# Patient Record
Sex: Female | Born: 1950 | Race: White | Hispanic: No | Smoking: Former smoker
Health system: Southern US, Community
[De-identification: ages and names within clinical notes are randomized; demographics above are authoritative.]

## PROBLEM LIST (undated history)

## (undated) DIAGNOSIS — K219 Gastro-esophageal reflux disease without esophagitis: Secondary | ICD-10-CM

## (undated) DIAGNOSIS — K5790 Diverticulosis of intestine, part unspecified, without perforation or abscess without bleeding: Secondary | ICD-10-CM

## (undated) DIAGNOSIS — T7840XA Allergy, unspecified, initial encounter: Secondary | ICD-10-CM

## (undated) DIAGNOSIS — Z8719 Personal history of other diseases of the digestive system: Secondary | ICD-10-CM

## (undated) DIAGNOSIS — Z9889 Other specified postprocedural states: Secondary | ICD-10-CM

## (undated) DIAGNOSIS — I10 Essential (primary) hypertension: Secondary | ICD-10-CM

## (undated) DIAGNOSIS — M1990 Unspecified osteoarthritis, unspecified site: Secondary | ICD-10-CM

## (undated) DIAGNOSIS — I00 Rheumatic fever without heart involvement: Secondary | ICD-10-CM

## (undated) DIAGNOSIS — R011 Cardiac murmur, unspecified: Secondary | ICD-10-CM

## (undated) DIAGNOSIS — G43909 Migraine, unspecified, not intractable, without status migrainosus: Secondary | ICD-10-CM

## (undated) DIAGNOSIS — R112 Nausea with vomiting, unspecified: Secondary | ICD-10-CM

## (undated) DIAGNOSIS — C4491 Basal cell carcinoma of skin, unspecified: Secondary | ICD-10-CM

## (undated) DIAGNOSIS — I701 Atherosclerosis of renal artery: Secondary | ICD-10-CM

## (undated) DIAGNOSIS — K589 Irritable bowel syndrome without diarrhea: Secondary | ICD-10-CM

## (undated) DIAGNOSIS — M810 Age-related osteoporosis without current pathological fracture: Secondary | ICD-10-CM

## (undated) HISTORY — DX: Rheumatic fever without heart involvement: I00

## (undated) HISTORY — PX: HERNIA REPAIR: SHX51

## (undated) HISTORY — PX: PARATHYROIDECTOMY: SHX19

## (undated) HISTORY — PX: TUBAL LIGATION: SHX77

## (undated) HISTORY — DX: Cardiac murmur, unspecified: R01.1

## (undated) HISTORY — PX: OTHER SURGICAL HISTORY: SHX169

## (undated) HISTORY — DX: Migraine, unspecified, not intractable, without status migrainosus: G43.909

## (undated) HISTORY — DX: Personal history of other diseases of the digestive system: Z87.19

## (undated) HISTORY — PX: SKIN CANCER EXCISION: SHX779

## (undated) HISTORY — DX: Gastro-esophageal reflux disease without esophagitis: K21.9

## (undated) HISTORY — DX: Basal cell carcinoma of skin, unspecified: C44.91

## (undated) HISTORY — DX: Unspecified osteoarthritis, unspecified site: M19.90

## (undated) HISTORY — DX: Diverticulosis of intestine, part unspecified, without perforation or abscess without bleeding: K57.90

## (undated) HISTORY — PX: COLONOSCOPY: SHX174

## (undated) HISTORY — DX: Allergy, unspecified, initial encounter: T78.40XA

## (undated) HISTORY — DX: Other specified postprocedural states: Z98.890

## (undated) HISTORY — PX: DILATION AND CURETTAGE, DIAGNOSTIC / THERAPEUTIC: SUR384

## (undated) HISTORY — PX: DILATION AND CURETTAGE OF UTERUS: SHX78

## (undated) HISTORY — PX: CATARACT EXTRACTION, BILATERAL: SHX1313

## (undated) HISTORY — DX: Irritable bowel syndrome, unspecified: K58.9

## (undated) HISTORY — PX: KNEE SURGERY: SHX244

## (undated) HISTORY — PX: TOE SURGERY: SHX1073

## (undated) HISTORY — PX: UMBILICAL HERNIA REPAIR: SHX196

## (undated) HISTORY — DX: Age-related osteoporosis without current pathological fracture: M81.0

## (undated) HISTORY — PX: UPPER GI ENDOSCOPY: SHX6162

---

## 1980-05-09 HISTORY — PX: APPENDECTOMY: SHX54

## 2009-05-27 LAB — HM COLONOSCOPY

## 2011-05-19 ENCOUNTER — Encounter: Payer: Self-pay | Admitting: Physician Assistant

## 2011-06-10 ENCOUNTER — Encounter: Payer: Self-pay | Admitting: Physician Assistant

## 2012-01-05 ENCOUNTER — Ambulatory Visit: Payer: Self-pay

## 2012-01-18 ENCOUNTER — Ambulatory Visit: Payer: Self-pay | Admitting: Internal Medicine

## 2012-10-02 ENCOUNTER — Encounter: Payer: Self-pay | Admitting: Gynecology

## 2012-10-02 DIAGNOSIS — M8580 Other specified disorders of bone density and structure, unspecified site: Secondary | ICD-10-CM | POA: Insufficient documentation

## 2012-10-05 ENCOUNTER — Ambulatory Visit (INDEPENDENT_AMBULATORY_CARE_PROVIDER_SITE_OTHER): Payer: BC Managed Care – PPO | Admitting: Gynecology

## 2012-10-05 VITALS — BP 122/80 | Resp 14 | Wt 131.0 lb

## 2012-10-05 DIAGNOSIS — M810 Age-related osteoporosis without current pathological fracture: Secondary | ICD-10-CM

## 2012-10-05 DIAGNOSIS — Z01419 Encounter for gynecological examination (general) (routine) without abnormal findings: Secondary | ICD-10-CM

## 2012-10-05 DIAGNOSIS — Z124 Encounter for screening for malignant neoplasm of cervix: Secondary | ICD-10-CM

## 2012-10-05 DIAGNOSIS — N952 Postmenopausal atrophic vaginitis: Secondary | ICD-10-CM

## 2012-10-05 NOTE — Progress Notes (Signed)
62 y.o.   Widowed    Caucasian   female   [id number]   here for annual exam.   Pt here for annual, started effexor for hot flashes but discontinued due to side effects.  Hot flashes tolerable, excercises now include core strength.  Happy with estring, itching better.  No LMP recorded.          Sexually active: no  The current method of family planning is none.    Exercising: regular Last mammogram:  2013 Last pap smear: 2013 History of abnormal pap: no Smoking: no Alcohol:no Last colonoscopy: 2011 Last Bone Density:  2013 Last tetanus shot: unk Last cholesterol check: 2013 BSE: yes  Hgb:                Urine:   Family History  Problem Relation Age of Onset  . Diabetes Mother   . Breast cancer Mother   . Hypertension Mother   . Stroke Mother     Late 70  . Hypertension Father     Patient Active Problem List   Diagnosis Date Noted  . Osteoporosis     Past Medical History  Diagnosis Date  . Osteoporosis     Past Surgical History  Procedure Laterality Date  . Appendectomy  1982  . Umbilical hernia repair    . Tubal ligation    . Dilation and curettage of uterus      x2    Allergies: Codeine and Penicillins  Current Outpatient Prescriptions  Medication Sig Dispense Refill  . Calcium Carbonate (CALTRATE 600 PO) Take by mouth.      . dicyclomine (BENTYL) 10 MG capsule Take 10 mg by mouth 4 (four) times daily -  before meals and at bedtime.      Marland Kitchen estradiol (ESTRING) 2 MG vaginal ring Place 2 mg vaginally every 3 (three) months. follow package directions      . IBUPROFEN PO Take by mouth.      . Multiple Vitamins-Minerals (ALIVE WOMENS 50+ PO) Take by mouth.      . Omega-3 Fatty Acids (FISH OIL PO) Take by mouth.      . Probiotic Product (PROBIOTIC DAILY PO) Take by mouth.      Marland Kitchen UNABLE TO FIND Med Name: Tumeric       No current facility-administered medications for this visit.    ROS: Pertinent items are noted in HPI.  Social Hx:    Exam:    BP 122/80   Resp 14  Wt 131 lb (59.421 kg)   Wt Readings from Last 3 Encounters:  10/05/12 131 lb (59.421 kg)     Ht Readings from Last 3 Encounters:  No data found for Ht    General appearance: alert, cooperative and appears stated age Head: Normocephalic, without obvious abnormality, atraumatic Neck: no adenopathy, supple, symmetrical, trachea midline and thyroid not enlarged, symmetric, no tenderness/mass/nodules Lungs: clear to auscultation bilaterally Breasts: Inspection negative, No nipple retraction or dimpling, No nipple discharge or bleeding, No axillary or supraclavicular adenopathy, Normal to palpation without dominant masses Heart: regular rate and rhythm Abdomen: soft, non-tender; bowel sounds normal; no masses,  no organomegaly Extremities: extremities normal, atraumatic, no cyanosis or edema Skin: Skin color, texture, turgor normal. No rashes or lesions Lymph nodes: Cervical, supraclavicular, and axillary nodes normal. No abnormal inguinal nodes palpated Neurologic: Grossly normal   Pelvic: External genitalia:  no lesions              Urethra:  normal appearing urethra  with no masses, tenderness or lesions              Bartholins and Skenes: normal                 Vagina: normal appearing vagina with normal color and discharge, no lesions              Cervix: normal appearance              Pap taken: yes        Bimanual Exam:  Uterus:  uterus is normal size, shape, consistency and nontender                                      Adnexa: normal adnexa in size, nontender and no masses                                      Rectovaginal: Confirms                                      Anus:  normal sphincter tone, no lesions  A: normal menopausal exam Atrophic vaginitis     P: mammogram qyr pap smear with HRHPV, will hold to get results from [facility] discussed guideline changes,  will continue weight bearing and core exercises Rx given for estring #1x3 return annually or  prn     An After Visit Summary was printed and given to the patient.

## 2012-10-05 NOTE — Patient Instructions (Signed)

## 2012-12-21 ENCOUNTER — Ambulatory Visit (INDEPENDENT_AMBULATORY_CARE_PROVIDER_SITE_OTHER): Payer: BC Managed Care – PPO | Admitting: Internal Medicine

## 2012-12-21 ENCOUNTER — Encounter: Payer: Self-pay | Admitting: Internal Medicine

## 2012-12-21 VITALS — BP 120/80 | HR 65 | Temp 98.4°F | Ht 65.5 in | Wt 137.2 lb

## 2012-12-21 DIAGNOSIS — K5790 Diverticulosis of intestine, part unspecified, without perforation or abscess without bleeding: Secondary | ICD-10-CM

## 2012-12-21 DIAGNOSIS — M810 Age-related osteoporosis without current pathological fracture: Secondary | ICD-10-CM

## 2012-12-21 DIAGNOSIS — M1990 Unspecified osteoarthritis, unspecified site: Secondary | ICD-10-CM

## 2012-12-21 DIAGNOSIS — K5730 Diverticulosis of large intestine without perforation or abscess without bleeding: Secondary | ICD-10-CM

## 2012-12-21 DIAGNOSIS — K589 Irritable bowel syndrome without diarrhea: Secondary | ICD-10-CM

## 2012-12-21 DIAGNOSIS — Z9109 Other allergy status, other than to drugs and biological substances: Secondary | ICD-10-CM

## 2012-12-21 DIAGNOSIS — K219 Gastro-esophageal reflux disease without esophagitis: Secondary | ICD-10-CM

## 2012-12-21 DIAGNOSIS — M129 Arthropathy, unspecified: Secondary | ICD-10-CM

## 2012-12-21 DIAGNOSIS — G43909 Migraine, unspecified, not intractable, without status migrainosus: Secondary | ICD-10-CM

## 2012-12-21 DIAGNOSIS — Z13220 Encounter for screening for lipoid disorders: Secondary | ICD-10-CM

## 2012-12-23 ENCOUNTER — Encounter: Payer: Self-pay | Admitting: Internal Medicine

## 2012-12-23 DIAGNOSIS — M1990 Unspecified osteoarthritis, unspecified site: Secondary | ICD-10-CM | POA: Insufficient documentation

## 2012-12-23 DIAGNOSIS — K219 Gastro-esophageal reflux disease without esophagitis: Secondary | ICD-10-CM | POA: Insufficient documentation

## 2012-12-23 DIAGNOSIS — K589 Irritable bowel syndrome without diarrhea: Secondary | ICD-10-CM | POA: Insufficient documentation

## 2012-12-23 DIAGNOSIS — M069 Rheumatoid arthritis, unspecified: Secondary | ICD-10-CM | POA: Insufficient documentation

## 2012-12-23 DIAGNOSIS — K5790 Diverticulosis of intestine, part unspecified, without perforation or abscess without bleeding: Secondary | ICD-10-CM | POA: Insufficient documentation

## 2012-12-23 DIAGNOSIS — G43909 Migraine, unspecified, not intractable, without status migrainosus: Secondary | ICD-10-CM | POA: Insufficient documentation

## 2012-12-23 DIAGNOSIS — Z9109 Other allergy status, other than to drugs and biological substances: Secondary | ICD-10-CM | POA: Insufficient documentation

## 2012-12-23 NOTE — Assessment & Plan Note (Signed)
Stable.  No symptoms now.  Follow.   

## 2012-12-23 NOTE — Assessment & Plan Note (Signed)
Pain at the base of her thumb.  (right).  Has tried conservative measures including a splint, tylenol and antiinflammatories.  Not helping.  Has seen ortho.  Had xrays.  Told bone on bone.  Needs surgery.  Desires no further intervention at this time.  Follow.

## 2012-12-23 NOTE — Assessment & Plan Note (Signed)
Bowels stable.  

## 2012-12-23 NOTE — Assessment & Plan Note (Signed)
Currently controlled.  Follow.  

## 2012-12-23 NOTE — Assessment & Plan Note (Signed)
On ranitidine.  Has a history of acid reflux.  S/p EGD and dilatation.  Doing better.  Follow.   

## 2012-12-23 NOTE — Progress Notes (Signed)
Subjective:    Patient ID: Victoria Moss, female    DOB: Oct 08, 1950, 62 y.o.   MRN: [id number]  HPI 62 year old female with past history of GERD, arthritis, rheumatic fever and allergies.  She comes in today to follow up on these issues as well as to establish care.  Former pt of Dr Vella Kohler.  Also followed by [facility] - for gyn care.  Has issues with arthritis.  Has had evaluation.  Was told - "bone on bone".  Has tried conservative measures.  Has tried splints and antiinflammatories.  No relief.  Desires no further intervention at this point.  She reports a history of reflux.  Takes ranitidine.  Helps.  Bowels stable.  Has IBS.  Also has a history of diverticulosis.  Currently doing well.  Has osteoporosis.  Discussed treatment options for osteoporosis.  She has been hesitant to take medications given possible side effects of the medications.  We discussed Reclast as a possible treatment options.  She is due a follow up bone density.  Wants to wait and see what her bone density shows - prior to deciding.  She has also had and EGD and is s/p dilatation.  Swallowing better.  Is up to date with her pelvic/paps and mammograms.     Past Medical History  Diagnosis Date  . Osteoporosis   . Arthritis   . GERD (gastroesophageal reflux disease)   . Heart murmur   . Migraine     H/O, none since stopping chocolate  . Allergy   . Diverticulitis     H/O  . Rheumatic fever     H/O  . Basal cell cancer      Outpatient Encounter Prescriptions as of 12/21/2012  Medication Sig Dispense Refill  . Calcium Carbonate (CALTRATE 600 PO) Take by mouth.      . dicyclomine (BENTYL) 10 MG capsule Take 10 mg by mouth 4 (four) times daily -  before meals and at bedtime.      Marland Kitchen estradiol (ESTRING) 2 MG vaginal ring Place 2 mg vaginally every 3 (three) months. follow package directions      . IBUPROFEN PO Take by mouth.      . Multiple Vitamins-Minerals (ALIVE WOMENS 50+ PO) Take by mouth.      .  Omega-3 Fatty Acids (FISH OIL PO) Take by mouth.      . Probiotic Product (PROBIOTIC DAILY PO) Take by mouth.      . ranitidine (ZANTAC) 150 MG tablet Take 150 mg by mouth at bedtime.      Marland Kitchen UNABLE TO FIND Med Name: Tumeric       No facility-administered encounter medications on file as of 12/21/2012.    Review of Systems Patient denies any headache, lightheadedness or dizziness.  No significant sinus or allergy symptoms.  No chest pain, tightness or palpitations.  No increased shortness of breath, cough or congestion.  No nausea or vomiting.  Takes ranitidine for her acid reflux.  Her swallowing is better after dilatation.  No abdominal pain or cramping.  No bowel change.  Has IBS.  Stable.  No BRBPR or melana.  Has had colonoscopy.  No urine change.  Discussed osteoporosis and treatment options as outlined.        Objective:   Physical Exam Filed Vitals:   12/21/12 1528  BP: 120/80  Pulse: 65  Temp: 98.4 F (65.98 C)   62 year old female in no acute distress.   HEENT:  Nares- clear.  Oropharynx - without lesions. NECK:  Supple.  Nontender.  No audible bruit.  HEART:  Appears to be regular. LUNGS:  No crackles or wheezing audible.  Respirations even and unlabored.  RADIAL PULSE:  Equal bilaterally.    BREASTS:  Performed by gyn.   ABDOMEN:  Soft, nontender.  Bowel sounds present and normal.  No audible abdominal bruit.  GU:  [erfpr,ed bu gum/    EXTREMITIES:  No increased edema present.  DP pulses palpable and equal bilaterally.          Assessment & Plan:  HEALTH MAINTENANCE.  Up to date with her pelvic/paps and breast/mammograms.  Followed by gyn.  See records.    I spent 45 minutes with the patient and more than 50% of the time was spent in consultation regarding the above.

## 2012-12-23 NOTE — Assessment & Plan Note (Signed)
Not an issue for her since she stopped chocolate.    

## 2012-12-23 NOTE — Assessment & Plan Note (Signed)
States is due a bone density.  Plans to schedule.  Discussed treatment options.  Discussed reclast (especially given her GI issues).  Wants to wait and review bone density prior to starting.

## 2012-12-26 ENCOUNTER — Telehealth: Payer: Self-pay | Admitting: Internal Medicine

## 2012-12-26 ENCOUNTER — Other Ambulatory Visit (INDEPENDENT_AMBULATORY_CARE_PROVIDER_SITE_OTHER): Payer: BC Managed Care – PPO

## 2012-12-26 DIAGNOSIS — Z13220 Encounter for screening for lipoid disorders: Secondary | ICD-10-CM

## 2012-12-26 DIAGNOSIS — M1990 Unspecified osteoarthritis, unspecified site: Secondary | ICD-10-CM

## 2012-12-26 DIAGNOSIS — K219 Gastro-esophageal reflux disease without esophagitis: Secondary | ICD-10-CM

## 2012-12-26 DIAGNOSIS — M810 Age-related osteoporosis without current pathological fracture: Secondary | ICD-10-CM

## 2012-12-26 DIAGNOSIS — M129 Arthropathy, unspecified: Secondary | ICD-10-CM

## 2012-12-26 LAB — LIPID PANEL
Cholesterol: 170 mg/dL (ref 0–200)
HDL: 63.8 mg/dL (ref >39.00)
LDL Cholesterol: 86 mg/dL (ref 0–99)
Triglycerides: 100 mg/dL (ref 0.0–149.0)
VLDL: 20 mg/dL (ref 0.0–40.0)

## 2012-12-26 LAB — CBC WITH DIFFERENTIAL/PLATELET
Basophils Absolute: 0 10*3/uL (ref 0.0–0.1)
Basophils Relative: 0.5 % (ref 0.0–3.0)
Eosinophils Absolute: 0.2 10*3/uL (ref 0.0–0.7)
Hemoglobin: 13 g/dL (ref 12.0–15.0)
MCHC: 34.2 g/dL (ref 30.0–36.0)
MCV: 91.3 fl (ref 78.0–100.0)
Monocytes Absolute: 0.3 10*3/uL (ref 0.1–1.0)
Neutro Abs: 2.5 10*3/uL (ref 1.4–7.7)
Neutrophils Relative %: 61 % (ref 43.0–77.0)
RBC: 4.17 Mil/uL (ref 3.87–5.11)
RDW: 13.1 % (ref 11.5–14.6)

## 2012-12-26 LAB — COMPREHENSIVE METABOLIC PANEL
AST: 19 U/L (ref 0–37)
Alkaline Phosphatase: 86 U/L (ref 39–117)
BUN: 11 mg/dL (ref 6–23)
Creatinine, Ser: 0.7 mg/dL (ref 0.4–1.2)
Total Bilirubin: 0.7 mg/dL (ref 0.3–1.2)

## 2012-12-26 NOTE — Telephone Encounter (Signed)
Okay to fill? Pt seen on 8/15 (new patient)

## 2012-12-26 NOTE — Telephone Encounter (Signed)
Pt is needing refill on Dicyclomine 10 mg capsules 3 month supply. Pt needs it hand written and mailed to her if possible.

## 2012-12-26 NOTE — Telephone Encounter (Signed)
Pt states that she typically takes about 20-30 per month. But she gets a 90 supply through a health warehouse at a discounted price (she has no prescription plan). Would like to pick up a Rx on Tuesday (If that is okay-no need to call patient back)

## 2012-12-26 NOTE — Telephone Encounter (Signed)
How often does she take this?  I was under the impression this was just prn.  I just need to know, so that I will know how many to give her.  Thanks.

## 2012-12-30 MED ORDER — DICYCLOMINE HCL 10 MG PO CAPS: ORAL_CAPSULE | ORAL | Status: DC

## 2012-12-30 NOTE — Addendum Note (Signed)
Addended by: Charm Barges on: 12/30/2012 05:22 PM   Modules accepted: Orders

## 2012-12-30 NOTE — Telephone Encounter (Signed)
Refilled dicyclomine #90 with one refill.   rx in your basket

## 2012-12-31 NOTE — Telephone Encounter (Signed)
Rx placed up front for pt to p/u on Tuesday

## 2013-01-01 ENCOUNTER — Encounter: Payer: Self-pay | Admitting: *Deleted

## 2013-01-02 ENCOUNTER — Telehealth: Payer: Self-pay | Admitting: Gynecology

## 2013-01-02 NOTE — Telephone Encounter (Signed)
Spoke to pt about last MMG. Pt had it done in [place] last year, but she cannot remember exactly when. Pt prefers to have it done in [place]. Advised pt to call and see when her last appt was and make her new appt at least 365 days from the last one for insurance purposes. Pt will have results sent to Dr. Farrel Gobble. Pt will call back if she has problems making appt.

## 2013-01-02 NOTE — Telephone Encounter (Signed)
Patient received letter stating that it was time for her scheduled mammogram. Please call to let her know when and where to go .

## 2013-01-18 ENCOUNTER — Ambulatory Visit: Payer: Self-pay

## 2013-01-18 LAB — HM MAMMOGRAPHY: HM Mammogram: NEGATIVE

## 2013-02-04 ENCOUNTER — Ambulatory Visit (INDEPENDENT_AMBULATORY_CARE_PROVIDER_SITE_OTHER): Payer: BC Managed Care – PPO

## 2013-02-04 DIAGNOSIS — Z23 Encounter for immunization: Secondary | ICD-10-CM

## 2013-03-19 ENCOUNTER — Encounter: Payer: Self-pay | Admitting: Adult Health

## 2013-03-19 ENCOUNTER — Ambulatory Visit (INDEPENDENT_AMBULATORY_CARE_PROVIDER_SITE_OTHER): Payer: BC Managed Care – PPO | Admitting: Adult Health

## 2013-03-19 VITALS — BP 138/80 | HR 76 | Resp 16 | Wt 138.0 lb

## 2013-03-19 DIAGNOSIS — M542 Cervicalgia: Secondary | ICD-10-CM | POA: Insufficient documentation

## 2013-03-19 MED ORDER — CYCLOBENZAPRINE HCL 5 MG PO TABS: 5.0000 mg | ORAL_TABLET | Freq: Three times a day (TID) | ORAL | Status: DC | PRN

## 2013-03-19 NOTE — Progress Notes (Signed)
Pre visit review using our clinic review tool, if applicable. No additional management support is needed unless otherwise documented below in the visit note. 

## 2013-03-19 NOTE — Assessment & Plan Note (Signed)
Tight trapezius muscle on the left. ? Improper posture during sleep. Ice alternating with heat. NSAIDs x 3-4 days. Flexeril for muscle spasms. Stretching. RTC if no improvement within 1 week.

## 2013-03-19 NOTE — Patient Instructions (Signed)
  Take ibuprofen or aleve regularly for 3-4 days to reduce inflammation.  Ice alternating with heat 20 min 3-4 times daily.  Stretching exercises will also help.  Flexeril for muscle spasms. This will cause some sedation. Do not drive while taking medication.  Good support pillow.

## 2013-03-19 NOTE — Progress Notes (Signed)
  Subjective:    Patient ID: Victoria Moss, female    DOB: [birth year], 62 y.o.   MRN: [id number]  HPI  Patient is a pleasant 62 year old female who presents to clinic with pain on left side of her neck. She reports the pain began Tuesday morning when she first woke up. She thinks that she slept in a bad position. She has taken ibuprofen for inflammation; however, she has not consistently done so. Patient has started participating in yoga. She reports that this is beginners yoga and does not feel that this has contributed to her current problem.   Current Outpatient Prescriptions on File Prior to Visit  Medication Sig Dispense Refill  . Calcium Carbonate (CALTRATE 600 PO) Take by mouth.      . dicyclomine (BENTYL) 10 MG capsule One capsule q day prn  90 capsule  1  . estradiol (ESTRING) 2 MG vaginal ring Place 2 mg vaginally every 3 (three) months. follow package directions      . IBUPROFEN PO Take by mouth.      . Multiple Vitamins-Minerals (ALIVE WOMENS 50+ PO) Take by mouth.      . Omega-3 Fatty Acids (FISH OIL PO) Take by mouth.      . Probiotic Product (PROBIOTIC DAILY PO) Take by mouth.      . ranitidine (ZANTAC) 150 MG tablet Take 150 mg by mouth at bedtime.      Marland Kitchen UNABLE TO FIND Med Name: Tumeric       No current facility-administered medications on file prior to visit.     Review of Systems  Musculoskeletal: Positive for neck pain.       Left side neck and shoulder tightness, pain with turning head.       Objective:   Physical Exam  Constitutional: She is oriented to person, place, and time.  Musculoskeletal: She exhibits tenderness. She exhibits no edema.  Tightness of left side trapezius muscle from neck to shoulder  Neurological: She is alert and oriented to person, place, and time.          Assessment & Plan:

## 2013-05-16 ENCOUNTER — Encounter (INDEPENDENT_AMBULATORY_CARE_PROVIDER_SITE_OTHER): Payer: Self-pay

## 2013-05-16 ENCOUNTER — Other Ambulatory Visit: Payer: Self-pay | Admitting: Internal Medicine

## 2013-05-16 ENCOUNTER — Ambulatory Visit (INDEPENDENT_AMBULATORY_CARE_PROVIDER_SITE_OTHER): Payer: BC Managed Care – PPO | Admitting: Internal Medicine

## 2013-05-16 ENCOUNTER — Encounter: Payer: Self-pay | Admitting: Internal Medicine

## 2013-05-16 VITALS — BP 122/82 | HR 68 | Temp 98.1°F | Ht 65.5 in | Wt 139.8 lb

## 2013-05-16 DIAGNOSIS — M810 Age-related osteoporosis without current pathological fracture: Secondary | ICD-10-CM

## 2013-05-16 DIAGNOSIS — K5790 Diverticulosis of intestine, part unspecified, without perforation or abscess without bleeding: Secondary | ICD-10-CM

## 2013-05-16 DIAGNOSIS — Z9109 Other allergy status, other than to drugs and biological substances: Secondary | ICD-10-CM

## 2013-05-16 DIAGNOSIS — K589 Irritable bowel syndrome without diarrhea: Secondary | ICD-10-CM

## 2013-05-16 DIAGNOSIS — M1990 Unspecified osteoarthritis, unspecified site: Secondary | ICD-10-CM

## 2013-05-16 DIAGNOSIS — K5730 Diverticulosis of large intestine without perforation or abscess without bleeding: Secondary | ICD-10-CM

## 2013-05-16 DIAGNOSIS — D72819 Decreased white blood cell count, unspecified: Secondary | ICD-10-CM

## 2013-05-16 DIAGNOSIS — K219 Gastro-esophageal reflux disease without esophagitis: Secondary | ICD-10-CM

## 2013-05-16 DIAGNOSIS — M129 Arthropathy, unspecified: Secondary | ICD-10-CM

## 2013-05-16 DIAGNOSIS — G43909 Migraine, unspecified, not intractable, without status migrainosus: Secondary | ICD-10-CM

## 2013-05-16 LAB — CBC WITH DIFFERENTIAL/PLATELET
BASOS ABS: 0 10*3/uL (ref 0.0–0.1)
Basophils Relative: 0.4 % (ref 0.0–3.0)
EOS ABS: 0.3 10*3/uL (ref 0.0–0.7)
Eosinophils Relative: 4.3 % (ref 0.0–5.0)
HCT: 36.7 % (ref 36.0–46.0)
Hemoglobin: 12.6 g/dL (ref 12.0–15.0)
Lymphocytes Relative: 24.2 % (ref 12.0–46.0)
Lymphs Abs: 1.5 10*3/uL (ref 0.7–4.0)
MCHC: 34.4 g/dL (ref 30.0–36.0)
MCV: 90.2 fl (ref 78.0–100.0)
MONO ABS: 0.3 10*3/uL (ref 0.1–1.0)
Monocytes Relative: 4.9 % (ref 3.0–12.0)
NEUTROS PCT: 66.2 % (ref 43.0–77.0)
Neutro Abs: 4.1 10*3/uL (ref 1.4–7.7)
PLATELETS: 281 10*3/uL (ref 150.0–400.0)
RBC: 4.07 Mil/uL (ref 3.87–5.11)
RDW: 12.4 % (ref 11.5–14.6)
WBC: 6.2 10*3/uL (ref 4.5–10.5)

## 2013-05-16 NOTE — Assessment & Plan Note (Addendum)
States is due a bone density.  Discussed treatment options.  Discussed reclast (especially given her GI issues).  Schedule bone density.

## 2013-05-16 NOTE — Assessment & Plan Note (Addendum)
Exercising.  Yoga.  Desires no further intervention.    

## 2013-05-16 NOTE — Assessment & Plan Note (Signed)
Bowels stable.  

## 2013-05-16 NOTE — Progress Notes (Signed)
Pre-visit discussion using our clinic review tool. No additional management support is needed unless otherwise documented below in the visit note.  

## 2013-05-16 NOTE — Assessment & Plan Note (Addendum)
On ranitidine.  Has a history of acid reflux.  S/p EGD and dilatation.  Doing better.  Follow.   

## 2013-05-16 NOTE — Progress Notes (Signed)
Subjective:    Patient ID: Victoria Moss, female    DOB: [birth year], 63 y.o.   MRN: [id number]  HPI 63 year old female with past history of GERD, arthritis, rheumatic fever and allergies.  She comes in today for a scheduled follow up.   Has been followed by [facility] - for gyn care.  Has issues with arthritis.  Has had evaluation.  Was told - "bone on bone".  Has tried conservative measures.  Has tried splints and antiinflammatories.  No relief.  Desires no further intervention at this point.  Has started a yoga class.  Exercises three days per week.  She reports a history of reflux.  Takes ranitidine.  Helps.  Bowels stable.  Has IBS.  Also has a history of diverticulosis.  Currently doing well.  Has osteoporosis.  Discussed treatment options for osteoporosis.  She has been hesitant to take medications given possible side effects of the medications.  We discussed Reclast as a possible treatment options.  She is due a follow up bone density.  She has also had and EGD and is s/p dilatation.  Swallowing better.  Is up to date with her pelvic/paps and mammograms.   Previously used Estring.  Felt better.  Off now.  Does not feel needs to be back on estrogen.  Had colonoscopy four years ago..    Past Medical History  Diagnosis Date  . Osteoporosis   . Arthritis   . GERD (gastroesophageal reflux disease)   . Heart murmur   . Migraine     H/O, none since stopping chocolate  . Allergy   . Diverticulitis     H/O  . Rheumatic fever     H/O  . Basal cell cancer      Outpatient Encounter Prescriptions as of 05/16/2013  Medication Sig  . Calcium Carbonate (CALTRATE 600 PO) Take by mouth.  . dicyclomine (BENTYL) 10 MG capsule One capsule q day prn  . IBUPROFEN PO Take by mouth.  . Multiple Vitamins-Minerals (ALIVE WOMENS 50+ PO) Take by mouth.  . Omega-3 Fatty Acids (FISH OIL PO) Take by mouth.  . Probiotic Product (PROBIOTIC DAILY PO) Take by mouth.  . ranitidine (ZANTAC) 150 MG tablet Take  150 mg by mouth at bedtime.  Marland Kitchen UNABLE TO FIND Med Name: Tumeric  . [DISCONTINUED] cyclobenzaprine (FLEXERIL) 5 MG tablet Take 1 tablet (5 mg total) by mouth 3 (three) times daily as needed for muscle spasms.  . [DISCONTINUED] estradiol (ESTRING) 2 MG vaginal ring Place 2 mg vaginally every 3 (three) months. follow package directions    Review of Systems Patient denies any headache, lightheadedness or dizziness.  No significant sinus or allergy symptoms.  No chest pain, tightness or palpitations.  No increased shortness of breath, cough or congestion.  No nausea or vomiting.  Takes ranitidine for her acid reflux.  Her swallowing is better after dilatation.  No abdominal pain or cramping.  No bowel change.  Has IBS.  Stable.  No BRBPR or melana.  Has had colonoscopy.  No urine change.  Discussed osteoporosis and treatment options as outlined.   Exercising.  Going to a yoga class.  Overall she feels she is doing relatively well.      Objective:   Physical Exam  Filed Vitals:   05/16/13 1406  BP: 122/82  Pulse: 68  Temp: 98.1 F (26.29 C)   63 year old female in no acute distress.   HEENT:  Nares- clear.  Oropharynx - without lesions. NECK:  Supple.  Nontender.  No audible bruit.  HEART:  Appears to be regular. LUNGS:  No crackles or wheezing audible.  Respirations even and unlabored.  RADIAL PULSE:  Equal bilaterally.   ABDOMEN:  Soft, nontender.  Bowel sounds present and normal.  No audible abdominal bruit.    EXTREMITIES:  No increased edema present.  DP pulses palpable and equal bilaterally.          Assessment & Plan:  HEALTH MAINTENANCE.  Up to date with her pelvic/paps and breast/mammograms.  Followed by gyn.  See records.    I spent 25 minutes with the patient and more than 50% of the time was spent in consultation regarding the above.

## 2013-05-16 NOTE — Progress Notes (Signed)
Order placed for f/u cbc.   

## 2013-05-16 NOTE — Assessment & Plan Note (Addendum)
Stable.  No symptoms now.  Follow.   

## 2013-05-17 ENCOUNTER — Encounter: Payer: Self-pay | Admitting: *Deleted

## 2013-05-17 ENCOUNTER — Telehealth: Payer: Self-pay | Admitting: *Deleted

## 2013-05-17 NOTE — Telephone Encounter (Signed)
Mailed lab results. 

## 2013-05-19 ENCOUNTER — Encounter: Payer: Self-pay | Admitting: Internal Medicine

## 2013-05-19 DIAGNOSIS — D72819 Decreased white blood cell count, unspecified: Secondary | ICD-10-CM | POA: Insufficient documentation

## 2013-05-19 NOTE — Assessment & Plan Note (Signed)
Recheck cbc today.   

## 2013-05-19 NOTE — Assessment & Plan Note (Signed)
Not an issue for her since she stopped chocolate.    

## 2013-05-19 NOTE — Assessment & Plan Note (Signed)
Currently controlled.  Follow.  

## 2013-07-02 ENCOUNTER — Encounter: Payer: Self-pay | Admitting: Internal Medicine

## 2013-07-02 ENCOUNTER — Telehealth: Payer: Self-pay | Admitting: Internal Medicine

## 2013-07-02 DIAGNOSIS — Z83719 Family history of colon polyps, unspecified: Secondary | ICD-10-CM

## 2013-07-02 DIAGNOSIS — M810 Age-related osteoporosis without current pathological fracture: Secondary | ICD-10-CM

## 2013-07-02 DIAGNOSIS — K219 Gastro-esophageal reflux disease without esophagitis: Secondary | ICD-10-CM

## 2013-07-02 DIAGNOSIS — Z8371 Family history of colonic polyps: Secondary | ICD-10-CM | POA: Insufficient documentation

## 2013-07-02 NOTE — Telephone Encounter (Signed)
Notify pt that I received her bone density from [facility].  This was done on 10/04/11.  Will be due f/u bone density 5-6/15.  Also, she needs a physical scheduled in 7/15.  I will go ahead and schedule the bone density for 5-6/15.

## 2013-07-03 NOTE — Telephone Encounter (Signed)
Left detailed voicemail with information & asked pt to call back & schedule physical in July

## 2013-08-12 ENCOUNTER — Telehealth: Payer: Self-pay | Admitting: Internal Medicine

## 2013-08-12 NOTE — Telephone Encounter (Signed)
Called stating she is ready to set up bone density test.  States this was to be done in May.  Anyday, anytime after 9 a.m. Is okay.  Cannot do it the last week of May.

## 2013-08-13 NOTE — Telephone Encounter (Signed)
Order printed-arb 

## 2013-09-19 ENCOUNTER — Telehealth: Payer: Self-pay | Admitting: Internal Medicine

## 2013-10-25 ENCOUNTER — Encounter: Payer: Self-pay | Admitting: *Deleted

## 2013-10-25 LAB — HM DEXA SCAN

## 2013-10-31 ENCOUNTER — Encounter: Payer: Self-pay | Admitting: Internal Medicine

## 2013-11-13 ENCOUNTER — Ambulatory Visit (INDEPENDENT_AMBULATORY_CARE_PROVIDER_SITE_OTHER): Payer: BC Managed Care – PPO | Admitting: Internal Medicine

## 2013-11-13 ENCOUNTER — Encounter: Payer: Self-pay | Admitting: Internal Medicine

## 2013-11-13 VITALS — BP 110/70 | HR 66 | Temp 98.2°F | Ht 66.0 in | Wt 139.5 lb

## 2013-11-13 DIAGNOSIS — M810 Age-related osteoporosis without current pathological fracture: Secondary | ICD-10-CM

## 2013-11-13 DIAGNOSIS — Z8371 Family history of colonic polyps: Secondary | ICD-10-CM

## 2013-11-13 DIAGNOSIS — M25552 Pain in left hip: Secondary | ICD-10-CM | POA: Insufficient documentation

## 2013-11-13 DIAGNOSIS — G43809 Other migraine, not intractable, without status migrainosus: Secondary | ICD-10-CM

## 2013-11-13 DIAGNOSIS — Z1239 Encounter for other screening for malignant neoplasm of breast: Secondary | ICD-10-CM

## 2013-11-13 DIAGNOSIS — M129 Arthropathy, unspecified: Secondary | ICD-10-CM

## 2013-11-13 DIAGNOSIS — K589 Irritable bowel syndrome without diarrhea: Secondary | ICD-10-CM

## 2013-11-13 DIAGNOSIS — M1990 Unspecified osteoarthritis, unspecified site: Secondary | ICD-10-CM

## 2013-11-13 DIAGNOSIS — K219 Gastro-esophageal reflux disease without esophagitis: Secondary | ICD-10-CM

## 2013-11-13 DIAGNOSIS — Z9109 Other allergy status, other than to drugs and biological substances: Secondary | ICD-10-CM

## 2013-11-13 DIAGNOSIS — K5730 Diverticulosis of large intestine without perforation or abscess without bleeding: Secondary | ICD-10-CM

## 2013-11-13 DIAGNOSIS — D72819 Decreased white blood cell count, unspecified: Secondary | ICD-10-CM

## 2013-11-13 DIAGNOSIS — J029 Acute pharyngitis, unspecified: Secondary | ICD-10-CM

## 2013-11-13 LAB — URINALYSIS, ROUTINE W REFLEX MICROSCOPIC
BILIRUBIN URINE: NEGATIVE
HGB URINE DIPSTICK: NEGATIVE
Ketones, ur: NEGATIVE
Leukocytes, UA: NEGATIVE
Nitrite: NEGATIVE
RBC / HPF: NONE SEEN (ref 0–?)
Specific Gravity, Urine: <=1.005 — AB (ref 1.000–1.030)
TOTAL PROTEIN, URINE-UPE24: NEGATIVE
URINE GLUCOSE: NEGATIVE
UROBILINOGEN UA: 0.2 (ref 0.0–1.0)
WBC, UA: NONE SEEN (ref 0–?)
pH: 6.5 (ref 5.0–8.0)

## 2013-11-13 LAB — CBC WITH DIFFERENTIAL/PLATELET
BASOS PCT: 0.3 % (ref 0.0–3.0)
Basophils Absolute: 0 10*3/uL (ref 0.0–0.1)
EOS PCT: 3.2 % (ref 0.0–5.0)
Eosinophils Absolute: 0.2 10*3/uL (ref 0.0–0.7)
HEMATOCRIT: 37.9 % (ref 36.0–46.0)
HEMOGLOBIN: 12.8 g/dL (ref 12.0–15.0)
LYMPHS PCT: 11.4 % — AB (ref 12.0–46.0)
Lymphs Abs: 0.7 10*3/uL (ref 0.7–4.0)
MCHC: 33.8 g/dL (ref 30.0–36.0)
MCV: 90.7 fl (ref 78.0–100.0)
MONOS PCT: 5.8 % (ref 3.0–12.0)
Monocytes Absolute: 0.4 10*3/uL (ref 0.1–1.0)
NEUTROS ABS: 5.2 10*3/uL (ref 1.4–7.7)
Neutrophils Relative %: 79.3 % — ABNORMAL HIGH (ref 43.0–77.0)
Platelets: 218 10*3/uL (ref 150.0–400.0)
RBC: 4.18 Mil/uL (ref 3.87–5.11)
RDW: 13 % (ref 11.5–15.5)
WBC: 6.6 10*3/uL (ref 4.0–10.5)

## 2013-11-13 LAB — COMPREHENSIVE METABOLIC PANEL
ALT: 16 U/L (ref 0–35)
AST: 21 U/L (ref 0–37)
Albumin: 4.2 g/dL (ref 3.5–5.2)
Alkaline Phosphatase: 85 U/L (ref 39–117)
BUN: 10 mg/dL (ref 6–23)
CALCIUM: 10.8 mg/dL — AB (ref 8.4–10.5)
CHLORIDE: 103 meq/L (ref 96–112)
CO2: 28 mEq/L (ref 19–32)
CREATININE: 0.6 mg/dL (ref 0.4–1.2)
GFR: 105.11 mL/min (ref >60.00)
GLUCOSE: 83 mg/dL (ref 70–99)
Potassium: 4.7 mEq/L (ref 3.5–5.1)
Sodium: 137 mEq/L (ref 135–145)
Total Bilirubin: 0.5 mg/dL (ref 0.2–1.2)
Total Protein: 6.9 g/dL (ref 6.0–8.3)

## 2013-11-13 LAB — VITAMIN D 25 HYDROXY (VIT D DEFICIENCY, FRACTURES): VITD: 35.25 ng/mL

## 2013-11-13 LAB — TSH: TSH: 0.5 u[IU]/mL (ref 0.35–4.50)

## 2013-11-13 MED ORDER — AZITHROMYCIN 250 MG PO TABS: ORAL_TABLET | ORAL | Status: DC

## 2013-11-13 NOTE — Patient Instructions (Signed)
Afrin nasal spray - 2 sprays each nostril two times per day for 3 days only then stop.     Nasacort nasal spray - 2 sprays each nostril one time per day - do this in the evening.

## 2013-11-13 NOTE — Progress Notes (Signed)
Pre visit review using our clinic review tool, if applicable. No additional management support is needed unless otherwise documented below in the visit note. 

## 2013-11-14 ENCOUNTER — Encounter: Payer: Self-pay | Admitting: *Deleted

## 2013-11-14 ENCOUNTER — Other Ambulatory Visit: Payer: Self-pay | Admitting: Internal Medicine

## 2013-11-14 NOTE — Progress Notes (Signed)
Order placed for f/u calcium check.  

## 2013-11-15 LAB — CULTURE, GROUP A STREP: Organism ID, Bacteria: NORMAL

## 2013-11-17 ENCOUNTER — Encounter: Payer: Self-pay | Admitting: Internal Medicine

## 2013-11-17 NOTE — Assessment & Plan Note (Signed)
Colonoscopy as outlined.    

## 2013-11-17 NOTE — Progress Notes (Signed)
Subjective:    Patient ID: Brianne Maina, female    DOB: Mar 12, 1951, 63 y.o.   MRN: [id number]  Sore Throat   63 year old female with past history of GERD, arthritis, rheumatic fever and allergies.  She comes in today to follow up on these issues as well as for a complete physical exam.   Has issues with arthritis.  Has had evaluation.  Was told - "bone on bone".  Has tried conservative measures.  Has tried splints and antiinflammatories.  No relief.  Desires no further intervention at this point.  Exercises three days per week.  She reports a history of reflux.  Takes ranitidine.  Helps.  Bowels stable.  Has IBS.  Also has a history of diverticulosis.  Currently doing well.  Has osteoporosis.  Just had follow up bone density.  Unchanged.  Did reveal osteoporosis.  Discussed treatment options for osteoporosis.  She has been hesitant to take medications given possible side effects of the medications.  We discussed Reclast as a possible treatment option.  She was agreeable.  She has also had and EGD and is s/p dilatation.  Swallowing better.   Had colonoscopy four years ago.  Her main complaint today is that of a sore throat.  Started over the weekend.  Some minimal cough.  Ears hurt.  Some drainage.  Some coughing.  Some nausea from the drainage.  Eating and drinking well.     Past Medical History  Diagnosis Date  . Osteoporosis   . Arthritis   . GERD (gastroesophageal reflux disease)   . Heart murmur   . Migraine     H/O, none since stopping chocolate  . Allergy   . Diverticulitis     H/O  . Rheumatic fever     H/O  . Basal cell cancer      Outpatient Encounter Prescriptions as of 11/13/2013  Medication Sig  . Calcium Carbonate (CALTRATE 600 PO) Take by mouth.  . dicyclomine (BENTYL) 10 MG capsule One capsule q day prn  . IBUPROFEN PO Take by mouth.  . Multiple Vitamins-Minerals (ALIVE WOMENS 50+ PO) Take by mouth.  . Omega-3 Fatty Acids (FISH OIL PO) Take by mouth.  . Probiotic  Product (PROBIOTIC DAILY PO) Take by mouth.  Marland Kitchen UNABLE TO FIND Med Name: Tumeric  . azithromycin (ZITHROMAX) 250 MG tablet Take 2 tablets x 1 day and then one po q day x 4 days.  . [DISCONTINUED] ranitidine (ZANTAC) 150 MG tablet Take 150 mg by mouth at bedtime.    Review of Systems Patient denies any headache, lightheadedness or dizziness.  Sore throat, congestion and drainage as outlined.   No chest pain, tightness or palpitations.  No increased shortness of breath.  No vomiting.  Some nausea secondary to drainage. Takes ranitidine for her acid reflux.  Her swallowing is better after dilatation.  No abdominal pain or cramping.  No bowel change.  Has IBS.  Stable.  No BRBPR or melana.  Has had colonoscopy.  No urine change.  Discussed osteoporosis and treatment options as outlined.   Exercising.  Going to a yoga class.  Overall she feels she is doing relatively well.      Objective:   Physical Exam  Filed Vitals:   11/13/13 0924  BP: 110/70  Pulse: 66  Temp: 98.2 F (56.48 C)   63 year old female in no acute distress.   HEENT:  Nares- clear.  Oropharynx - without lesions. NECK:  Supple.  Nontender.  No  audible bruit.  HEART:  Appears to be regular. LUNGS:  No crackles or wheezing audible.  Respirations even and unlabored.  RADIAL PULSE:  Equal bilaterally.    BREASTS:  No nipple discharge or nipple retraction present.  Could not appreciate any distinct nodules or axillary adenopathy.  ABDOMEN:  Soft, nontender.  Bowel sounds present and normal.  No audible abdominal bruit.  GU:  Not performed.    EXTREMITIES:  No increased edema present.  DP pulses palpable and equal bilaterally.          Assessment & Plan:  HEALTH MAINTENANCE.  Physical today.  Pap 11/23/11 negative with negative HPV.  Schedule mammogram.  Had colonoscopy four years ago.     I spent 25 minutes with the patient and more than 50% of the time was spent in consultation regarding the above.

## 2013-11-17 NOTE — Assessment & Plan Note (Signed)
Bone density unchanged.  Osteoprosis.  Discussed treatment options.  Discussed reclast (especially given her GI issues).  Will complete form for authorization for Reclast.

## 2013-11-17 NOTE — Assessment & Plan Note (Signed)
On ranitidine.  Has a history of acid reflux.  S/p EGD and dilatation.  Doing better.  Follow.

## 2013-11-17 NOTE — Assessment & Plan Note (Signed)
Follow cbc.  

## 2013-11-17 NOTE — Assessment & Plan Note (Signed)
Currently controlled.  Follow.  

## 2013-11-17 NOTE — Assessment & Plan Note (Signed)
Stable.  No symptoms now.  Follow.

## 2013-11-17 NOTE — Assessment & Plan Note (Signed)
Exercising.  Yoga.  Desires no further intervention.

## 2013-11-17 NOTE — Assessment & Plan Note (Signed)
Not an issue for her since she stopped chocolate.

## 2013-11-17 NOTE — Assessment & Plan Note (Signed)
Bowels stable.  

## 2013-11-17 NOTE — Assessment & Plan Note (Signed)
Symptoms as outlined.  Throat culture.  Treat with zpak as directed.  Gargle with salt water.  Follow.

## 2013-11-18 ENCOUNTER — Telehealth: Payer: Self-pay | Admitting: *Deleted

## 2013-11-18 NOTE — Telephone Encounter (Signed)
If having persistent problems with her ear despite our treatment, I would like to refer to ENT for evaluation.

## 2013-11-18 NOTE — Telephone Encounter (Signed)
Spoke with pt, she states she is ok being referred to ENT.

## 2013-11-18 NOTE — Telephone Encounter (Signed)
Pt called states she was seen 7.8.15, Rx'd ZPac and 2 nasal sprays.  She further states her ear is still stopped up.  Please advise

## 2013-11-19 ENCOUNTER — Other Ambulatory Visit: Payer: Self-pay | Admitting: Internal Medicine

## 2013-11-19 DIAGNOSIS — H938X9 Other specified disorders of ear, unspecified ear: Secondary | ICD-10-CM

## 2013-11-19 NOTE — Telephone Encounter (Signed)
Order placed for ENT referral.   

## 2013-11-19 NOTE — Progress Notes (Signed)
Order placed for ENT referral.  Per phone message pt agreeable to referral.

## 2013-11-25 ENCOUNTER — Other Ambulatory Visit (INDEPENDENT_AMBULATORY_CARE_PROVIDER_SITE_OTHER): Payer: BC Managed Care – PPO

## 2013-11-25 LAB — CALCIUM: Calcium: 9.8 mg/dL (ref 8.4–10.5)

## 2013-12-02 ENCOUNTER — Telehealth: Payer: Self-pay | Admitting: *Deleted

## 2013-12-02 NOTE — Telephone Encounter (Signed)
See if she can come in at 9:00 tomorrow (block 30 minute appt).  Need records from ENT prior to appt.

## 2013-12-02 NOTE — Telephone Encounter (Signed)
Pt called states she is having continued left breast pain.  States she has seen ENT however is still having ear pain, and cough.  Pt is requesting to be seen tomorrow.  Please advise whether I may use an a.m. Appointment.

## 2013-12-02 NOTE — Telephone Encounter (Signed)
Spoke with pt advised of MDs message to come in 7.28.15 for work in at noon.  Pt is calling [facility] to request records from last week.

## 2013-12-03 ENCOUNTER — Encounter: Payer: Self-pay | Admitting: Internal Medicine

## 2013-12-03 ENCOUNTER — Ambulatory Visit (INDEPENDENT_AMBULATORY_CARE_PROVIDER_SITE_OTHER): Payer: BC Managed Care – PPO | Admitting: Internal Medicine

## 2013-12-03 VITALS — BP 120/70 | HR 60 | Temp 98.2°F | Ht 66.0 in | Wt 138.0 lb

## 2013-12-03 DIAGNOSIS — R059 Cough, unspecified: Secondary | ICD-10-CM

## 2013-12-03 DIAGNOSIS — Z83719 Family history of colon polyps, unspecified: Secondary | ICD-10-CM

## 2013-12-03 DIAGNOSIS — H938X9 Other specified disorders of ear, unspecified ear: Secondary | ICD-10-CM

## 2013-12-03 DIAGNOSIS — R05 Cough: Secondary | ICD-10-CM

## 2013-12-03 DIAGNOSIS — H938X2 Other specified disorders of left ear: Secondary | ICD-10-CM

## 2013-12-03 DIAGNOSIS — K589 Irritable bowel syndrome without diarrhea: Secondary | ICD-10-CM

## 2013-12-03 DIAGNOSIS — Z8371 Family history of colonic polyps: Secondary | ICD-10-CM

## 2013-12-03 DIAGNOSIS — K219 Gastro-esophageal reflux disease without esophagitis: Secondary | ICD-10-CM

## 2013-12-03 DIAGNOSIS — N644 Mastodynia: Secondary | ICD-10-CM

## 2013-12-03 MED ORDER — RANITIDINE HCL 150 MG PO TABS: ORAL_TABLET | ORAL | Status: DC

## 2013-12-03 MED ORDER — OMEPRAZOLE 20 MG PO CPDR
20.0000 mg | DELAYED_RELEASE_CAPSULE | Freq: Every day | ORAL | Status: DC
Start: 2013-12-03 — End: 2014-09-11

## 2013-12-03 NOTE — Progress Notes (Signed)
Pre visit review using our clinic review tool, if applicable. No additional management support is needed unless otherwise documented below in the visit note. 

## 2013-12-04 ENCOUNTER — Telehealth: Payer: Self-pay | Admitting: Internal Medicine

## 2013-12-04 ENCOUNTER — Encounter: Payer: Self-pay | Admitting: Internal Medicine

## 2013-12-04 NOTE — Telephone Encounter (Signed)
Pt dropped off mammogram results. In Dr. Bary Leriche box.msn

## 2013-12-04 NOTE — Telephone Encounter (Signed)
Abstracted & placed in Dr. Bary Leriche folder

## 2013-12-08 ENCOUNTER — Encounter: Payer: Self-pay | Admitting: Internal Medicine

## 2013-12-08 DIAGNOSIS — R05 Cough: Secondary | ICD-10-CM | POA: Insufficient documentation

## 2013-12-08 DIAGNOSIS — R059 Cough, unspecified: Secondary | ICD-10-CM | POA: Insufficient documentation

## 2013-12-08 DIAGNOSIS — N644 Mastodynia: Secondary | ICD-10-CM | POA: Insufficient documentation

## 2013-12-08 DIAGNOSIS — H938X9 Other specified disorders of ear, unspecified ear: Secondary | ICD-10-CM | POA: Insufficient documentation

## 2013-12-08 NOTE — Assessment & Plan Note (Signed)
Left breast tenderness as outlined.  Schedule diagnostic mammogram.  Will see if can go ahead and do bilateral mammogram, since due soon for bilateral mammogram.

## 2013-12-08 NOTE — Progress Notes (Signed)
Subjective:    Patient ID: Victoria Moss, female    DOB: 03-01-1951, 63 y.o.   MRN: [id number]  HPI 63 year old female with past history of GERD, arthritis, rheumatic fever and allergies.  She comes in today as a work in to discuss her concerns regarding persistent congestion and cough.  Decreased hearing - left ear.  Feels has water in her ear.  She was evaluated by ENT.  Told she had barotrauma.  Was given a prednisone taper.  This made her feel worse.  Had f/u last week.  Still has some decreased hearing - left ear.  Reports increased cough.  Using nasacort, vicks and green tea.  States if she breathes in, she will notice some cough.  We discussed the possibility of acid reflux.  Has a history of acid reflux.  Notices some break through.   Bowels stable.  Has IBS.  Also has a history of diverticulosis.  Mother had colon polyps and her maternal grandmother had colon cancer.  She also reports left lateral breast soreness.  Sore to touch.  Persistent.     Past Medical History  Diagnosis Date  . Osteoporosis   . Arthritis   . GERD (gastroesophageal reflux disease)   . Heart murmur   . Migraine     H/O, none since stopping chocolate  . Allergy   . Diverticulitis     H/O  . Rheumatic fever     H/O  . Basal cell cancer      Outpatient Encounter Prescriptions as of 12/03/2013  Medication Sig  . Calcium Carbonate (CALTRATE 600 PO) Take by mouth.  . dicyclomine (BENTYL) 10 MG capsule One capsule q day prn  . IBUPROFEN PO Take by mouth.  . Multiple Vitamins-Minerals (ALIVE WOMENS 50+ PO) Take by mouth.  . Omega-3 Fatty Acids (FISH OIL PO) Take by mouth.  . Probiotic Product (PROBIOTIC DAILY PO) Take by mouth.  Marland Kitchen omeprazole (PRILOSEC) 20 MG capsule Take 1 capsule (20 mg total) by mouth daily.  . ranitidine (ZANTAC) 150 MG tablet Take one tablet every evening.  (either 30 minutes before your evening meal or before bed)  . [DISCONTINUED] azithromycin (ZITHROMAX) 250 MG tablet Take 2 tablets x  1 day and then one po q day x 4 days.  . [DISCONTINUED] UNABLE TO FIND Med Name: Tumeric    Review of Systems Patient denies any headache, lightheadedness or dizziness.  Some persistent ear fullness as outlined.  Increased cough. No chest pain, tightness or palpitations.  No increased shortness of breath.   No nausea or vomiting.  Takes ranitidine for her acid reflux.  She has required dilatation of her esophagus previously.  Was questioning of some of her symptoms could be related to acid reflux.  No abdominal pain or cramping.  No bowel change.  Has IBS.  Stable.  No BRBPR or melana.  Has had colonoscopy.  No urine change.  Some soreness - left lateral breast.       Objective:   Physical Exam  Filed Vitals:   12/03/13 1221  BP: 120/70  Pulse: 60  Temp: 98.2 F (90.46 C)   63 year old female in no acute distress.   HEENT:  Nares- clear.  Oropharynx - without lesions.  TMs visualized - without erythema.   NECK:  Supple.  Nontender.  No audible bruit.  HEART:  Appears to be regular. LUNGS:  No crackles or wheezing audible.  Respirations even and unlabored.  RADIAL PULSE:  Equal bilaterally.  BREASTS:  No nipple discharge or nipple retraction present.  Could not appreciate any distinct nodules or axillary adenopathy.  Increased tenderness left lateral breast.   ABDOMEN:  Soft, nontender.  Bowel sounds present and normal.  No audible abdominal bruit.   EXTREMITIES:  No increased edema present.  DP pulses palpable and equal bilaterally.          Assessment & Plan:  HEALTH MAINTENANCE.  Up to date with her pelvic/paps and breast/mammograms.  Followed by gyn.  See records.    I spent 25 minutes with the patient and more than 50% of the time was spent in consultation regarding the above.

## 2013-12-08 NOTE — Assessment & Plan Note (Signed)
Maternal grandmother with colon cancer and mother had colon polyps.  Pt with IBS.  Last colonoscopy 05/27/09.  Bowels stable.

## 2013-12-08 NOTE — Assessment & Plan Note (Signed)
EGD as outlined.  Omeprazole in the am and ranitidine in the evening.  See if this helps her current symptoms of cough and congestion.  After discussion, will refer to GI for further evaluation.

## 2013-12-08 NOTE — Assessment & Plan Note (Signed)
Persistent ear fullness and decreased hearing as outlined.  Being followed by ENT.

## 2013-12-08 NOTE — Assessment & Plan Note (Signed)
Persistent cough and congestion as outlined.  Has seen ENT.  Did not respond to prednisone taper.  nasacort nasal spray as directed.  Saline nasal flushes.  Omeprazole in the am and ranitidine in the evening.  See if this improves symptoms.  Has a history of GERD and is s/p dilatation in the past.

## 2013-12-08 NOTE — Assessment & Plan Note (Signed)
Bowels stable.  

## 2013-12-11 ENCOUNTER — Telehealth: Payer: Self-pay | Admitting: Internal Medicine

## 2013-12-11 NOTE — Telephone Encounter (Signed)
Please advise 

## 2013-12-11 NOTE — Telephone Encounter (Signed)
Pt.notified

## 2013-12-11 NOTE — Telephone Encounter (Signed)
Dr Wynelle Link in [place].

## 2013-12-11 NOTE — Telephone Encounter (Signed)
Pt called in and stated would like to know if Dr.Scott knows any good orthopedic surgeons to be referred to. Been to [facility] clinic and doesn't want to go back there. Would like a call back.

## 2013-12-17 ENCOUNTER — Encounter: Payer: Self-pay | Admitting: Internal Medicine

## 2013-12-24 ENCOUNTER — Ambulatory Visit: Payer: Self-pay | Admitting: Internal Medicine

## 2013-12-24 ENCOUNTER — Ambulatory Visit: Payer: BC Managed Care – PPO | Admitting: Family Medicine

## 2013-12-24 LAB — HM MAMMOGRAPHY: HM Mammogram: NEGATIVE

## 2013-12-26 ENCOUNTER — Encounter: Payer: Self-pay | Admitting: Internal Medicine

## 2013-12-30 ENCOUNTER — Telehealth: Payer: Self-pay | Admitting: Internal Medicine

## 2013-12-30 NOTE — Telephone Encounter (Signed)
Pt notified of diagnostic left mammo and ultrasound results - Birads I.  Sent my chart message questioning if she wanted me to go ahead and schedule her screening mammo in 9/15 and also if persistent pain, needs reevaluation.

## 2014-01-16 ENCOUNTER — Encounter: Payer: Self-pay | Admitting: Internal Medicine

## 2014-01-16 NOTE — Progress Notes (Signed)
Here was patients response from last mychart message:   I can wait to schedule the mammogram at my appointment in October 2015. Thank you.   ----- Message -----  From: Alisa Graff, MD  Sent: 12/30/2013 7:10 PM EDT  To: Victoria Moss  Subject: mammogram results   I received the results of the mammogram and ultrasound of the left breast. The radiologist interpretation stated no evidence of malignancy - ok. They only did the left breast, so their recommendation is bilateral screening in 9/15. Would you like for me to go ahead and schedule this screening mammogram, or do you want to wait and schedule at your upcoming appointment in 10/15. Also, if you are continuing to have pain in the left breast, we can get you set up for a reevaluation. Just let us know.   Thanks.  Dr Nicki Reaper

## 2014-02-01 ENCOUNTER — Ambulatory Visit (INDEPENDENT_AMBULATORY_CARE_PROVIDER_SITE_OTHER): Payer: BC Managed Care – PPO

## 2014-02-01 DIAGNOSIS — Z23 Encounter for immunization: Secondary | ICD-10-CM

## 2014-02-04 ENCOUNTER — Telehealth: Payer: Self-pay | Admitting: *Deleted

## 2014-02-04 NOTE — Telephone Encounter (Signed)
Pt spoke with Bonnita Nasuti & wanted a referral to Rheumatology. Pt wants to know who you recommend. Pt states that a response via mychart would be fine.

## 2014-02-04 NOTE — Telephone Encounter (Signed)
Sent pt a my chart message with information for referral.

## 2014-02-18 ENCOUNTER — Ambulatory Visit: Payer: BC Managed Care – PPO | Admitting: Internal Medicine

## 2014-02-25 ENCOUNTER — Telehealth: Payer: Self-pay | Admitting: Internal Medicine

## 2014-02-25 NOTE — Telephone Encounter (Signed)
The patient has an elevated top bp # and she wants to see Dr. Nicki Reaper.

## 2014-02-25 NOTE — Telephone Encounter (Signed)
FYI Spoke with patient, she has had her BP checked at the dentist and another medical office. Has noticed that the systolic readings have been mildly elevated 701-779, diastolic readings 39-03. States home cuff gives readings 120-145/75-80. Pt denies any other symptoms. States she has had arthritis pain as well as some dental issues at the time of the elevated readings. Advised that could cause some elevation in her systolic readings. Does not take any BP medications. Has not had any changes in her meds. She would like to schedule a time to discuss these readings with Dr. Nicki Reaper. Appt scheduled 03/13/14.

## 2014-03-10 ENCOUNTER — Encounter: Payer: Self-pay | Admitting: Internal Medicine

## 2014-03-13 ENCOUNTER — Ambulatory Visit: Payer: BC Managed Care – PPO | Admitting: Internal Medicine

## 2014-07-20 ENCOUNTER — Encounter: Payer: Self-pay | Admitting: Internal Medicine

## 2014-07-20 DIAGNOSIS — Z1239 Encounter for other screening for malignant neoplasm of breast: Secondary | ICD-10-CM

## 2014-07-20 DIAGNOSIS — K219 Gastro-esophageal reflux disease without esophagitis: Secondary | ICD-10-CM

## 2014-07-22 NOTE — Telephone Encounter (Signed)
Order placed for GI referral and mammogram.

## 2014-08-07 ENCOUNTER — Encounter: Payer: Self-pay | Admitting: Internal Medicine

## 2014-08-07 ENCOUNTER — Ambulatory Visit (INDEPENDENT_AMBULATORY_CARE_PROVIDER_SITE_OTHER): Payer: BLUE CROSS/BLUE SHIELD | Admitting: Internal Medicine

## 2014-08-07 ENCOUNTER — Other Ambulatory Visit (HOSPITAL_COMMUNITY)
Admission: RE | Admit: 2014-08-07 | Discharge: 2014-08-07 | Disposition: A | Discharge status: Home / Self Care | Payer: BLUE CROSS/BLUE SHIELD | Source: Ambulatory Visit | Attending: Internal Medicine | Admitting: Internal Medicine

## 2014-08-07 VITALS — BP 120/80 | HR 64 | Temp 98.5°F | Ht 65.5 in | Wt 135.2 lb

## 2014-08-07 DIAGNOSIS — Z124 Encounter for screening for malignant neoplasm of cervix: Secondary | ICD-10-CM | POA: Diagnosis not present

## 2014-08-07 DIAGNOSIS — Z1151 Encounter for screening for human papillomavirus (HPV): Secondary | ICD-10-CM | POA: Insufficient documentation

## 2014-08-07 DIAGNOSIS — K219 Gastro-esophageal reflux disease without esophagitis: Secondary | ICD-10-CM | POA: Diagnosis not present

## 2014-08-07 DIAGNOSIS — M810 Age-related osteoporosis without current pathological fracture: Secondary | ICD-10-CM

## 2014-08-07 DIAGNOSIS — K589 Irritable bowel syndrome without diarrhea: Secondary | ICD-10-CM | POA: Diagnosis not present

## 2014-08-07 DIAGNOSIS — D72819 Decreased white blood cell count, unspecified: Secondary | ICD-10-CM | POA: Diagnosis not present

## 2014-08-07 DIAGNOSIS — Z01419 Encounter for gynecological examination (general) (routine) without abnormal findings: Secondary | ICD-10-CM | POA: Insufficient documentation

## 2014-08-07 DIAGNOSIS — N644 Mastodynia: Secondary | ICD-10-CM

## 2014-08-07 DIAGNOSIS — Z8371 Family history of colonic polyps: Secondary | ICD-10-CM

## 2014-08-07 DIAGNOSIS — Z91048 Other nonmedicinal substance allergy status: Secondary | ICD-10-CM

## 2014-08-07 DIAGNOSIS — Z9109 Other allergy status, other than to drugs and biological substances: Secondary | ICD-10-CM

## 2014-08-07 DIAGNOSIS — Z0000 Encounter for general adult medical examination without abnormal findings: Secondary | ICD-10-CM

## 2014-08-07 MED ORDER — DICYCLOMINE HCL 10 MG PO CAPS: ORAL_CAPSULE | ORAL | Status: DC

## 2014-08-07 NOTE — Progress Notes (Signed)
Pre visit review using our clinic review tool, if applicable. No additional management support is needed unless otherwise documented below in the visit note. 

## 2014-08-08 ENCOUNTER — Telehealth: Payer: Self-pay | Admitting: *Deleted

## 2014-08-08 NOTE — Telephone Encounter (Signed)
Pt is coming Monday what labs and dx?

## 2014-08-09 ENCOUNTER — Other Ambulatory Visit: Payer: Self-pay | Admitting: Internal Medicine

## 2014-08-09 DIAGNOSIS — D72819 Decreased white blood cell count, unspecified: Secondary | ICD-10-CM

## 2014-08-09 DIAGNOSIS — Z13220 Encounter for screening for lipoid disorders: Secondary | ICD-10-CM

## 2014-08-09 DIAGNOSIS — K219 Gastro-esophageal reflux disease without esophagitis: Secondary | ICD-10-CM

## 2014-08-09 NOTE — Progress Notes (Signed)
Order placed for labs.

## 2014-08-10 ENCOUNTER — Encounter: Payer: Self-pay | Admitting: Internal Medicine

## 2014-08-10 DIAGNOSIS — Z0000 Encounter for general adult medical examination without abnormal findings: Secondary | ICD-10-CM | POA: Insufficient documentation

## 2014-08-10 NOTE — Assessment & Plan Note (Signed)
Bowels stable.  

## 2014-08-10 NOTE — Assessment & Plan Note (Signed)
EGDs as outlined.  Stress aggravates.  Has adjusted her diet.  Helped.  Has appt with Dr Hilarie Fredrickson.

## 2014-08-10 NOTE — Assessment & Plan Note (Signed)
Last bone density unchanged.  Osteoporosis.  Have discussed reclast.

## 2014-08-10 NOTE — Assessment & Plan Note (Signed)
Better  

## 2014-08-10 NOTE — Assessment & Plan Note (Signed)
Mother had polyps.  Colonoscopy 05/27/09 - diverticulosis.  Due f/u.  Has appt with Dr Jefm Bryant.  Needs referral.

## 2014-08-10 NOTE — Assessment & Plan Note (Signed)
Had breast tenderness previously.  With some fullness - right axilla.  Due bilateral mammogram.  Will schedule bilateral diagnostic mammogram with right breast ultrasound.

## 2014-08-10 NOTE — Assessment & Plan Note (Signed)
Physical 08/07/14.  PAP 08/07/14.  Colonoscopy 2011.  Due f/u colonoscopy.  Schedule bilateral diagnostic mammogram as outlined.

## 2014-08-10 NOTE — Progress Notes (Signed)
Patient ID: Victoria Moss, female   DOB: Feb 10, 1951, 64 y.o.   MRN: [id number]   Subjective:    Patient ID: Victoria Moss, female    DOB: Jun 19, 1950, 64 y.o.   MRN: [id number]  HPI  Patient here for her physical exam.  She has adjusted her diet.  Not taking medications now.  Has had problems with reflux.  Stress aggravates.  Her mother had polyps.  Last colonoscopy 2011.  Due f/u colonoscopy.  Has appt with Dr Hilarie Fredrickson.  Just needs referral.  No cardiac symptoms with increased activity or exertion.  Breathing stable.  Is exercising.  Doing yoga.  Has noticed right axilla fullness. No nipple discharge.     Past Medical History  Diagnosis Date  . Osteoporosis   . Arthritis   . GERD (gastroesophageal reflux disease)   . Heart murmur   . Migraine     H/O, none since stopping chocolate  . Allergy   . Diverticulitis     H/O  . Rheumatic fever     H/O  . Basal cell cancer     Current Outpatient Prescriptions on File Prior to Visit  Medication Sig Dispense Refill  . Calcium Carbonate (CALTRATE 600 PO) Take by mouth.    . IBUPROFEN PO Take by mouth.    . Multiple Vitamins-Minerals (ALIVE WOMENS 50+ PO) Take by mouth.    . Omega-3 Fatty Acids (FISH OIL PO) Take by mouth.    Marland Kitchen omeprazole (PRILOSEC) 20 MG capsule Take 1 capsule (20 mg total) by mouth daily. (Patient taking differently: Take 20 mg by mouth daily as needed. ) 30 capsule 3  . ranitidine (ZANTAC) 150 MG tablet Take one tablet every evening.  (either 30 minutes before your evening meal or before bed) (Patient taking differently: Take 150 mg by mouth daily as needed for heartburn. Take one tablet every evening.  (either 30 minutes before your evening meal or before bed)) 30 tablet 3   No current facility-administered medications on file prior to visit.    Review of Systems  Constitutional: Negative for appetite change and unexpected weight change.  HENT: Negative for congestion and sinus pressure.   Eyes: Negative for pain and  visual disturbance.  Respiratory: Negative for cough, chest tightness and shortness of breath.   Cardiovascular: Negative for chest pain, palpitations and leg swelling.  Gastrointestinal: Negative for nausea, vomiting, abdominal pain and diarrhea.  Genitourinary: Negative for dysuria and difficulty urinating.  Musculoskeletal: Negative for back pain and joint swelling.  Skin: Negative for color change and rash.  Neurological: Negative for dizziness, light-headedness and headaches.  Hematological: Negative for adenopathy. Does not bruise/bleed easily.  Psychiatric/Behavioral: Negative for dysphoric mood and agitation.       Objective:    Physical Exam  Constitutional: She is oriented to person, place, and time. She appears well-developed and well-nourished.  HENT:  Nose: Nose normal.  Mouth/Throat: Oropharynx is clear and moist.  Eyes: Right eye exhibits no discharge. Left eye exhibits no discharge. No scleral icterus.  Neck: Neck supple. No thyromegaly present.  Cardiovascular: Normal rate and regular rhythm.   Pulmonary/Chest: Breath sounds normal. No accessory muscle usage. No tachypnea. No respiratory distress. She has no decreased breath sounds. She has no wheezes. She has no rhonchi. Right breast exhibits no inverted nipple, no mass, no nipple discharge and no tenderness (no axillary adenopathy). Left breast exhibits no inverted nipple, no mass, no nipple discharge and no tenderness (no axilarry adenopathy).  She did have some right axillary  fullness.    Abdominal: Soft. Bowel sounds are normal. There is no tenderness.  Genitourinary:  Normal external genitalia.  Vaginal vault without lesions.  Cervix identified.  Pap smear performed.  Could not appreciate any adnexal masses or tenderness.    Musculoskeletal: She exhibits no edema or tenderness.  Lymphadenopathy:    She has no cervical adenopathy.  Neurological: She is alert and oriented to person, place, and time.  Skin: Skin is  warm. No rash noted.  Psychiatric: She has a normal mood and affect. Her behavior is normal.    BP 120/80 mmHg  Pulse 64  Temp(Src) 98.5 F (36.9 C) (Oral)  Ht 5' 5.5" (1.664 m)  Wt 135 lb 4 oz (61.349 kg)  BMI 22.16 kg/m2  SpO2 97% Wt Readings from Last 3 Encounters:  08/07/14 135 lb 4 oz (61.349 kg)  12/03/13 138 lb (62.596 kg)  11/13/13 139 lb 8 oz (63.277 kg)     Lab Results  Component Value Date   WBC 6.6 11/13/2013   HGB 12.8 11/13/2013   HCT 37.9 11/13/2013   PLT 218.0 11/13/2013   GLUCOSE 83 11/13/2013   CHOL 170 12/26/2012   TRIG 100.0 12/26/2012   HDL 63.80 12/26/2012   LDLCALC 86 12/26/2012   ALT 16 11/13/2013   AST 21 11/13/2013   NA 137 11/13/2013   K 4.7 11/13/2013   CL 103 11/13/2013   CREATININE 0.6 11/13/2013   BUN 10 11/13/2013   CO2 28 11/13/2013   TSH 0.50 11/13/2013       Assessment & Plan:   Problem List Items Addressed This Visit    Breast tenderness    Had breast tenderness previously.  With some fullness - right axilla.  Due bilateral mammogram.  Will schedule bilateral diagnostic mammogram with right breast ultrasound.        Relevant Orders   MM Digital Diagnostic Bilat   US BREAST LTD UNI RIGHT INC AXILLA   Environmental allergies    Better.        Family history of colonic polyps - Primary    Mother had polyps.  Colonoscopy 05/27/09 - diverticulosis.  Due f/u.  Has appt with Dr Jefm Bryant.  Needs referral.        Relevant Orders   Ambulatory referral to Gastroenterology   GERD (gastroesophageal reflux disease)    EGDs as outlined.  Stress aggravates.  Has adjusted her diet.  Helped.  Has appt with Dr Hilarie Fredrickson.        Relevant Medications   dicyclomine (BENTYL) 10 MG capsule   Other Relevant Orders   Ambulatory referral to Gastroenterology   Health care maintenance    Physical 08/07/14.  PAP 08/07/14.  Colonoscopy 2011.  Due f/u colonoscopy.  Schedule bilateral diagnostic mammogram as outlined.        IBS (irritable bowel  syndrome)    Bowels stable.        Relevant Medications   dicyclomine (BENTYL) 10 MG capsule   Leukopenia    Recheck cbc to confirm stable.        Osteoporosis    Last bone density unchanged.  Osteoporosis.  Have discussed reclast.        Other Visit Diagnoses    Pap smear for cervical cancer screening        Relevant Orders    Cytology - PAP      I spent 25 minutes with the patient and more than 50% of the time was spent in consultation regarding the  above.     Einar Pheasant, MD

## 2014-08-10 NOTE — Assessment & Plan Note (Signed)
Recheck cbc to confirm stable.  

## 2014-08-11 ENCOUNTER — Other Ambulatory Visit (INDEPENDENT_AMBULATORY_CARE_PROVIDER_SITE_OTHER): Payer: BLUE CROSS/BLUE SHIELD

## 2014-08-11 DIAGNOSIS — Z13220 Encounter for screening for lipoid disorders: Secondary | ICD-10-CM

## 2014-08-11 DIAGNOSIS — K219 Gastro-esophageal reflux disease without esophagitis: Secondary | ICD-10-CM

## 2014-08-11 DIAGNOSIS — D72819 Decreased white blood cell count, unspecified: Secondary | ICD-10-CM | POA: Diagnosis not present

## 2014-08-11 LAB — COMPREHENSIVE METABOLIC PANEL
ALT: 15 U/L (ref 0–35)
AST: 18 U/L (ref 0–37)
Albumin: 4.5 g/dL (ref 3.5–5.2)
Alkaline Phosphatase: 70 U/L (ref 39–117)
BILIRUBIN TOTAL: 0.4 mg/dL (ref 0.2–1.2)
BUN: 12 mg/dL (ref 6–23)
CO2: 29 mEq/L (ref 19–32)
CREATININE: 0.68 mg/dL (ref 0.40–1.20)
Calcium: 10.5 mg/dL (ref 8.4–10.5)
Chloride: 105 mEq/L (ref 96–112)
GFR: 92.51 mL/min (ref >60.00)
Glucose, Bld: 85 mg/dL (ref 70–99)
Potassium: 4.8 mEq/L (ref 3.5–5.1)
Sodium: 138 mEq/L (ref 135–145)
Total Protein: 7 g/dL (ref 6.0–8.3)

## 2014-08-11 LAB — CBC WITH DIFFERENTIAL/PLATELET
BASOS PCT: 0.4 % (ref 0.0–3.0)
Basophils Absolute: 0 10*3/uL (ref 0.0–0.1)
EOS ABS: 0.2 10*3/uL (ref 0.0–0.7)
Eosinophils Relative: 4.7 % (ref 0.0–5.0)
HEMATOCRIT: 39.5 % (ref 36.0–46.0)
HEMOGLOBIN: 13.6 g/dL (ref 12.0–15.0)
Lymphocytes Relative: 22.3 % (ref 12.0–46.0)
Lymphs Abs: 1 10*3/uL (ref 0.7–4.0)
MCHC: 34.5 g/dL (ref 30.0–36.0)
MCV: 88.8 fl (ref 78.0–100.0)
Monocytes Absolute: 0.3 10*3/uL (ref 0.1–1.0)
Monocytes Relative: 6.3 % (ref 3.0–12.0)
NEUTROS ABS: 3.1 10*3/uL (ref 1.4–7.7)
Neutrophils Relative %: 66.3 % (ref 43.0–77.0)
Platelets: 227 10*3/uL (ref 150.0–400.0)
RBC: 4.44 Mil/uL (ref 3.87–5.11)
RDW: 13.5 % (ref 11.5–15.5)
WBC: 4.7 10*3/uL (ref 4.0–10.5)

## 2014-08-11 LAB — LIPID PANEL
Cholesterol: 160 mg/dL (ref 0–200)
HDL: 59.4 mg/dL (ref >39.00)
LDL Cholesterol: 84 mg/dL (ref 0–99)
NonHDL: 100.6
Total CHOL/HDL Ratio: 3
Triglycerides: 83 mg/dL (ref 0.0–149.0)
VLDL: 16.6 mg/dL (ref 0.0–40.0)

## 2014-08-11 LAB — CYTOLOGY - PAP

## 2014-08-12 ENCOUNTER — Encounter: Payer: Self-pay | Admitting: Internal Medicine

## 2014-08-18 ENCOUNTER — Encounter: Payer: Self-pay | Admitting: Internal Medicine

## 2014-08-20 ENCOUNTER — Telehealth: Payer: Self-pay | Admitting: Internal Medicine

## 2014-08-20 ENCOUNTER — Emergency Department: Admit: 2014-08-20 | Disposition: A | Discharge status: Home / Self Care | Payer: Self-pay | Admitting: Internal Medicine

## 2014-08-20 NOTE — Telephone Encounter (Signed)
Reviewed message.  Please call later today or tomorrow for update.

## 2014-08-20 NOTE — Telephone Encounter (Signed)
Noted.  Let us know if need anything.  

## 2014-08-20 NOTE — Telephone Encounter (Signed)
Spoke with pt, she states she was evaluated in ER.  Xrays showed no fractures.  Pt advised to elevate and apply heat in 20 minute increments and is complying.

## 2014-08-20 NOTE — Telephone Encounter (Signed)
[facility]  Patient Name: Victoria Moss  DOB: May 09, 1951    Initial Comment [name] states she twisted top of foot 2 wks ago. Now noticing tingling and numbness in top of foot going up ankle.   Nurse Assessment  Nurse: Mechele Dawley, RN, Amy Date/Time Eilene Ghazi Time): 08/20/2014 9:54:19 AM  Confirm and document reason for call. If symptomatic, describe symptoms. ---[name] STATES THAT SHE TWISTED THE TOP AND SIDE OF THE ANKLE A WEEK AGO. SHE DID REST, ICE, COMPRESS AND ELEVATE. SHE IS TAKING CARE OF HER ELDERLY PARENTS. SHE IS HAVING A LOT OF SORENESS. TODAY SHE HAS NOTICED A LOT OF ACHY, TINGLING, NUMBNESS AND TOP OF THE FOOT THAT IS GOING UP THE LEG SOME. TOP OF THE FOOT IS WARM TO TOUCH. LEG DOES NOT FEEL WARM, FOOT IS BLACK AND BLUE. LEFT FOOT. SHE IS ABLE TO WALK ON IT. SHE DID NOT NOTICE THE NUMBNESS AND TINGLING FEELING UNTIL TODAY - THIS MORNING. SHE STATES ITS MORE ON THE OUTSIDE BOTTOM PART OF HER CALF TOWARDS THE FOOT.  Has the patient traveled out of the country within the last 30 days? ---Not Applicable  Does the patient require triage? ---Yes  Related visit to physician within the last 2 weeks? ---Yes  Does the PT have any chronic conditions? (i.e. diabetes, asthma, etc.) ---Yes  List chronic conditions. ---ARTHRITIS     Guidelines    Guideline Title Affirmed Question Affirmed Notes  Foot and Ankle Injury [1] Numbness (new loss of sensation) of toe(s) AND [2] present now    Final Disposition User   Go to ED Now [place], Therapist, sports, [place]

## 2014-08-20 NOTE — Telephone Encounter (Signed)
Spoke with pt, pt states she is going to be evaluated in the ED today.  Further states it is going to take her approx an hour to get there but she is going.

## 2014-08-21 ENCOUNTER — Encounter: Payer: Self-pay | Admitting: Internal Medicine

## 2014-08-22 ENCOUNTER — Encounter: Payer: Self-pay | Admitting: *Deleted

## 2014-08-29 ENCOUNTER — Ambulatory Visit
Admit: 2014-08-29 | Disposition: A | Discharge status: Home / Self Care | Payer: Self-pay | Attending: Internal Medicine | Admitting: Internal Medicine

## 2014-08-29 LAB — HM MAMMOGRAPHY

## 2014-09-01 ENCOUNTER — Encounter: Payer: Self-pay | Admitting: Internal Medicine

## 2014-09-01 ENCOUNTER — Telehealth: Payer: Self-pay | Admitting: *Deleted

## 2014-09-01 DIAGNOSIS — R928 Other abnormal and inconclusive findings on diagnostic imaging of breast: Secondary | ICD-10-CM

## 2014-09-01 NOTE — Telephone Encounter (Signed)
Copy of mammogram placed in yellow folder

## 2014-09-01 NOTE — Telephone Encounter (Signed)
Had mammogram on Friday. Radiology recommending a core bx of the right axillary lymph node. Please advise

## 2014-09-02 NOTE — Telephone Encounter (Signed)
Spoke to pt,  Agreeable to referral.  Order placed for general surgery referral.

## 2014-09-07 HISTORY — PX: BREAST BIOPSY: SHX20

## 2014-09-08 ENCOUNTER — Encounter: Payer: Self-pay | Admitting: *Deleted

## 2014-09-11 ENCOUNTER — Encounter: Payer: Self-pay | Admitting: General Surgery

## 2014-09-11 ENCOUNTER — Ambulatory Visit (INDEPENDENT_AMBULATORY_CARE_PROVIDER_SITE_OTHER): Payer: BLUE CROSS/BLUE SHIELD | Admitting: General Surgery

## 2014-09-11 VITALS — BP 120/70 | HR 76 | Resp 12 | Ht 65.7 in | Wt 139.0 lb

## 2014-09-11 DIAGNOSIS — R591 Generalized enlarged lymph nodes: Secondary | ICD-10-CM

## 2014-09-11 NOTE — Patient Instructions (Addendum)
Continue self breast exams. Call office for any new breast issues or concerns. Call back with decision

## 2014-09-11 NOTE — Progress Notes (Signed)
Patient ID: Akansha Wyche, female   DOB: 08/04/1950, 64 y.o.   MRN: [id number]  Chief Complaint  Patient presents with  . Other    abnormal mammogram    HPI Mianna Iezzi is a 64 y.o. female.  who presents for a breast evaluation. The most recent mammogram and ultrasound was done on 08-29-14.  Patient does perform regular self breast checks and gets regular mammograms done.  She states she noticed a lump in the right axilla area for about a year. States it has not changed in size. Denies any pain. She states it feels like a "fatty tumor" since she has them removed in other spots.  Family history of breast cancer includes her mother.  HPI  Past Medical History  Diagnosis Date  . Osteoporosis   . Arthritis   . GERD (gastroesophageal reflux disease)   . Heart murmur   . Migraine     H/O, none since stopping chocolate  . Allergy   . Diverticulosis     H/O  . Rheumatic fever     H/O  . Basal cell cancer   . IBS (irritable bowel syndrome)     Past Surgical History  Procedure Laterality Date  . Umbilical hernia repair    . Tubal ligation    . Dilation and curettage of uterus      x2  . Appendectomy  1982  . Cesarean section    . Toe surgery Left     joint replacement bit toe  . Hernia repair    . Dilation and curettage, diagnostic / therapeutic      x 2  . Skin cancer excision      BCCA   . Fatty tumor      on back    Family History  Problem Relation Age of Onset  . Diabetes Mother   . Breast cancer Mother 74  . Hypertension Mother   . Stroke Mother     Late 61  . Arthritis Mother   . Hypertension Father   . Hyperlipidemia Father     Social History History  Substance Use Topics  . Smoking status: Former Smoker -- 5 years    Quit date: 05/09/1973  . Smokeless tobacco: Never Used     Comment: Quit at age 4  . Alcohol Use: No    Allergies  Allergen Reactions  . Codeine Itching  . Penicillins Itching    Current Outpatient  Prescriptions  Medication Sig Dispense Refill  . BIOTIN PO Take by mouth.    . Calcium Carbonate (CALTRATE 600 PO) Take by mouth.    . dicyclomine (BENTYL) 10 MG capsule One capsule q day prn 90 capsule 1  . IBUPROFEN PO Take by mouth as needed.     . Multiple Vitamins-Minerals (ALIVE WOMENS 50+ PO) Take by mouth.    . Omega-3 Fatty Acids (FISH OIL PO) Take by mouth.    . Vitamin D, Cholecalciferol, 400 UNITS CAPS Take by mouth.     No current facility-administered medications for this visit.    Review of Systems Review of Systems  Constitutional: Negative.   Respiratory: Negative.   Cardiovascular: Negative.     Blood pressure 120/70, pulse 76, resp. rate 12, height 5' 5.7" (1.669 m), weight 139 lb (63.05 kg).  Physical Exam Physical Exam  Constitutional: She is oriented to person, place, and time. She appears well-developed and well-nourished.  Neck: Neck supple.  Cardiovascular: Normal rate and regular rhythm.   Murmur heard.  Systolic murmur is present with a grade of 2/6  Pulses:      Femoral pulses are 2+ on the right side, and 2+ on the left side. Pulmonary/Chest: Effort normal and breath sounds normal. Right breast exhibits no inverted nipple, no mass, no nipple discharge, no skin change and no tenderness. Left breast exhibits no inverted nipple, no mass, no nipple discharge, no skin change and no tenderness.  Abdominal: Soft. Normal appearance. There is no splenomegaly or hepatomegaly. There is no tenderness.  Lymphadenopathy:    She has no cervical adenopathy.    She has axillary adenopathy.       Right: No inguinal adenopathy present.       Left: No inguinal adenopathy present.  Right axillary node 1.5 x 2 cm.  Neurological: She is alert and oriented to person, place, and time.  Skin: Skin is warm, dry and intact.  5 cm lipoma left posterior flank.    Data Reviewed Diagnostic mammograms dated 08/29/2014 were negative. Lymph nodes had previously been noted in  the right axilla dating back to 2011-2012. Similar prominent lymph nodes were not seen in 2014 or 2013.  Ultrasound examination of the right axilla demonstrated an enlarged right axillary lymph node with reported cortical thickening. Core biopsy had been recommended.    Assessment    Lymphadenopathy, borderline enlargement. Possible chronic changes.    Plan    The patient has no known history of upper extremity infection which would account for reactive lymphadenopathy. Clinical examination shows no other areas of palpable lymph nodes and she has no constitutional symptoms ( weight loss, night sweats). ).  Options for management were reviewed: 1) early excisional biopsy in the event low-grade lymphoma is present (full excision provides better pathologic analysis) versus 2) observation.  Pros and cons of each were reviewed.      She will call back with her decision.    PCP:  Nash Shearer 09/12/2014, 9:23 AM

## 2014-09-12 ENCOUNTER — Telehealth: Payer: Self-pay | Admitting: General Surgery

## 2014-09-12 DIAGNOSIS — R591 Generalized enlarged lymph nodes: Secondary | ICD-10-CM | POA: Insufficient documentation

## 2014-09-12 NOTE — Telephone Encounter (Signed)
The patient called with questions regarding management of the "recently" identified right axillary lymph node. I reviewed with her the radiologist recommendation, the review of the 2000 03/12/2015 mammograms (lymph nodes evident in 2011-2012, none in 2013, 2014, once again evident in the right axilla in 2016) as well as options for management including 1) observation versus 2) core biopsy versus 3)  his operative excision.  She reports that she's not an "watch and wait person". With her responsibilities for her elderly parents she is willing to make use of a core biopsy recognizing that formal excision may be required if lymphoma is identified.  This will be scheduled as an office procedure the week of May 16 that she is traveling with her parents to [place] next week.

## 2014-09-15 ENCOUNTER — Encounter: Payer: Self-pay | Admitting: Internal Medicine

## 2014-09-15 ENCOUNTER — Ambulatory Visit (INDEPENDENT_AMBULATORY_CARE_PROVIDER_SITE_OTHER): Payer: BLUE CROSS/BLUE SHIELD | Admitting: Internal Medicine

## 2014-09-15 VITALS — BP 112/60 | HR 68 | Ht 65.5 in | Wt 134.2 lb

## 2014-09-15 DIAGNOSIS — K219 Gastro-esophageal reflux disease without esophagitis: Secondary | ICD-10-CM | POA: Diagnosis not present

## 2014-09-15 DIAGNOSIS — K589 Irritable bowel syndrome without diarrhea: Secondary | ICD-10-CM | POA: Diagnosis not present

## 2014-09-15 DIAGNOSIS — Z1211 Encounter for screening for malignant neoplasm of colon: Secondary | ICD-10-CM | POA: Diagnosis not present

## 2014-09-15 NOTE — Progress Notes (Signed)
Patient ID: Victoria Moss, female   DOB: Jul 13, 1950, 64 y.o.   MRN: [id number] HPI: Victoria Moss is a 64 year old female with a past medical history of GERD, Schatzki's ring, diverticulosis, osteoporosis, IBS who is seen in consultation at the request of Dr. Nicki Reaper to establish care. She is here alone today. She moved from [place] to [place] approximately 4 years ago to be closer to her parents who retired in [place], [place]. 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 She reports a history of reflux and for several years took omeprazole. She also previously had some solid food dysphagia and has had 2 esophageal dilations in the past. She weaned herself off of Prilosec over fear of being on this medication chronically particularly in the setting of osteoporosis. She modified her diet dramatically and now avoids caffeine, carbonated beverages, eating large meals and eating late at night. She occasionally has flares of her heartburn symptoms and will use omeprazole for 1-2 weeks at a time but not chronically. She was given a prescription for ranitidine by Dr. Nicki Reaper to use at night on an as-needed basis. This has worked very well for her. Occasionally she reports globus sensation but this comes and goes, it is not present currently. She denies odynophagia or dysphagia. Appetite is good. No nausea or vomiting. Weight is stable. Bowel movements are normal and once to twice daily. She is lactose intolerant and avoids lactose. No blood in her stool or melena. No diarrhea or constipation. No abdominal pain. She does use dicyclomine on an as-needed basis for occasional borborygmi and cramping often associated with lactose. No family history of colorectal cancer. Her mother may have had colorectal polyps but after 2 colonoscopies the patient has never had a colon polyp.   Past Medical History  Diagnosis Date  . Osteoporosis   . Arthritis   . GERD  (gastroesophageal reflux disease)   . Heart murmur   . Migraine     H/O, none since stopping chocolate  . Allergy   . Diverticulosis     H/O  . Rheumatic fever     H/O  . Basal cell cancer   . IBS (irritable bowel syndrome)   . Status post dilation of esophageal narrowing     Past Surgical History  Procedure Laterality Date  . Umbilical hernia repair    . Tubal ligation    . Dilation and curettage of uterus      x2  . Appendectomy  1982  . Cesarean section    . Toe surgery Left     joint replacement bit toe  . Hernia repair    . Dilation and curettage, diagnostic / therapeutic      x 2  . Skin cancer excision      BCCA   . Fatty tumor      on back    Outpatient Prescriptions Prior to Visit  Medication Sig Dispense Refill  . BIOTIN PO Take 1 tablet by mouth daily.     . Calcium Carbonate (CALTRATE 600 PO) Take 1 tablet by mouth daily.     Marland Kitchen dicyclomine (BENTYL) 10 MG capsule One capsule q day prn 90 capsule 1  . IBUPROFEN PO Take by mouth as needed.     . Multiple Vitamins-Minerals (ALIVE WOMENS 50+ PO) Take 1 tablet by mouth daily.     . Omega-3 Fatty Acids (FISH OIL PO) Take 1 tablet by mouth daily.     Marland Kitchen  Vitamin D, Cholecalciferol, 400 UNITS CAPS Take 1 tablet by mouth daily.      No facility-administered medications prior to visit.    Allergies  Allergen Reactions  . Codeine Itching  . Penicillins Itching    Family History  Problem Relation Age of Onset  . Diabetes Neg Hx   . Breast cancer Mother 23  . Hypertension Mother   . Stroke Mother     Late 75  . Arthritis Mother   . Hypertension Father   . Hyperlipidemia Father   . Colon cancer Neg Hx   . Colon polyps Mother   . Esophageal cancer Neg Hx   . Kidney disease Neg Hx   . Gallbladder disease Neg Hx     History  Substance Use Topics  . Smoking status: Former Smoker -- 5 years    Quit date: 05/09/1973  . Smokeless tobacco: Never Used     Comment: Quit at age 69  . Alcohol Use: No     ROS: As per history of present illness, otherwise negative  BP 112/60 mmHg  Pulse 68  Ht 5' 5.5" (1.664 m)  Wt 134 lb 4 oz (60.895 kg)  BMI 21.99 kg/m2 Constitutional: Well-developed and well-nourished. No distress. HEENT: Normocephalic and atraumatic. Oropharynx is clear and moist. No oropharyngeal exudate. Conjunctivae are normal.  No scleral icterus. Neck: Neck supple. Trachea midline. Cardiovascular: Normal rate, regular rhythm and intact distal pulses. No M/R/G Pulmonary/chest: Effort normal and breath sounds normal. No wheezing, rales or rhonchi. Abdominal: Soft, nontender, nondistended. Bowel sounds active throughout. There are no masses palpable. No hepatosplenomegaly. Extremities: no clubbing, cyanosis, or edema Lymphadenopathy: No cervical adenopathy noted. Neurological: Alert and oriented to person place and time. Skin: Skin is warm and dry. No rashes noted. Psychiatric: Normal mood and affect. Behavior is normal.  RELEVANT LABS AND IMAGING: CBC    Component Value Date/Time   WBC 4.7 08/11/2014 0951   RBC 4.44 08/11/2014 0951   HGB 13.6 08/11/2014 0951   HCT 39.5 08/11/2014 0951   PLT 227.0 08/11/2014 0951   MCV 88.8 08/11/2014 0951   MCHC 34.5 08/11/2014 0951   RDW 13.5 08/11/2014 0951   LYMPHSABS 1.0 08/11/2014 0951   MONOABS 0.3 08/11/2014 0951   EOSABS 0.2 08/11/2014 0951   BASOSABS 0.0 08/11/2014 0951    CMP     Component Value Date/Time   NA 138 08/11/2014 0951   K 4.8 08/11/2014 0951   CL 105 08/11/2014 0951   CO2 29 08/11/2014 0951   GLUCOSE 85 08/11/2014 0951   BUN 12 08/11/2014 0951   CREATININE 0.68 08/11/2014 0951   CALCIUM 10.5 08/11/2014 0951   PROT 7.0 08/11/2014 0951   ALBUMIN 4.5 08/11/2014 0951   AST 18 08/11/2014 0951   ALT 15 08/11/2014 0951   ALKPHOS 70 08/11/2014 0951   BILITOT 0.4 08/11/2014 0951   Review of records EGD 03/25/2009 normal esophagus, benign stricture at GE junction, no esophagitis. Normal stomach. Random  biopsies performed. Normal duodenum. Biopsies unremarkable gastric mucosa. EGD 04/28/2010 small hiatal hernia, Z line at 38 cm. No Barrett's. Benign stricture. Normal stomach and duodenum. Dilation to 46 with bougie  Colonoscopy 12/05/2003 -- left-sided diverticulosis. Colonoscopy 05/27/2009 -- tortuous sigmoid, no polyps.  ASSESSMENT/PLAN: 64 year old female with a past medical history of GERD, Schatzki's ring, diverticulosis, osteoporosis, IBS who is seen in consultation at the request of Dr. Nicki Reaper to establish care.  1. GERD -- managed with diet without alarm symptom. Occasional breakthrough symptoms can be treated  with ranitidine 150 mg twice a day. Would only escalate PPI in the event of frequent heartburn not improved by H2 blocker. Currently no dysphagia and therefore will not repeat endoscopy. No history of Barrett's esophagus. Repeat endoscopy if dysphagia, uncontrolled heartburn despite therapy, or other alarm symptom. She is happy with this plan  2. IBS -- intermittent not currently an issue for her. Lactose intolerant likely given irritable symptoms. When necessary dicyclomine  3. Colon cancer screening -- 2 colonoscopies in 2005 and 2011 without polyps. Having never had a colon: Polyp she is felt average risk. Recommend repeat January 2021. She is comfortable with this recommendation.  Follow-up as needed      IE:[facility] [address], [address] [place], [place] [id number]

## 2014-09-15 NOTE — Patient Instructions (Signed)
Continue taking Ranitidine as needed.    You have been scheduled for a recall colonoscopy for 05-2019.  You will receive a letter about a month ahead of time reminding you to make an appointment.

## 2014-09-25 ENCOUNTER — Encounter: Payer: Self-pay | Admitting: General Surgery

## 2014-09-25 ENCOUNTER — Ambulatory Visit (INDEPENDENT_AMBULATORY_CARE_PROVIDER_SITE_OTHER): Payer: BLUE CROSS/BLUE SHIELD | Admitting: General Surgery

## 2014-09-25 ENCOUNTER — Other Ambulatory Visit: Payer: BLUE CROSS/BLUE SHIELD

## 2014-09-25 VITALS — BP 126/76 | HR 76 | Resp 13 | Ht 65.0 in | Wt 137.0 lb

## 2014-09-25 DIAGNOSIS — R591 Generalized enlarged lymph nodes: Secondary | ICD-10-CM

## 2014-09-25 NOTE — Progress Notes (Signed)
Patient ID: Victoria Moss, female   DOB: [birth year], 64 y.o.   MRN: [id number]  Chief Complaint  Patient presents with  . Procedure    biopsy right axillary lymphnode    HPI Victoria Moss is a 64 y.o. female here for a scheduled biopsy of right axillary lymph node.  HPI  Past Medical History  Diagnosis Date  . Osteoporosis   . Arthritis   . GERD (gastroesophageal reflux disease)   . Heart murmur   . Migraine     H/O, none since stopping chocolate  . Allergy   . Diverticulosis     H/O  . Rheumatic fever     H/O  . Basal cell cancer   . IBS (irritable bowel syndrome)   . Status post dilation of esophageal narrowing     Past Surgical History  Procedure Laterality Date  . Umbilical hernia repair    . Tubal ligation    . Dilation and curettage of uterus      x2  . Appendectomy  1982  . Cesarean section    . Toe surgery Left     joint replacement bit toe  . Hernia repair    . Dilation and curettage, diagnostic / therapeutic      x 2  . Skin cancer excision      BCCA   . Fatty tumor      on back    Family History  Problem Relation Age of Onset  . Diabetes Neg Hx   . Breast cancer Mother 38  . Hypertension Mother   . Stroke Mother     Late 71  . Arthritis Mother   . Hypertension Father   . Hyperlipidemia Father   . Colon cancer Neg Hx   . Colon polyps Mother   . Esophageal cancer Neg Hx   . Kidney disease Neg Hx   . Gallbladder disease Neg Hx     Social History History  Substance Use Topics  . Smoking status: Former Smoker -- 5 years    Quit date: 05/09/1973  . Smokeless tobacco: Never Used     Comment: Quit at age 73  . Alcohol Use: No    Allergies  Allergen Reactions  . Codeine Itching  . Penicillins Itching    Current Outpatient Prescriptions  Medication Sig Dispense Refill  . BIOTIN PO Take 1 tablet by mouth daily.     . Calcium Carbonate (CALTRATE 600 PO) Take 1 tablet by mouth daily.     Marland Kitchen dicyclomine (BENTYL) 10 MG  capsule One capsule q day prn 90 capsule 1  . IBUPROFEN PO Take by mouth as needed.     . Multiple Vitamins-Minerals (ALIVE WOMENS 50+ PO) Take 1 tablet by mouth daily.     . Omega-3 Fatty Acids (FISH OIL PO) Take 1 tablet by mouth daily.     . Vitamin D, Cholecalciferol, 400 UNITS CAPS Take 1 tablet by mouth daily.      No current facility-administered medications for this visit.    Review of Systems Review of Systems  Blood pressure 126/76, pulse 76, resp. rate 13, height 5\' 5"  (1.651 m), weight 137 lb (62.143 kg).  Physical Exam Physical Exam  Lymphadenopathy:    She has axillary adenopathy (right).    Data Reviewed Previous ultrasound showed a dominant lymph node up to 2 cm in diameter in the right axilla.  Assessment    Right axillary lymphadenopathy.    Plan    The procedure  for core biopsy was reviewed. The patient was amenable to proceed. 10 mL of 0.5% Xylocaine with 0.25% Marcaine with 1-200,000 of epinephrine was utilized well tolerated. A 14-gauge Finesse device was used and multiple core samples were obtained through multiple levels of the lymph node. Scant discomfort was appreciated. No bleeding. Skin defect closed with benzoin and Steri-Strips followed by Telfa and Tegaderm dressing. Postoperative wound care instructions reviewed.  The patient will return in one week for wound evaluation with the staff. She'll be contacted by phone when pathology results have been received.       Victoria Moss 09/27/2014, 10:23 AM

## 2014-09-25 NOTE — Patient Instructions (Addendum)
Biopsy Care After Refer to this sheet in the next few weeks. These instructions provide you with information on caring for yourself after your procedure. Your caregiver may also give you more specific instructions. Your treatment has been planned according to current medical practices, but problems sometimes occur. Call your caregiver if you have any problems or questions after your procedure. If you had a fine needle biopsy, you may have soreness at the biopsy site for 1 to 2 days. If you had an open biopsy, you may have soreness at the biopsy site for 3 to 4 days. HOME CARE INSTRUCTIONS   You may resume normal diet and activities as directed.  Change bandages (dressings) as directed. If your wound was closed with a skin glue (adhesive), it will wear off and begin to peel in 7 days.  Only take over-the-counter or prescription medicines for pain, discomfort, or fever as directed by your caregiver.  Ask your caregiver when you can bathe and get your wound wet. SEEK IMMEDIATE MEDICAL CARE IF:   You have increased bleeding (more than a small spot) from the biopsy site.  You notice redness, swelling, or increasing pain at the biopsy site.  You have pus coming from the biopsy site.  You have a fever.  You notice a bad smell coming from the biopsy site or dressing.  You have a rash, have difficulty breathing, or have any allergic problems. MAKE SURE YOU:   Understand these instructions.  Will watch your condition.  Will get help right away if you are not doing well or get worse. Document Released: 11/12/2004 Document Revised: 07/18/2011 Document Reviewed: 10/21/2010 [facility] Patient Information 2015 [place], [place]. This information is not intended to replace advice given to you by your health care provider. Make sure you discuss any questions you have with your health care provider.  Return in one week for the nurse. You may shower. You may remove your dressing in two days. Leave the  steri strips in place they will fall off on their own.

## 2014-09-26 ENCOUNTER — Telehealth: Payer: Self-pay | Admitting: General Surgery

## 2014-09-26 NOTE — Telephone Encounter (Signed)
Notified path benign.

## 2014-10-02 ENCOUNTER — Ambulatory Visit (INDEPENDENT_AMBULATORY_CARE_PROVIDER_SITE_OTHER): Payer: BLUE CROSS/BLUE SHIELD | Admitting: *Deleted

## 2014-10-02 DIAGNOSIS — R591 Generalized enlarged lymph nodes: Secondary | ICD-10-CM

## 2014-10-02 NOTE — Progress Notes (Signed)
Patient came in today for a wound check/post axilla biopsy.  The wound is clean, with no signs of infection noted. Mild irritation from Tegaderm remains. Mild bruising.  Follow up as scheduled.

## 2014-10-02 NOTE — Patient Instructions (Signed)
The patient is aware to call back for any questions or concerns.  

## 2014-10-15 ENCOUNTER — Telehealth: Payer: Self-pay | Admitting: *Deleted

## 2014-10-15 NOTE — Telephone Encounter (Signed)
I can see her on 10/24/14 at 11:30.  Thanks

## 2014-10-15 NOTE — Telephone Encounter (Signed)
Given to [facility] to schedule

## 2014-10-15 NOTE — Telephone Encounter (Signed)
If having issues with sleep, needs an appt to discuss treatment options.  Please schedule her for Friday 2:30 - block 30 min slot.

## 2014-10-15 NOTE — Telephone Encounter (Signed)
Pt called requesting OTC sleep aid.  Please advise

## 2014-10-15 NOTE — Telephone Encounter (Signed)
Spoke with pt, she is unavailable this week.  Please advise another time

## 2014-10-22 ENCOUNTER — Ambulatory Visit (INDEPENDENT_AMBULATORY_CARE_PROVIDER_SITE_OTHER): Payer: BLUE CROSS/BLUE SHIELD | Admitting: Nurse Practitioner

## 2014-10-22 VITALS — BP 122/80 | HR 64 | Temp 98.0°F | Resp 14 | Ht 65.0 in | Wt 136.0 lb

## 2014-10-22 DIAGNOSIS — F411 Generalized anxiety disorder: Secondary | ICD-10-CM | POA: Diagnosis not present

## 2014-10-22 DIAGNOSIS — G4700 Insomnia, unspecified: Secondary | ICD-10-CM

## 2014-10-22 MED ORDER — CYCLOBENZAPRINE HCL 5 MG PO TABS: 5.0000 mg | ORAL_TABLET | Freq: Every day | ORAL | Status: DC

## 2014-10-22 NOTE — Progress Notes (Signed)
   Subjective:    Patient ID: Victoria Moss, female    DOB: 1950/06/15, 64 y.o.   MRN: [id number]  HPI  Victoria Moss is a 64 yo female with a CC of insomnia x 2-3 years.   1) Very anxious and problems sitting still, stress  Has trouble falling asleep, tries to read, will get up and wander, then eventually gets to sleep. 5-6 hours sleep. Noticing more now.  Mother and father in [place] classes, goes to [employer]  Children live in [place] PM  Tylenol PM  Melatonin   Review of Systems  Constitutional: Negative for fever, chills, diaphoresis and fatigue.  Respiratory: Negative for chest tightness, shortness of breath and wheezing.   Cardiovascular: Negative for chest pain, palpitations and leg swelling.  Gastrointestinal: Negative for nausea, vomiting and diarrhea.  Skin: Negative for rash.  Neurological: Negative for dizziness, weakness, numbness and headaches.  Psychiatric/Behavioral: Positive for sleep disturbance. Negative for suicidal ideas and self-injury. The patient is nervous/anxious and is hyperactive.       Objective:   Physical Exam  Constitutional: She is oriented to person, place, and time. She appears well-developed and well-nourished. No distress.  BP 122/80 mmHg  Pulse 64  Temp(Src) 98 F (36.7 C)  Resp 14  Ht 5\' 5"  (1.651 m)  Wt 136 lb (61.689 kg)  BMI 22.63 kg/m2  SpO2 99%   HENT:  Head: Normocephalic and atraumatic.  Right Ear: External ear normal.  Left Ear: External ear normal.  Cardiovascular: Normal rate, regular rhythm, normal heart sounds and intact distal pulses.  Exam reveals no gallop and no friction rub.   No murmur heard. Pulmonary/Chest: Effort normal and breath sounds normal. No respiratory distress. She has no wheezes. She has no rales. She exhibits no tenderness.  Neurological: She is alert and oriented to person, place, and time. No cranial nerve deficit. She exhibits normal muscle tone.  Coordination normal.  Skin: Skin is warm and dry. No rash noted. She is not diaphoretic.  Psychiatric: She has a normal mood and affect. Her behavior is normal. Judgment and thought content normal.  Anxious and fidgiting       Assessment & Plan:

## 2014-10-22 NOTE — Progress Notes (Signed)
Pre visit review using our clinic review tool, if applicable. No additional management support is needed unless otherwise documented below in the visit note. 

## 2014-10-22 NOTE — Patient Instructions (Addendum)
Flexeril at night 5 mg  Valerian root tea for sleep aid Other herbs (lemon balm, lavender, passionflower have side effects)  Stress Stress-related medical problems are becoming increasingly common. The body has a built-in physical response to stressful situations. Faced with pressure, challenge or danger, we need to react quickly. Our bodies release hormones such as cortisol and adrenaline to help do this. These hormones are part of the "fight or flight" response and affect the metabolic rate, heart rate and blood pressure, resulting in a heightened, stressed state that prepares the body for optimum performance in dealing with a stressful situation. It is likely that early man required these mechanisms to stay alive, but usually modern stresses do not call for this, and the same hormones released in today's world can damage health and reduce coping ability. CAUSES  Pressure to perform at work, at school or in sports.  Threats of physical violence.  Money worries.  Arguments.  Family conflicts.  Divorce or separation from significant other.  Bereavement.  New job or unemployment.  Changes in location.  Alcohol or drug abuse. SOMETIMES, THERE IS NO PARTICULAR REASON FOR DEVELOPING STRESS. Almost all people are at risk of being stressed at some time in their lives. It is important to know that some stress is temporary and some is long term.  Temporary stress will go away when a situation is resolved. Most people can cope with short periods of stress, and it can often be relieved by relaxing, taking a walk or getting any type of exercise, chatting through issues with friends, or having a good night's sleep.  Chronic (long-term, continuous) stress is much harder to deal with. It can be psychologically and emotionally damaging. It can be harmful both for an individual and for friends and family. SYMPTOMS Everyone reacts to stress differently. There are some common effects that help [place]  recognize it. In times of extreme stress, people may:  Shake uncontrollably.  Breathe faster and deeper than normal (hyperventilate).  Vomit.  For people with asthma, stress can trigger an attack.  For some people, stress may trigger migraine headaches, ulcers, and body pain. PHYSICAL EFFECTS OF STRESS MAY INCLUDE:  Loss of energy.  Skin problems.  Aches and pains resulting from tense muscles, including neck ache, backache and tension headaches.  Increased pain from arthritis and other conditions.  Irregular heart beat (palpitations).  Periods of irritability or anger.  Apathy or depression.  Anxiety (feeling uptight or worrying).  Unusual behavior.  Loss of appetite.  Comfort eating.  Lack of concentration.  Loss of, or decreased, sex-drive.  Increased smoking, drinking, or recreational drug use.  For women, missed periods.  Ulcers, joint pain, and muscle pain. Post-traumatic stress is the stress caused by any serious accident, strong emotional damage, or extremely difficult or violent experience such as rape or war. Post-traumatic stress victims can experience mixtures of emotions such as fear, shame, depression, guilt or anger. It may include recurrent memories or images that may be haunting. These feelings can last for weeks, months or even years after the traumatic event that triggered them. Specialized treatment, possibly with medicines and psychological therapies, is available. If stress is causing physical symptoms, severe distress or making it difficult for you to function as normal, it is worth seeing your caregiver. It is important to remember that although stress is a usual part of life, extreme or prolonged stress can lead to other illnesses that will need treatment. It is better to visit a doctor sooner rather  than later. Stress has been linked to the development of high blood pressure and heart disease, as well as insomnia and depression. There is no  diagnostic test for stress since everyone reacts to it differently. But a caregiver will be able to spot the physical symptoms, such as:  Headaches.  Shingles.  Ulcers. Emotional distress such as intense worry, low mood or irritability should be detected when the doctor asks pertinent questions to identify any underlying problems that might be the cause. In case there are physical reasons for the symptoms, the doctor may also want to do some tests to exclude certain conditions. If you feel that you are suffering from stress, try to identify the aspects of your life that are causing it. Sometimes you may not be able to change or avoid them, but even a small change can have a positive ripple effect. A simple lifestyle change can make all the difference. STRATEGIES THAT CAN HELP DEAL WITH STRESS:  Delegating or sharing responsibilities.  Avoiding confrontations.  Learning to be more assertive.  Regular exercise.  Avoid using alcohol or street drugs to cope.  Eating a healthy, balanced diet, rich in fruit and vegetables and proteins.  Finding humor or absurdity in stressful situations.  Never taking on more than you know you can handle comfortably.  Organizing your time better to get as much done as possible.  Talking to friends or family and sharing your thoughts and fears.  Listening to music or relaxation tapes.  Relaxation techniques like deep breathing, meditation, and yoga.  Tensing and then relaxing your muscles, starting at the toes and working up to the head and neck. If you think that you would benefit from help, either in identifying the things that are causing your stress or in learning techniques to help you relax, see a caregiver who is capable of helping you with this. Rather than relying on medications, it is usually better to try and identify the things in your life that are causing stress and try to deal with them. There are many techniques of managing stress  including counseling, psychotherapy, aromatherapy, yoga, and exercise. Your caregiver can help you determine what is best for you. Document Released: 07/16/2002 Document Revised: 04/30/2013 Document Reviewed: 06/12/2007 [facility] Patient Information 2015 [place], [place]. This information is not intended to replace advice given to you by your health care provider. Make sure you discuss any questions you have with your health care provider.

## 2014-10-27 ENCOUNTER — Encounter: Payer: Self-pay | Admitting: Nurse Practitioner

## 2014-10-27 DIAGNOSIS — G4700 Insomnia, unspecified: Secondary | ICD-10-CM | POA: Insufficient documentation

## 2014-10-27 NOTE — Assessment & Plan Note (Addendum)
Referral to psychology at request of pt placed. Gave handout on stress since worsening. Pt prefers natural methods- try valerian root and due to MSK complaints take Flexeril 5 mg at night. FU prn worsening/failure to improve.  FU in 4 weeks

## 2014-11-04 ENCOUNTER — Telehealth: Payer: Self-pay | Admitting: Internal Medicine

## 2014-11-04 NOTE — Telephone Encounter (Signed)
Any news on Ms. Insalaco's referral to [facility]? Thanks!

## 2014-11-04 NOTE — Telephone Encounter (Signed)
Patient called checking referral to [facility] for Psychiatry? Stated referral was to be made from last visit with NP.

## 2014-11-05 NOTE — Telephone Encounter (Signed)
She is scheduled to see Dr. Rexene Edison on July 27

## 2014-12-03 ENCOUNTER — Ambulatory Visit (INDEPENDENT_AMBULATORY_CARE_PROVIDER_SITE_OTHER): Payer: BLUE CROSS/BLUE SHIELD | Admitting: Psychology

## 2014-12-03 ENCOUNTER — Encounter: Payer: Self-pay | Admitting: Family Medicine

## 2014-12-03 ENCOUNTER — Ambulatory Visit (INDEPENDENT_AMBULATORY_CARE_PROVIDER_SITE_OTHER): Payer: BLUE CROSS/BLUE SHIELD | Admitting: Family Medicine

## 2014-12-03 VITALS — BP 148/80 | HR 62 | Temp 98.4°F | Ht 65.0 in | Wt 140.0 lb

## 2014-12-03 DIAGNOSIS — R300 Dysuria: Secondary | ICD-10-CM

## 2014-12-03 DIAGNOSIS — F4323 Adjustment disorder with mixed anxiety and depressed mood: Secondary | ICD-10-CM

## 2014-12-03 DIAGNOSIS — I773 Arterial fibromuscular dysplasia: Secondary | ICD-10-CM | POA: Insufficient documentation

## 2014-12-03 DIAGNOSIS — N3001 Acute cystitis with hematuria: Secondary | ICD-10-CM | POA: Diagnosis not present

## 2014-12-03 LAB — POCT URINALYSIS DIPSTICK
BILIRUBIN UA: NEGATIVE
GLUCOSE UA: NEGATIVE
Ketones, UA: NEGATIVE
Nitrite, UA: NEGATIVE
PH UA: 7
PROTEIN UA: NEGATIVE
Spec Grav, UA: 1.01
UROBILINOGEN UA: 0.2

## 2014-12-03 MED ORDER — CEPHALEXIN 500 MG PO CAPS: 500.0000 mg | ORAL_CAPSULE | Freq: Two times a day (BID) | ORAL | Status: DC

## 2014-12-03 NOTE — Progress Notes (Signed)
Pre visit review using our clinic review tool, if applicable. No additional management support is needed unless otherwise documented below in the visit note. 

## 2014-12-03 NOTE — Assessment & Plan Note (Addendum)
New Problem.  Urinalysis consistent with UTI with moderate leukocytes and small blood. Treating with Keflex x 7 days. Advised PRN use of Azo.

## 2014-12-03 NOTE — Progress Notes (Signed)
   Subjective:    Patient ID: Victoria Moss, female    DOB: 09/23/1950, 64 y.o.   MRN: [id number]  HPI 64 year old female presents for an acute visit with complaints of dysuria.  1) Dysuria  Patient reports that last night she developed acute onset of dysuria/urinary urgency/frequency.  He states that this is moderate in severity.  No exacerbating or relieving factors. No interventions tried.  No associated fever or chills, nausea, vomiting. No associated abdominal pain/flank pain.  She is otherwise feeling well.  Social Hx - Former Smoker.   Review of Systems Per HPI. All other systems negative.     Objective: Blood pressure 148/80, pulse 62, temperature 98.4 F (36.9 C), temperature source Oral, height 5\' 5"  (1.651 m), weight 140 lb (63.504 kg), SpO2 97 %. Body mass index is 23.3 kg/(m^2).    Physical Exam  Constitutional: She is oriented to person, place, and time. She appears well-developed and well-nourished. No distress.  Cardiovascular: Normal rate and regular rhythm.  Exam reveals no gallop and no friction rub.   No murmur heard. Pulmonary/Chest: Effort normal and breath sounds normal. She has no wheezes. She has no rales.  Abdominal: Soft. She exhibits no distension. There is no tenderness. There is no rebound and no guarding.  Neurological: She is alert and oriented to person, place, and time.  Psychiatric: She has a normal mood and affect.  Vitals reviewed.     Assessment & Plan:  See Problem List

## 2014-12-03 NOTE — Patient Instructions (Signed)
It was nice to see you today.  Take the medication as prescribed.  Follow up with Dr. Nicki Reaper as indicated.  Take care  Dr. Lacinda Axon

## 2014-12-06 LAB — URINE CULTURE: Colony Count: >=100000

## 2014-12-10 ENCOUNTER — Ambulatory Visit: Payer: BLUE CROSS/BLUE SHIELD | Admitting: Psychology

## 2014-12-22 ENCOUNTER — Telehealth: Payer: Self-pay | Admitting: *Deleted

## 2014-12-22 DIAGNOSIS — M810 Age-related osteoporosis without current pathological fracture: Secondary | ICD-10-CM

## 2014-12-22 DIAGNOSIS — K219 Gastro-esophageal reflux disease without esophagitis: Secondary | ICD-10-CM

## 2014-12-22 NOTE — Telephone Encounter (Signed)
Pt coming tomorrow labs and dx?

## 2014-12-22 NOTE — Telephone Encounter (Signed)
Order placed for labs.

## 2014-12-23 ENCOUNTER — Telehealth: Payer: Self-pay | Admitting: *Deleted

## 2014-12-23 ENCOUNTER — Other Ambulatory Visit (INDEPENDENT_AMBULATORY_CARE_PROVIDER_SITE_OTHER): Payer: BLUE CROSS/BLUE SHIELD

## 2014-12-23 ENCOUNTER — Other Ambulatory Visit: Payer: Self-pay | Admitting: Internal Medicine

## 2014-12-23 DIAGNOSIS — K219 Gastro-esophageal reflux disease without esophagitis: Secondary | ICD-10-CM

## 2014-12-23 DIAGNOSIS — M810 Age-related osteoporosis without current pathological fracture: Secondary | ICD-10-CM | POA: Diagnosis not present

## 2014-12-23 DIAGNOSIS — R739 Hyperglycemia, unspecified: Secondary | ICD-10-CM

## 2014-12-23 LAB — COMPREHENSIVE METABOLIC PANEL
ALBUMIN: 4.6 g/dL (ref 3.5–5.2)
ALT: 32 U/L (ref 0–35)
AST: 31 U/L (ref 0–37)
Alkaline Phosphatase: 76 U/L (ref 39–117)
BUN: 15 mg/dL (ref 6–23)
CALCIUM: 10.6 mg/dL — AB (ref 8.4–10.5)
CHLORIDE: 102 meq/L (ref 96–112)
CO2: 29 mEq/L (ref 19–32)
Creatinine, Ser: 0.75 mg/dL (ref 0.40–1.20)
GFR: 82.52 mL/min (ref >60.00)
Glucose, Bld: 157 mg/dL — ABNORMAL HIGH (ref 70–99)
POTASSIUM: 4 meq/L (ref 3.5–5.1)
SODIUM: 137 meq/L (ref 135–145)
Total Bilirubin: 0.3 mg/dL (ref 0.2–1.2)
Total Protein: 6.9 g/dL (ref 6.0–8.3)

## 2014-12-23 LAB — TSH: TSH: 0.69 u[IU]/mL (ref 0.35–4.50)

## 2014-12-23 LAB — VITAMIN D 25 HYDROXY (VIT D DEFICIENCY, FRACTURES): VITD: 28.58 ng/mL — AB (ref 30.00–100.00)

## 2014-12-23 NOTE — Progress Notes (Signed)
Order placed for f/u sugar and calcium.

## 2014-12-24 ENCOUNTER — Ambulatory Visit: Payer: BLUE CROSS/BLUE SHIELD | Admitting: Internal Medicine

## 2014-12-31 ENCOUNTER — Other Ambulatory Visit (INDEPENDENT_AMBULATORY_CARE_PROVIDER_SITE_OTHER): Payer: BLUE CROSS/BLUE SHIELD

## 2014-12-31 DIAGNOSIS — R739 Hyperglycemia, unspecified: Secondary | ICD-10-CM | POA: Diagnosis not present

## 2014-12-31 LAB — CALCIUM: Calcium: 10.4 mg/dL (ref 8.4–10.5)

## 2014-12-31 LAB — HEMOGLOBIN A1C: HEMOGLOBIN A1C: 5.6 % (ref 4.6–6.5)

## 2015-01-01 ENCOUNTER — Encounter: Payer: Self-pay | Admitting: Family Medicine

## 2015-01-01 ENCOUNTER — Encounter: Payer: Self-pay | Admitting: Internal Medicine

## 2015-01-01 ENCOUNTER — Ambulatory Visit (INDEPENDENT_AMBULATORY_CARE_PROVIDER_SITE_OTHER): Payer: BLUE CROSS/BLUE SHIELD | Admitting: Family Medicine

## 2015-01-01 VITALS — BP 122/76 | HR 71 | Temp 98.4°F | Ht 65.0 in | Wt 137.8 lb

## 2015-01-01 DIAGNOSIS — R1013 Epigastric pain: Secondary | ICD-10-CM

## 2015-01-01 DIAGNOSIS — R109 Unspecified abdominal pain: Secondary | ICD-10-CM

## 2015-01-01 DIAGNOSIS — L989 Disorder of the skin and subcutaneous tissue, unspecified: Secondary | ICD-10-CM | POA: Insufficient documentation

## 2015-01-01 LAB — POCT URINALYSIS DIPSTICK
BILIRUBIN UA: NEGATIVE
Glucose, UA: NEGATIVE
KETONES UA: NEGATIVE
Leukocytes, UA: NEGATIVE
Nitrite, UA: NEGATIVE
Protein, UA: NEGATIVE
SPEC GRAV UA: 1.015
Urobilinogen, UA: 0.2
pH, UA: 7.5

## 2015-01-01 LAB — COMPREHENSIVE METABOLIC PANEL
ALBUMIN: 4.8 g/dL (ref 3.5–5.2)
ALT: 21 U/L (ref 0–35)
AST: 21 U/L (ref 0–37)
Alkaline Phosphatase: 71 U/L (ref 39–117)
BILIRUBIN TOTAL: 0.4 mg/dL (ref 0.2–1.2)
BUN: 14 mg/dL (ref 6–23)
CALCIUM: 11 mg/dL — AB (ref 8.4–10.5)
CO2: 29 mEq/L (ref 19–32)
Chloride: 102 mEq/L (ref 96–112)
Creatinine, Ser: 0.74 mg/dL (ref 0.40–1.20)
GFR: 83.81 mL/min (ref >60.00)
Glucose, Bld: 69 mg/dL — ABNORMAL LOW (ref 70–99)
Potassium: 5.1 mEq/L (ref 3.5–5.1)
Sodium: 139 mEq/L (ref 135–145)
TOTAL PROTEIN: 7.1 g/dL (ref 6.0–8.3)

## 2015-01-01 LAB — LIPASE: LIPASE: 21 U/L (ref 11.0–59.0)

## 2015-01-01 LAB — GLUCOSE, FASTING: Glucose, Fasting: 73 mg/dL (ref 65–99)

## 2015-01-01 LAB — URINALYSIS, MICROSCOPIC ONLY

## 2015-01-01 NOTE — Addendum Note (Signed)
Addended by: Karlene Einstein D on: 01/01/2015 02:03 PM   Modules accepted: Orders

## 2015-01-01 NOTE — Addendum Note (Signed)
Addended by: Caryl Bis, Rosella Crandell G on: 01/01/2015 12:11 PM   Modules accepted: Orders

## 2015-01-01 NOTE — Assessment & Plan Note (Signed)
Small skin lesion on back. Suspect likely abrasion, though with history of squamous cell skin cancer will have the patient follow-up with her dermatologist for further evaluation of the lesion.

## 2015-01-01 NOTE — Progress Notes (Signed)
Patient ID: Victoria Moss, female   DOB: [birth year], 64 y.o.   MRN: [id number]  Tommi Rumps, MD Phone: [phone]  Victoria Moss is a 64 y.o. female who presents today for same day visit.  Epigastric abdominal burning: patient notes starting on Thursday has had intermittent burning epigastric abdominal discomfort with associated epigastric cramping. Notes feeling bloated throughout abdomen. No pelvic pain. Notes some nausea with this. No vomiting or diarrhea. No blood in stool. Has history of GERD and has started back on prilosec with this burning sensation. No urinary symptoms. No vaginal bleeding. No vaginal discharge. Has not been sexually active in 6+ years. Is post menopausal >10 years. History of appendectomy. Notes some mild right low back discomfort with this. Denies fever, saddle anesthesia, and weakness. Notes she has had some numbness in her upper lateral thighs with this if she sits incorrectly on a chair. No other numbness. No incontinence.   Skin lesion on back: notes scabby red skin lesion on right lower back for the past 2 weeks. Felt it first in the shower. Has not changed. Has not gotten bigger. Not painful. Reports history of squamous cell skin cancer.  PMH: former smoker.  History of GERD and IBS.  ROS see HPI  Objective  Physical Exam Filed Vitals:   01/01/15 1102  BP: 122/76  Pulse: 71  Temp: 98.4 F (36.9 C)    BP Readings from Last 3 Encounters:  01/01/15 122/76  12/03/14 148/80  10/22/14 122/80   Wt Readings from Last 3 Encounters:  01/01/15 137 lb 12.8 oz (62.506 kg)  12/03/14 140 lb (63.504 kg)  10/22/14 136 lb (61.689 kg)    Physical Exam  Constitutional: She is well-developed, well-nourished, and in no distress.  HENT:  Head: Normocephalic and atraumatic.  Cardiovascular: Normal rate, regular rhythm and normal heart sounds.   Pulmonary/Chest: Effort normal and breath sounds normal.  Abdominal: Soft. Bowel sounds are  normal. She exhibits no distension and no mass. There is no rebound and no guarding.  burning sensation with palpation of epigastric area, mild diffuse bloating sensation on palpation throughout abdomen, negative murphy sign  Genitourinary: Vagina normal, uterus normal, cervix normal, right adnexa normal and left adnexa normal. No vaginal discharge found.  No pelvic tenderness  Musculoskeletal: She exhibits no edema.  Neurological: She is alert.  5/5 strength in bilateral quads, hamstrings, plantar and dorsiflexion, sensation to light touch intact in bilateral LE, normal gait, absent patellar reflexes bilaterally  Skin: Skin is warm and dry. She is not diaphoretic.        Assessment/Plan: Please see individual problem list.  Epigastric burning sensation Patient with several day history of epigastric burning sensation in setting of history of GERD with mobic use and IBS. Well appearing today. Mild burning on palpation of epigastrium and mild bloating discomfort on palpation of the rest of her abdomen. No peritoneal signs. Negative murphy sign. Vitals stable. Pelvic exam normal. Will check CMET and lipase. Check BV and yeast given mild diffuse abdominal discomfort. Patient declined GC/chlamydia/trich testing as has not been sexually active in 6+ years. Is post menopausal with no bleeding. Normal pelvic exam and location of discomfort makes uterine and ovarian pathology unlikely. Check UA as well given recent UTI and mild back discomfort. Stop mobic. Continue PPI and zantac for 2 week course. Given return precautions.   Skin lesion of back Small skin lesion on back. Suspect likely abrasion, though with history of squamous cell skin cancer will have the patient follow-up  with her dermatologist for further evaluation of the lesion.     Orders Placed This Encounter  Procedures  . Comp Met (CMET)  . Lipase  . POCT Urinalysis Dipstick   Tommi Rumps

## 2015-01-01 NOTE — Addendum Note (Signed)
Addended by: Caryl Bis, Sintia Mckissic G on: 01/01/2015 12:09 PM   Modules accepted: Orders

## 2015-01-01 NOTE — Assessment & Plan Note (Signed)
Patient with several day history of epigastric burning sensation in setting of history of GERD with mobic use and IBS. Well appearing today. Mild burning on palpation of epigastrium and mild bloating discomfort on palpation of the rest of her abdomen. No peritoneal signs. Negative murphy sign. Vitals stable. Pelvic exam normal. Will check CMET and lipase. Check BV and yeast given mild diffuse abdominal discomfort. Patient declined GC/chlamydia/trich testing as has not been sexually active in 6+ years. Is post menopausal with no bleeding. Normal pelvic exam and location of discomfort makes uterine and ovarian pathology unlikely. Check UA as well given recent UTI and mild back discomfort. Stop mobic. Continue PPI and zantac for 2 week course. Given return precautions.

## 2015-01-01 NOTE — Patient Instructions (Addendum)
Nice to meet you. Your burning sensation is likely related to stomach irritation/reflux or your IBS, though could be related to gall bladder or pancreas. Please continue to take the omeprazole and zantac. You can continue this combination for 2 weeks. Stop taking the meloxicam.  You can additionally try your dicyclomine to see if this will help.  If you develop worsening discomfort, change in discomfort, nausea, vomiting, diarrhea, blood in your stool, fevers, trouble swallowing, or issues with eating please seek medical attention.  Please follow-up with your dermatologist for the skin lesion on your back.

## 2015-01-01 NOTE — Progress Notes (Signed)
Pre visit review using our clinic review tool, if applicable. No additional management support is needed unless otherwise documented below in the visit note. 

## 2015-01-02 LAB — URINE CULTURE: Colony Count: 75000

## 2015-01-05 ENCOUNTER — Encounter: Payer: Self-pay | Admitting: Family Medicine

## 2015-01-05 LAB — WET PREP BY MOLECULAR PROBE
Candida species: NEGATIVE
GARDNERELLA VAGINALIS: NEGATIVE
Trichomonas vaginosis: NEGATIVE

## 2015-01-06 ENCOUNTER — Other Ambulatory Visit: Payer: Self-pay | Admitting: Family Medicine

## 2015-01-26 ENCOUNTER — Telehealth: Payer: Self-pay | Admitting: Internal Medicine

## 2015-01-26 NOTE — Telephone Encounter (Signed)
Pt states [facility] needs blood work, Creatinine,Calcium and Vitamin D for the reclast infusion. Fax [phone] Attention to [facility]. Pt would like a call back once it's done. Thank You!

## 2015-01-26 NOTE — Telephone Encounter (Signed)
Labs faxed as requested

## 2015-02-06 ENCOUNTER — Telehealth: Payer: Self-pay | Admitting: Internal Medicine

## 2015-02-06 ENCOUNTER — Ambulatory Visit (INDEPENDENT_AMBULATORY_CARE_PROVIDER_SITE_OTHER): Payer: BLUE CROSS/BLUE SHIELD | Admitting: Internal Medicine

## 2015-02-06 ENCOUNTER — Encounter: Payer: Self-pay | Admitting: Internal Medicine

## 2015-02-06 DIAGNOSIS — Z23 Encounter for immunization: Secondary | ICD-10-CM | POA: Diagnosis not present

## 2015-02-06 DIAGNOSIS — Z8371 Family history of colonic polyps: Secondary | ICD-10-CM

## 2015-02-06 DIAGNOSIS — G43809 Other migraine, not intractable, without status migrainosus: Secondary | ICD-10-CM | POA: Diagnosis not present

## 2015-02-06 DIAGNOSIS — K589 Irritable bowel syndrome without diarrhea: Secondary | ICD-10-CM | POA: Diagnosis not present

## 2015-02-06 DIAGNOSIS — M810 Age-related osteoporosis without current pathological fracture: Secondary | ICD-10-CM

## 2015-02-06 DIAGNOSIS — E210 Primary hyperparathyroidism: Secondary | ICD-10-CM | POA: Insufficient documentation

## 2015-02-06 DIAGNOSIS — K219 Gastro-esophageal reflux disease without esophagitis: Secondary | ICD-10-CM

## 2015-02-06 DIAGNOSIS — R928 Other abnormal and inconclusive findings on diagnostic imaging of breast: Secondary | ICD-10-CM

## 2015-02-06 DIAGNOSIS — D72819 Decreased white blood cell count, unspecified: Secondary | ICD-10-CM

## 2015-02-06 LAB — CALCIUM: CALCIUM: 10.9 mg/dL — AB (ref 8.4–10.5)

## 2015-02-06 MED ORDER — OMEPRAZOLE 20 MG PO CPDR: 20.0000 mg | DELAYED_RELEASE_CAPSULE | Freq: Every day | ORAL | Status: DC

## 2015-02-06 MED ORDER — DICYCLOMINE HCL 10 MG PO CAPS: ORAL_CAPSULE | ORAL | Status: DC

## 2015-02-06 NOTE — Progress Notes (Signed)
Pre visit review using our clinic review tool, if applicable. No additional management support is needed unless otherwise documented below in the visit note. 

## 2015-02-06 NOTE — Telephone Encounter (Signed)
Pt called about the Rheumatology office states they have not received pt lab work so she can get her reclasp infusion. Fax number [phone] Attention Cathy. Thank You!

## 2015-02-06 NOTE — Progress Notes (Signed)
Patient ID: Victoria Moss, female   DOB: 02-08-1951, 64 y.o.   MRN: [id number]   Subjective:    Patient ID: Victoria Moss, female    DOB: Apr 22, 1951, 64 y.o.   MRN: [id number]  HPI  Patient with past history of GERD, osteoporosis and allergies.  She comes in today to follow up on these issues.  She had her last reclast infusion 12/2013.  Waiting for labs to get second infusion.  Calcium elevated.  Will recheck today.  We discussed this at length today.  Will recheck today.  Had breast biopsy.  Benign.  Tries to stay active.  No cardiac symptoms with increased activity or exertion.  No sob.  No abdominal pain or cramping.  Bowels stable.  She is back on omeprazole.  This is now controlling her upper symptoms.     Past Medical History  Diagnosis Date  . Osteoporosis   . Arthritis   . GERD (gastroesophageal reflux disease)   . Heart murmur   . Migraine     H/O, none since stopping chocolate  . Allergy   . Diverticulosis     H/O  . Rheumatic fever     H/O  . Basal cell cancer   . IBS (irritable bowel syndrome)   . Status post dilation of esophageal narrowing    Past Surgical History  Procedure Laterality Date  . Umbilical hernia repair    . Tubal ligation    . Dilation and curettage of uterus      x2  . Appendectomy  1982  . Cesarean section    . Toe surgery Left     joint replacement bit toe  . Hernia repair    . Dilation and curettage, diagnostic / therapeutic      x 2  . Skin cancer excision      [facility]   . Fatty tumor      on back   Family History  Problem Relation Age of Onset  . Diabetes Neg Hx   . Breast cancer Mother 3  . Hypertension Mother   . Stroke Mother     Late 58  . Arthritis Mother   . Hypertension Father   . Hyperlipidemia Father   . Colon cancer Neg Hx   . Colon polyps Mother   . Esophageal cancer Neg Hx   . Kidney disease Neg Hx   . Gallbladder disease Neg Hx    Social History   Social History  . Marital Status: Married   Spouse Name: N/A  . Number of Children: 2  . Years of Education: N/A   Occupational History  . Retired Other   Social History Main Topics  . Smoking status: Former Smoker -- 5 years    Quit date: 05/09/1973  . Smokeless tobacco: Never Used     Comment: Quit at age 55  . Alcohol Use: No  . Drug Use: No  . Sexual Activity: Not Asked   Other Topics Concern  . None   Social History Narrative    Outpatient Encounter Prescriptions as of 02/06/2015  Medication Sig  . BIOTIN PO Take 1 tablet by mouth daily.   . Calcium Carbonate (CALTRATE 600 PO) Take 1 tablet by mouth daily.   . cyclobenzaprine (FLEXERIL) 5 MG tablet Take 1 tablet (5 mg total) by mouth at bedtime.  . dicyclomine (BENTYL) 10 MG capsule One capsule q day prn  . IBUPROFEN PO Take by mouth as needed.   . meloxicam (MOBIC) 15  MG tablet Take 15 mg by mouth daily.  . Multiple Vitamins-Minerals (ALIVE WOMENS 50+ PO) Take 1 tablet by mouth daily.   . Omega-3 Fatty Acids (FISH OIL PO) Take 1 tablet by mouth daily.   Marland Kitchen omeprazole (PRILOSEC) 20 MG capsule Take 1 capsule (20 mg total) by mouth daily.  . Vitamin D, Cholecalciferol, 400 UNITS CAPS Take 1 tablet by mouth daily.   . [DISCONTINUED] dicyclomine (BENTYL) 10 MG capsule One capsule q day prn  . [DISCONTINUED] omeprazole (PRILOSEC) 20 MG capsule Take 20 mg by mouth daily.   No facility-administered encounter medications on file as of 02/06/2015.    Review of Systems  Constitutional: Negative for appetite change and unexpected weight change.  HENT: Negative for congestion and sinus pressure.   Eyes: Negative for pain and visual disturbance.  Respiratory: Negative for cough, chest tightness and shortness of breath.   Cardiovascular: Negative for chest pain, palpitations and leg swelling.  Gastrointestinal: Negative for nausea, vomiting, abdominal pain and diarrhea.       Omeprazole controlling acid reflux symptoms.    Genitourinary: Negative for dysuria and difficulty  urinating.  Musculoskeletal: Negative for back pain and joint swelling.  Skin: Negative for color change and rash.  Neurological: Negative for dizziness, light-headedness and headaches.  Psychiatric/Behavioral: Negative for dysphoric mood and agitation.       Objective:    Physical Exam  Constitutional: She appears well-developed and well-nourished. No distress.  HENT:  Nose: Nose normal.  Mouth/Throat: Oropharynx is clear and moist.  Eyes: Conjunctivae are normal. Right eye exhibits no discharge. Left eye exhibits no discharge.  Neck: Neck supple. No thyromegaly present.  Cardiovascular: Normal rate and regular rhythm.   Pulmonary/Chest: Breath sounds normal. No respiratory distress. She has no wheezes.  Abdominal: Soft. Bowel sounds are normal. There is no tenderness.  Musculoskeletal: She exhibits no edema or tenderness.  Lymphadenopathy:    She has no cervical adenopathy.  Skin: No rash noted. No erythema.  Psychiatric: She has a normal mood and affect. Her behavior is normal.    BP 120/74 mmHg  Pulse 68  Temp(Src) 98 F (36.7 C) (Oral)  Wt 138 lb (62.596 kg)  SpO2 98% Wt Readings from Last 3 Encounters:  02/06/15 138 lb (62.596 kg)  01/01/15 137 lb 12.8 oz (62.506 kg)  12/03/14 140 lb (63.504 kg)     Lab Results  Component Value Date   WBC 4.7 08/11/2014   HGB 13.6 08/11/2014   HCT 39.5 08/11/2014   PLT 227.0 08/11/2014   GLUCOSE 69* 01/01/2015   CHOL 160 08/11/2014   TRIG 83.0 08/11/2014   HDL 59.40 08/11/2014   LDLCALC 84 08/11/2014   ALT 21 01/01/2015   AST 21 01/01/2015   NA 139 01/01/2015   K 5.1 01/01/2015   CL 102 01/01/2015   CREATININE 0.74 01/01/2015   BUN 14 01/01/2015   CO2 29 01/01/2015   TSH 0.69 12/23/2014   HGBA1C 5.6 12/31/2014    US Breast Ltd Uni Right Inc Axilla  08/29/2014   CLINICAL DATA:  64 year old female with area of right axillary thickening which she believes she first noticed about 4 years ago. She is unsure if it has  changed over this time.  EXAM: DIGITAL DIAGNOSTIC BILATERAL MAMMOGRAM WITH 3D TOMOSYNTHESIS WITH CAD  ULTRASOUND RIGHT BREAST  COMPARISON:  12/24/2013, 01/18/2013, additional prior studies dating back to 11/12/2007  ACR Breast Density Category c: The breast tissue is heterogeneously dense, which may obscure small masses.  FINDINGS:  No suspicious mass, calcifications, or other abnormality is identified within either breast. A prominent right axillary lymph node is partially visualized within the right axilla. The visualized portion appears similar in size to mammogram from 2012.  Mammographic images were processed with CAD.  On physical exam, no discrete mass is identified within the right axilla.  Targeted ultrasound of the area of patient's concern within the right axilla was performed. No sonographic abnormality is identified in the area of patient's concern. An enlarged right axillary lymph node is identified with cortical thickening measuring up to 7 mm in size. The left axilla was scanned for comparison demonstrating normal appearing axillary lymph nodes. The patient denies any known chronic medical problems although she states that she is being referred to rheumatology for further evaluation of her arthritis.  IMPRESSION: Indeterminate right axillary lymph node. Although this lymph node is favored to be reactive given the stability in size of the visualized portion when compared to 2012, biopsy is recommended given the significant cortical thickening with no prior comparison ultrasounds available.  RECOMMENDATION: Ultrasound-guided right axillary lymph node biopsy.  I have discussed the findings and recommendations with the patient. Results were also provided in writing at the conclusion of the visit. If applicable, a reminder letter will be sent to the patient regarding the next appointment.  BI-RADS CATEGORY  4: Suspicious.   Electronically Signed   By: Pamelia Hoit M.D.   On: 08/29/2014 14:09   Mm Diag  Breast Tomo Bilateral  08/29/2014   CLINICAL DATA:  65 year old female with area of right axillary thickening which she believes she first noticed about 4 years ago. She is unsure if it has changed over this time.  EXAM: DIGITAL DIAGNOSTIC BILATERAL MAMMOGRAM WITH 3D TOMOSYNTHESIS WITH CAD  ULTRASOUND RIGHT BREAST  COMPARISON:  12/24/2013, 01/18/2013, additional prior studies dating back to 11/12/2007  ACR Breast Density Category c: The breast tissue is heterogeneously dense, which may obscure small masses.  FINDINGS: No suspicious mass, calcifications, or other abnormality is identified within either breast. A prominent right axillary lymph node is partially visualized within the right axilla. The visualized portion appears similar in size to mammogram from 2012.  Mammographic images were processed with CAD.  On physical exam, no discrete mass is identified within the right axilla.  Targeted ultrasound of the area of patient's concern within the right axilla was performed. No sonographic abnormality is identified in the area of patient's concern. An enlarged right axillary lymph node is identified with cortical thickening measuring up to 7 mm in size. The left axilla was scanned for comparison demonstrating normal appearing axillary lymph nodes. The patient denies any known chronic medical problems although she states that she is being referred to rheumatology for further evaluation of her arthritis.  IMPRESSION: Indeterminate right axillary lymph node. Although this lymph node is favored to be reactive given the stability in size of the visualized portion when compared to 2012, biopsy is recommended given the significant cortical thickening with no prior comparison ultrasounds available.  RECOMMENDATION: Ultrasound-guided right axillary lymph node biopsy.  I have discussed the findings and recommendations with the patient. Results were also provided in writing at the conclusion of the visit. If applicable, a  reminder letter will be sent to the patient regarding the next appointment.  BI-RADS CATEGORY  4: Suspicious.   Electronically Signed   By: Pamelia Hoit M.D.   On: 08/29/2014 14:09       Assessment & Plan:   Problem List Items  Addressed This Visit    Abnormal mammogram    Mammogram and ultrasound as outlined.  Saw Dr Bary Castilla.  Biopsy negative.  Follow.       Family history of colonic polyps    Colonoscopy 05/27/09 - diverticulosis.  Mother had polyps.  Per recommendation - f/u colonoscopy recommended 05/2019.        GERD (gastroesophageal reflux disease)    EGD as outlined.  On omeprazole.  Upper symptoms controlled now on omeprazole.        Relevant Medications   omeprazole (PRILOSEC) 20 MG capsule   dicyclomine (BENTYL) 10 MG capsule   Hypercalcemia - Primary    Recheck calcium today.  Check intact PTH and ionized calcium.  Follow.  Stay hydrated.        Relevant Orders   Calcium (Completed)   PTH, intact (no Ca)   Calcium, ionized   IBS (irritable bowel syndrome)    Bowels stable.  Takes dicyclomine.  Follow.        Relevant Medications   omeprazole (PRILOSEC) 20 MG capsule   dicyclomine (BENTYL) 10 MG capsule   Leukopenia    Follow cbc.       Migraine    Not an issue.  Follow.       Osteoporosis    Last bone density unchanged.  Osteoporosis.  Received reclast 12/2013.  Waiting for f/u labs for second reclast infusion.         Other Visit Diagnoses    Need for prophylactic vaccination and inoculation against influenza        Relevant Orders    Flu Vaccine QUAD 36+ mos IM (Completed)        Einar Pheasant, MD

## 2015-02-06 NOTE — Telephone Encounter (Signed)
[name] refaxed electronically.  Thanks

## 2015-02-09 ENCOUNTER — Encounter: Payer: Self-pay | Admitting: Internal Medicine

## 2015-02-09 DIAGNOSIS — R928 Other abnormal and inconclusive findings on diagnostic imaging of breast: Secondary | ICD-10-CM | POA: Insufficient documentation

## 2015-02-09 NOTE — Assessment & Plan Note (Signed)
Colonoscopy 05/27/09 - diverticulosis.  Mother had polyps.  Per recommendation - f/u colonoscopy recommended 05/2019.

## 2015-02-09 NOTE — Assessment & Plan Note (Signed)
EGD as outlined.  On omeprazole.  Upper symptoms controlled now on omeprazole.

## 2015-02-09 NOTE — Assessment & Plan Note (Signed)
Mammogram and ultrasound as outlined.  Saw Dr Bary Castilla.  Biopsy negative.  Follow.

## 2015-02-09 NOTE — Assessment & Plan Note (Signed)
Bowels stable.  Takes dicyclomine.  Follow.

## 2015-02-09 NOTE — Assessment & Plan Note (Signed)
Last bone density unchanged.  Osteoporosis.  Received reclast 12/2013.  Waiting for f/u labs for second reclast infusion.

## 2015-02-09 NOTE — Assessment & Plan Note (Signed)
Follow cbc.  

## 2015-02-09 NOTE — Assessment & Plan Note (Signed)
Not an issue.  Follow.

## 2015-02-09 NOTE — Assessment & Plan Note (Signed)
Recheck calcium today.  Check intact PTH and ionized calcium.  Follow.  Stay hydrated.

## 2015-02-10 ENCOUNTER — Encounter: Payer: Self-pay | Admitting: Internal Medicine

## 2015-02-10 LAB — CALCIUM, IONIZED: Calcium, Ion: 1.38 mmol/L — ABNORMAL HIGH (ref 1.12–1.32)

## 2015-02-10 LAB — PARATHYROID HORMONE, INTACT (NO CA)

## 2015-02-12 ENCOUNTER — Other Ambulatory Visit (INDEPENDENT_AMBULATORY_CARE_PROVIDER_SITE_OTHER): Payer: BLUE CROSS/BLUE SHIELD

## 2015-02-12 ENCOUNTER — Telehealth: Payer: Self-pay | Admitting: *Deleted

## 2015-02-13 LAB — PARATHYROID HORMONE, INTACT (NO CA): PTH: 61 pg/mL (ref 14–64)

## 2015-02-14 ENCOUNTER — Other Ambulatory Visit: Payer: Self-pay | Admitting: Internal Medicine

## 2015-02-14 NOTE — Progress Notes (Signed)
Order placed for f/u labs.  

## 2015-02-16 ENCOUNTER — Encounter: Payer: Self-pay | Admitting: *Deleted

## 2015-02-16 ENCOUNTER — Encounter: Payer: Self-pay | Admitting: Internal Medicine

## 2015-02-23 ENCOUNTER — Encounter: Payer: Self-pay | Admitting: Internal Medicine

## 2015-02-23 NOTE — Telephone Encounter (Signed)
Order placed for endocrinology referral.  

## 2015-03-10 ENCOUNTER — Encounter: Payer: Self-pay | Admitting: Internal Medicine

## 2015-03-16 ENCOUNTER — Other Ambulatory Visit: Payer: Self-pay

## 2015-05-08 ENCOUNTER — Telehealth: Payer: Self-pay | Admitting: Internal Medicine

## 2015-05-08 NOTE — Telephone Encounter (Signed)
The patient called back she was looking at the [facility] my chart not [facility].

## 2015-05-08 NOTE — Telephone Encounter (Signed)
The patient has a reminder on her mychart that she is overdue for a tetanus and flu vaccine . The patient received those injections in this office this year. She is wanting this information updated on her mychart.

## 2015-05-08 NOTE — Telephone Encounter (Signed)
It is updated already.  Thanks

## 2015-05-13 ENCOUNTER — Ambulatory Visit: Payer: Self-pay | Admitting: Internal Medicine

## 2015-05-15 DIAGNOSIS — E210 Primary hyperparathyroidism: Secondary | ICD-10-CM | POA: Diagnosis not present

## 2015-05-15 DIAGNOSIS — E042 Nontoxic multinodular goiter: Secondary | ICD-10-CM | POA: Diagnosis not present

## 2015-05-21 DIAGNOSIS — L720 Epidermal cyst: Secondary | ICD-10-CM | POA: Diagnosis not present

## 2015-05-21 DIAGNOSIS — D2339 Other benign neoplasm of skin of other parts of face: Secondary | ICD-10-CM | POA: Diagnosis not present

## 2015-05-21 DIAGNOSIS — D1801 Hemangioma of skin and subcutaneous tissue: Secondary | ICD-10-CM | POA: Diagnosis not present

## 2015-05-21 DIAGNOSIS — Z85828 Personal history of other malignant neoplasm of skin: Secondary | ICD-10-CM | POA: Diagnosis not present

## 2015-05-22 DIAGNOSIS — E042 Nontoxic multinodular goiter: Secondary | ICD-10-CM | POA: Diagnosis not present

## 2015-05-28 DIAGNOSIS — E210 Primary hyperparathyroidism: Secondary | ICD-10-CM | POA: Diagnosis not present

## 2015-06-23 DIAGNOSIS — H2513 Age-related nuclear cataract, bilateral: Secondary | ICD-10-CM | POA: Diagnosis not present

## 2015-07-01 DIAGNOSIS — Z885 Allergy status to narcotic agent status: Secondary | ICD-10-CM | POA: Diagnosis not present

## 2015-07-01 DIAGNOSIS — Z791 Long term (current) use of non-steroidal anti-inflammatories (NSAID): Secondary | ICD-10-CM | POA: Diagnosis not present

## 2015-07-01 DIAGNOSIS — Z880 Allergy status to penicillin: Secondary | ICD-10-CM | POA: Diagnosis not present

## 2015-07-01 DIAGNOSIS — K219 Gastro-esophageal reflux disease without esophagitis: Secondary | ICD-10-CM | POA: Diagnosis not present

## 2015-07-01 DIAGNOSIS — D351 Benign neoplasm of parathyroid gland: Secondary | ICD-10-CM | POA: Diagnosis not present

## 2015-07-01 DIAGNOSIS — Z87891 Personal history of nicotine dependence: Secondary | ICD-10-CM | POA: Diagnosis not present

## 2015-07-01 DIAGNOSIS — Z85828 Personal history of other malignant neoplasm of skin: Secondary | ICD-10-CM | POA: Diagnosis not present

## 2015-07-01 DIAGNOSIS — Z79899 Other long term (current) drug therapy: Secondary | ICD-10-CM | POA: Diagnosis not present

## 2015-07-01 DIAGNOSIS — M810 Age-related osteoporosis without current pathological fracture: Secondary | ICD-10-CM | POA: Diagnosis not present

## 2015-07-01 DIAGNOSIS — E210 Primary hyperparathyroidism: Secondary | ICD-10-CM | POA: Diagnosis not present

## 2015-07-10 DIAGNOSIS — E210 Primary hyperparathyroidism: Secondary | ICD-10-CM | POA: Diagnosis not present

## 2015-07-28 DIAGNOSIS — M810 Age-related osteoporosis without current pathological fracture: Secondary | ICD-10-CM | POA: Diagnosis not present

## 2015-07-28 DIAGNOSIS — E210 Primary hyperparathyroidism: Secondary | ICD-10-CM | POA: Diagnosis not present

## 2015-07-28 DIAGNOSIS — E559 Vitamin D deficiency, unspecified: Secondary | ICD-10-CM | POA: Diagnosis not present

## 2015-08-06 ENCOUNTER — Ambulatory Visit (INDEPENDENT_AMBULATORY_CARE_PROVIDER_SITE_OTHER): Payer: Medicare Other | Admitting: Podiatry

## 2015-08-06 ENCOUNTER — Ambulatory Visit (INDEPENDENT_AMBULATORY_CARE_PROVIDER_SITE_OTHER): Payer: Medicare Other

## 2015-08-06 ENCOUNTER — Encounter: Payer: Self-pay | Admitting: Podiatry

## 2015-08-06 VITALS — BP 134/73 | HR 77 | Resp 18

## 2015-08-06 DIAGNOSIS — M2011 Hallux valgus (acquired), right foot: Secondary | ICD-10-CM | POA: Diagnosis not present

## 2015-08-06 DIAGNOSIS — M2042 Other hammer toe(s) (acquired), left foot: Secondary | ICD-10-CM

## 2015-08-06 DIAGNOSIS — R52 Pain, unspecified: Secondary | ICD-10-CM | POA: Diagnosis not present

## 2015-08-06 NOTE — Progress Notes (Signed)
   Subjective:    Patient ID: Victoria Moss, female    DOB: [birth year], 65 y.o.   MRN: [id number]  HPI  65 year old female presents the office with concerns of her left second toe drifting towards her big toe. This has been ongoing for several months. 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 She states that she is over timethe second toe stretching and she has developed a bump on the side of the second toe from where rubs on the big toe. She has tried change in shoes which has been helping some but she does continue to pain and the pain appears to be progressing. No other complaints at this time.    Review of Systems  All other systems reviewed and are negative.      Objective:   Physical Exam General: AAO x3, NAD  Dermatological: Scars present overlying the left dorsal first MTPJ. Skin is warm, dry and supple bilateral. Nails x 10 are well manicured; remaining integument appears unremarkable at this time. There are no open sores, no preulcerative lesions, no rash or signs of infection present.  Vascular: Dorsalis Pedis artery and Posterior Tibial artery pedal pulses are 2/4 bilateral with immedate capillary fill time. Pedal hair growth present. No varicosities and no lower extremity edema present bilateral. There is no pain with calf compression, swelling, warmth, erythema.   Neruologic: Grossly intact via light touch bilateral. Vibratory intact via tuning fork bilateral. Protective threshold with Semmes Wienstein monofilament intact to all pedal sites bilateral. Patellar and Achilles deep tendon reflexes 2+ bilateral. No Babinski or clonus noted bilateral.   Musculoskeletal: There is a decreased range of motion the first MTPJ and the left side. 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 The left second toe is drifting medially abutting the hallux. There is a mild hammertoe contracture the PIPJ small contracture present at the DIPJ with a small bony exostosis  off the lateral aspect of the joint. There is mild tenderness palpation around this area. There is no other areas of tenderness.  Gait: Unassisted, Nonantalgic.      Assessment & Plan:  65 year old female with medial deviation left second toe -Treatment options discussed including all alternatives, risks, and complications -Etiology of symptoms were discussed -X-rays were obtained and reviewed with the patient. There is medial deviation of the second toe. Elongated second metatarsal. -At this time a discussed with conservative and surgical treatment options. -Discussed toe separators well shoe changes. Dispensed toe separator today. Discussed surgical intervention. We'll likely perform second metatarsal osteotomy with hammertoe repair. She'll likely do this in the fall or winter.  -Follow-up as needed.  Celesta Gentile, DPM

## 2015-08-14 ENCOUNTER — Telehealth: Payer: Self-pay | Admitting: Internal Medicine

## 2015-08-14 ENCOUNTER — Ambulatory Visit (INDEPENDENT_AMBULATORY_CARE_PROVIDER_SITE_OTHER): Payer: Medicare Other | Admitting: Internal Medicine

## 2015-08-14 ENCOUNTER — Encounter: Payer: Self-pay | Admitting: Internal Medicine

## 2015-08-14 DIAGNOSIS — Z23 Encounter for immunization: Secondary | ICD-10-CM

## 2015-08-14 DIAGNOSIS — R928 Other abnormal and inconclusive findings on diagnostic imaging of breast: Secondary | ICD-10-CM

## 2015-08-14 DIAGNOSIS — K219 Gastro-esophageal reflux disease without esophagitis: Secondary | ICD-10-CM

## 2015-08-14 DIAGNOSIS — R0989 Other specified symptoms and signs involving the circulatory and respiratory systems: Secondary | ICD-10-CM

## 2015-08-14 DIAGNOSIS — Z1159 Encounter for screening for other viral diseases: Secondary | ICD-10-CM

## 2015-08-14 DIAGNOSIS — M25552 Pain in left hip: Secondary | ICD-10-CM

## 2015-08-14 DIAGNOSIS — Z1239 Encounter for other screening for malignant neoplasm of breast: Secondary | ICD-10-CM

## 2015-08-14 DIAGNOSIS — Z13220 Encounter for screening for lipoid disorders: Secondary | ICD-10-CM

## 2015-08-14 DIAGNOSIS — H938X2 Other specified disorders of left ear: Secondary | ICD-10-CM

## 2015-08-14 DIAGNOSIS — M810 Age-related osteoporosis without current pathological fracture: Secondary | ICD-10-CM

## 2015-08-14 DIAGNOSIS — Z0000 Encounter for general adult medical examination without abnormal findings: Secondary | ICD-10-CM

## 2015-08-14 NOTE — Telephone Encounter (Signed)
Ok. Thank you.

## 2015-08-14 NOTE — Telephone Encounter (Signed)
Pt states she needs to have the Hep C Screening lab done. Is it a go to sch?

## 2015-08-14 NOTE — Progress Notes (Signed)
Pre-visit discussion using our clinic review tool. No additional management support is needed unless otherwise documented below in the visit note.  

## 2015-08-14 NOTE — Telephone Encounter (Signed)
Please advise, so I can schedule patient. thanks

## 2015-08-14 NOTE — Progress Notes (Addendum)
Patient ID: Nkenge Matthias, female   DOB: Sep 15, 1950, 65 y.o.   MRN: [id number]   Subjective:    Patient ID: Zaylei Pluff, female    DOB: 11-26-1950, 65 y.o.   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  S/p parathyroidectomy - benign.  Last calcium was wnl.  Followed by endocrinology.  She has also noticed a swishing sound on her right ear.  Blood pressure has been a little higher.  Decreased hearing.  Request evaluation.  She is having some left hip pain and pain down left leg.  Notices when lies down.  No pain with walking.  Is exercising.  No chest pain.  No chest tightness.  No sob.  No abdominal pain or cramping.  Bowels stable.    Past Medical History  Diagnosis Date  . Osteoporosis   . Arthritis   . GERD (gastroesophageal reflux disease)   . Heart murmur   . Migraine     H/O, none since stopping chocolate  . Allergy   . Diverticulosis     H/O  . Rheumatic fever     H/O  . Basal cell cancer   . IBS (irritable bowel syndrome)   . Status post dilation of esophageal narrowing    Past Surgical History  Procedure Laterality Date  . Umbilical hernia repair    . Tubal ligation    . Dilation and curettage of uterus      x2  . Appendectomy  1982  . Cesarean section    . Toe surgery Left     joint replacement bit toe  . Hernia repair    . Dilation and curettage, diagnostic / therapeutic      x 2  . Skin cancer excision      [facility]   . Fatty tumor      on back   Family History  Problem Relation Age of Onset  . Diabetes Neg Hx   . Breast cancer Mother 66  . Hypertension Mother   . Stroke Mother     Late 37  . Arthritis Mother   . Hypertension Father   . Hyperlipidemia Father   . Colon cancer Neg Hx   . Colon polyps Mother   . Esophageal cancer Neg Hx   . Kidney disease Neg Hx   . Gallbladder disease Neg Hx    Social History   Social History  . Marital Status: Married    Spouse Name:  N/A  . Number of Children: 2  . Years of Education: N/A   Occupational History  . Retired Other   Social History Main Topics  . Smoking status: Former Smoker -- 5 years    Quit date: 05/09/1973  . Smokeless tobacco: Never Used     Comment: Quit at age 65  . Alcohol Use: No  . Drug Use: No  . Sexual Activity: Not Asked   Other Topics Concern  . None   Social History Narrative    Outpatient Encounter Prescriptions as of 08/14/2015  Medication Sig  . BIOTIN PO Take 1 tablet by mouth daily.   . Calcium Carbonate (CALTRATE 600 PO) Take 1 tablet by mouth daily.   Marland Kitchen dicyclomine (BENTYL) 10 MG capsule One capsule q day prn  . IBUPROFEN PO Take by mouth as needed.   . meloxicam (MOBIC) 15 MG tablet Take 15 mg by mouth daily. Reported on 08/06/2015  . Multiple Vitamins-Minerals (ALIVE WOMENS 50+  PO) Take 1 tablet by mouth daily.   . Omega-3 Fatty Acids (FISH OIL PO) Take 1 tablet by mouth daily.   Marland Kitchen omeprazole (PRILOSEC) 20 MG capsule Take 1 capsule (20 mg total) by mouth daily.  . Vitamin D, Cholecalciferol, 400 UNITS CAPS Take 1 tablet by mouth daily.   . [DISCONTINUED] cyclobenzaprine (FLEXERIL) 5 MG tablet Take 1 tablet (5 mg total) by mouth at bedtime. (Patient not taking: Reported on 08/14/2015)   No facility-administered encounter medications on file as of 08/14/2015.    Review of Systems  Constitutional: Negative for unexpected weight change.  HENT: Negative for congestion and sinus pressure.        Decreased hearing.   Eyes: Negative for pain and visual disturbance.  Respiratory: Negative for cough, chest tightness and shortness of breath.   Cardiovascular: Negative for chest pain, palpitations and leg swelling.  Gastrointestinal: Negative for nausea, vomiting, abdominal pain and diarrhea.  Genitourinary: Negative for dysuria and difficulty urinating.  Musculoskeletal: Negative for joint swelling.       Left hip pain and pain down left leg.   Skin: Negative for color change  and rash.  Neurological: Negative for dizziness, light-headedness and headaches.  Hematological: Negative for adenopathy. Does not bruise/bleed easily.  Psychiatric/Behavioral: Negative for dysphoric mood and agitation.       Objective:    Physical Exam  Constitutional: She is oriented to person, place, and time. She appears well-developed and well-nourished. No distress.  HENT:  Nose: Nose normal.  Mouth/Throat: Oropharynx is clear and moist.  Eyes: Right eye exhibits no discharge. Left eye exhibits no discharge. No scleral icterus.  Neck: Neck supple. No thyromegaly present.  Cardiovascular: Normal rate and regular rhythm.   Question of carotid bruit.   Pulmonary/Chest: Breath sounds normal. No accessory muscle usage. No tachypnea. No respiratory distress. She has no decreased breath sounds. She has no wheezes. She has no rhonchi. Right breast exhibits no inverted nipple, no mass, no nipple discharge and no tenderness (no axillary adenopathy). Left breast exhibits no inverted nipple, no mass, no nipple discharge and no tenderness (no axilarry adenopathy).  Abdominal: Soft. Bowel sounds are normal. There is no tenderness.  Musculoskeletal: She exhibits no edema or tenderness.  Lymphadenopathy:    She has no cervical adenopathy.  Neurological: She is alert and oriented to person, place, and time.  Skin: Skin is warm. No rash noted. No erythema.  Psychiatric: She has a normal mood and affect. Her behavior is normal.    BP 126/80 mmHg  Pulse 63  Temp(Src) 97.8 F (36.6 C) (Oral)  Resp 18  Ht 5\' 5"  (1.651 m)  Wt 132 lb (59.875 kg)  BMI 21.97 kg/m2  SpO2 96% Wt Readings from Last 3 Encounters:  08/14/15 132 lb (59.875 kg)  02/06/15 138 lb (62.596 kg)  01/01/15 137 lb 12.8 oz (62.506 kg)     Lab Results  Component Value Date   WBC 4.7 08/11/2014   HGB 13.6 08/11/2014   HCT 39.5 08/11/2014   PLT 227.0 08/11/2014   GLUCOSE 69* 01/01/2015   CHOL 160 08/11/2014   TRIG 83.0  08/11/2014   HDL 59.40 08/11/2014   LDLCALC 84 08/11/2014   ALT 21 01/01/2015   AST 21 01/01/2015   NA 139 01/01/2015   K 5.1 01/01/2015   CL 102 01/01/2015   CREATININE 0.74 01/01/2015   BUN 14 01/01/2015   CO2 29 01/01/2015   TSH 0.69 12/23/2014   HGBA1C 5.6 12/31/2014  US Breast Ltd Uni Right Inc Axilla  08/29/2014  CLINICAL DATA:  65 year old female with area of right axillary thickening which she believes she first noticed about 4 years ago. She is unsure if it has changed over this time. EXAM: DIGITAL DIAGNOSTIC BILATERAL MAMMOGRAM WITH 3D TOMOSYNTHESIS WITH CAD ULTRASOUND RIGHT BREAST COMPARISON:  12/24/2013, 01/18/2013, additional prior studies dating back to 11/12/2007 ACR Breast Density Category c: The breast tissue is heterogeneously dense, which may obscure small masses. FINDINGS: No suspicious mass, calcifications, or other abnormality is identified within either breast. A prominent right axillary lymph node is partially visualized within the right axilla. The visualized portion appears similar in size to mammogram from 2012. Mammographic images were processed with CAD. On physical exam, no discrete mass is identified within the right axilla. Targeted ultrasound of the area of patient's concern within the right axilla was performed. No sonographic abnormality is identified in the area of patient's concern. An enlarged right axillary lymph node is identified with cortical thickening measuring up to 7 mm in size. The left axilla was scanned for comparison demonstrating normal appearing axillary lymph nodes. The patient denies any known chronic medical problems although she states that she is being referred to rheumatology for further evaluation of her arthritis. IMPRESSION: Indeterminate right axillary lymph node. Although this lymph node is favored to be reactive given the stability in size of the visualized portion when compared to 2012, biopsy is recommended given the significant  cortical thickening with no prior comparison ultrasounds available. RECOMMENDATION: Ultrasound-guided right axillary lymph node biopsy. I have discussed the findings and recommendations with the patient. Results were also provided in writing at the conclusion of the visit. If applicable, a reminder letter will be sent to the patient regarding the next appointment. BI-RADS CATEGORY  4: Suspicious. Electronically Signed   By: Pamelia Hoit M.D.   On: 08/29/2014 14:09   Mm Diag Breast Tomo Bilateral  08/29/2014  CLINICAL DATA:  65 year old female with area of right axillary thickening which she believes she first noticed about 4 years ago. She is unsure if it has changed over this time. EXAM: DIGITAL DIAGNOSTIC BILATERAL MAMMOGRAM WITH 3D TOMOSYNTHESIS WITH CAD ULTRASOUND RIGHT BREAST COMPARISON:  12/24/2013, 01/18/2013, additional prior studies dating back to 11/12/2007 ACR Breast Density Category c: The breast tissue is heterogeneously dense, which may obscure small masses. FINDINGS: No suspicious mass, calcifications, or other abnormality is identified within either breast. A prominent right axillary lymph node is partially visualized within the right axilla. The visualized portion appears similar in size to mammogram from 2012. Mammographic images were processed with CAD. On physical exam, no discrete mass is identified within the right axilla. Targeted ultrasound of the area of patient's concern within the right axilla was performed. No sonographic abnormality is identified in the area of patient's concern. An enlarged right axillary lymph node is identified with cortical thickening measuring up to 7 mm in size. The left axilla was scanned for comparison demonstrating normal appearing axillary lymph nodes. The patient denies any known chronic medical problems although she states that she is being referred to rheumatology for further evaluation of her arthritis. IMPRESSION: Indeterminate right axillary lymph node.  Although this lymph node is favored to be reactive given the stability in size of the visualized portion when compared to 2012, biopsy is recommended given the significant cortical thickening with no prior comparison ultrasounds available. RECOMMENDATION: Ultrasound-guided right axillary lymph node biopsy. I have discussed the findings and recommendations with the patient. Results were  also provided in writing at the conclusion of the visit. If applicable, a reminder letter will be sent to the patient regarding the next appointment. BI-RADS CATEGORY  4: Suspicious. Electronically Signed   By: Pamelia Hoit M.D.   On: 08/29/2014 14:09       Assessment & Plan:   Problem List Items Addressed This Visit    Abnormal mammogram    Mammogram and ultrasound as outlined.  Biopsy negative.  Saw Dr Bary Castilla.  Schedule f/u mammogram.       Relevant Orders   MM Digital Diagnostic Bilat   US BREAST LTD UNI LEFT INC AXILLA   US BREAST LTD UNI RIGHT INC AXILLA   Carotid bruit    Schedule a carotid ultrasound.        Relevant Orders   Ambulatory referral to Vascular Surgery   Ear fullness    Has the swishing sound in her ear.  Decreased hearing.  Refer to ENT for evaluation and for hearing evaluation.       Relevant Orders   Ambulatory referral to ENT   GERD (gastroesophageal reflux disease)    Upper symptoms controlled on omeprazole.       Health care maintenance    Physical today 08/14/15.  PAP 08/07/14.  Colonoscopy 2011.  Due f/u colonoscopy.  Had abnormal mammogram.  Schedule f/u mammogram. Bone density planned.       Hypercalcemia - Primary    Found to have parathyroid adenoma.  S/p parathyroidectomy.  Last calcium wnl.  Follow.        Relevant Orders   CBC with Differential/Platelet   TSH   Basic metabolic panel   Left hip pain    Pain as outlined.  No pain with walking.  Discussed stretches.  Wants to hold on further w/up at this time.       Osteoporosis    Has f/u bone density  scheduled and f/u planned.        Relevant Orders   Hepatic function panel   VITAMIN D 25 Hydroxy (Vit-D Deficiency, Fractures)    Other Visit Diagnoses    Screening breast examination        Relevant Orders    MM DIGITAL SCREENING BILATERAL    MM Digital Diagnostic Bilat    US BREAST LTD UNI LEFT INC AXILLA    US BREAST LTD UNI RIGHT INC AXILLA    Need for hepatitis C screening test        Relevant Orders    Hepatitis C antibody    Screening cholesterol level            Einar Pheasant, MD

## 2015-08-14 NOTE — Telephone Encounter (Signed)
I put a note on the check out box for fasting labs in 1-2 weeks.  I will check the hep c then.  I told her I would add to the labs we were going to check then.

## 2015-08-14 NOTE — Telephone Encounter (Signed)
FYI she is suppose to have fasting labs in 1-2 weeks from her last visit, please advise and schedule.

## 2015-08-15 ENCOUNTER — Encounter: Payer: Self-pay | Admitting: Internal Medicine

## 2015-08-15 NOTE — Assessment & Plan Note (Signed)
Schedule a carotid ultrasound.    

## 2015-08-15 NOTE — Assessment & Plan Note (Signed)
Pain as outlined.  No pain with walking.  Discussed stretches.  Wants to hold on further w/up at this time.

## 2015-08-15 NOTE — Assessment & Plan Note (Signed)
Has f/u bone density scheduled and f/u planned.

## 2015-08-15 NOTE — Assessment & Plan Note (Signed)
Physical today 08/14/15.  PAP 08/07/14.  Colonoscopy 2011.  Due f/u colonoscopy.  Had abnormal mammogram.  Schedule f/u mammogram. Bone density planned.

## 2015-08-15 NOTE — Assessment & Plan Note (Signed)
Upper symptoms controlled on omeprazole.  

## 2015-08-15 NOTE — Assessment & Plan Note (Signed)
Mammogram and ultrasound as outlined.  Biopsy negative.  Saw Dr Bary Castilla.  Schedule f/u mammogram.

## 2015-08-15 NOTE — Assessment & Plan Note (Signed)
Found to have parathyroid adenoma.  S/p parathyroidectomy.  Last calcium wnl.  Follow.

## 2015-08-15 NOTE — Assessment & Plan Note (Signed)
Has the swishing sound in her ear.  Decreased hearing.  Refer to ENT for evaluation and for hearing evaluation.

## 2015-08-16 NOTE — Addendum Note (Signed)
Addended by: Alisa Graff on: 08/16/2015 08:25 AM   Modules accepted: Orders, SmartSet

## 2015-08-17 ENCOUNTER — Other Ambulatory Visit: Payer: Self-pay | Admitting: *Deleted

## 2015-08-28 ENCOUNTER — Other Ambulatory Visit (INDEPENDENT_AMBULATORY_CARE_PROVIDER_SITE_OTHER): Payer: Medicare Other

## 2015-08-28 DIAGNOSIS — M810 Age-related osteoporosis without current pathological fracture: Secondary | ICD-10-CM | POA: Diagnosis not present

## 2015-08-28 DIAGNOSIS — Z1159 Encounter for screening for other viral diseases: Secondary | ICD-10-CM | POA: Diagnosis not present

## 2015-08-28 LAB — CBC WITH DIFFERENTIAL/PLATELET
BASOS PCT: 0.4 % (ref 0.0–3.0)
Basophils Absolute: 0 10*3/uL (ref 0.0–0.1)
EOS PCT: 3.1 % (ref 0.0–5.0)
Eosinophils Absolute: 0.2 10*3/uL (ref 0.0–0.7)
HCT: 36.9 % (ref 36.0–46.0)
HEMOGLOBIN: 12.7 g/dL (ref 12.0–15.0)
LYMPHS ABS: 1 10*3/uL (ref 0.7–4.0)
Lymphocytes Relative: 18 % (ref 12.0–46.0)
MCHC: 34.4 g/dL (ref 30.0–36.0)
MCV: 89.4 fl (ref 78.0–100.0)
MONOS PCT: 5.4 % (ref 3.0–12.0)
Monocytes Absolute: 0.3 10*3/uL (ref 0.1–1.0)
Neutro Abs: 3.9 10*3/uL (ref 1.4–7.7)
Neutrophils Relative %: 73.1 % (ref 43.0–77.0)
Platelets: 292 10*3/uL (ref 150.0–400.0)
RBC: 4.13 Mil/uL (ref 3.87–5.11)
RDW: 12.9 % (ref 11.5–15.5)
WBC: 5.3 10*3/uL (ref 4.0–10.5)

## 2015-08-28 LAB — HEPATIC FUNCTION PANEL
ALBUMIN: 4.4 g/dL (ref 3.5–5.2)
ALK PHOS: 83 U/L (ref 39–117)
ALT: 14 U/L (ref 0–35)
AST: 16 U/L (ref 0–37)
Bilirubin, Direct: 0.1 mg/dL (ref 0.0–0.3)
TOTAL PROTEIN: 7.1 g/dL (ref 6.0–8.3)
Total Bilirubin: 0.4 mg/dL (ref 0.2–1.2)

## 2015-08-28 LAB — VITAMIN D 25 HYDROXY (VIT D DEFICIENCY, FRACTURES): VITD: 38.77 ng/mL (ref 30.00–100.00)

## 2015-08-28 LAB — TSH: TSH: 0.91 u[IU]/mL (ref 0.35–4.50)

## 2015-08-28 LAB — BASIC METABOLIC PANEL
BUN: 13 mg/dL (ref 6–23)
CALCIUM: 9.5 mg/dL (ref 8.4–10.5)
CO2: 29 mEq/L (ref 19–32)
CREATININE: 0.8 mg/dL (ref 0.40–1.20)
Chloride: 104 mEq/L (ref 96–112)
GFR: 76.44 mL/min (ref >60.00)
Glucose, Bld: 87 mg/dL (ref 70–99)
Potassium: 4.5 mEq/L (ref 3.5–5.1)
Sodium: 140 mEq/L (ref 135–145)

## 2015-08-28 LAB — CALCIUM: Calcium: 9.5 mg/dL (ref 8.4–10.5)

## 2015-08-29 LAB — HEPATITIS C ANTIBODY: HCV Ab: NEGATIVE

## 2015-08-29 LAB — CALCIUM, IONIZED: CALCIUM ION: 1.21 mmol/L (ref 1.12–1.32)

## 2015-08-30 ENCOUNTER — Encounter: Payer: Self-pay | Admitting: Internal Medicine

## 2015-08-31 DIAGNOSIS — R0989 Other specified symptoms and signs involving the circulatory and respiratory systems: Secondary | ICD-10-CM | POA: Diagnosis not present

## 2015-09-01 ENCOUNTER — Other Ambulatory Visit: Payer: Self-pay | Admitting: Otolaryngology

## 2015-09-01 DIAGNOSIS — H6983 Other specified disorders of Eustachian tube, bilateral: Secondary | ICD-10-CM | POA: Diagnosis not present

## 2015-09-01 DIAGNOSIS — H93299 Other abnormal auditory perceptions, unspecified ear: Secondary | ICD-10-CM | POA: Diagnosis not present

## 2015-09-01 DIAGNOSIS — H6123 Impacted cerumen, bilateral: Secondary | ICD-10-CM | POA: Diagnosis not present

## 2015-09-01 DIAGNOSIS — H9311 Tinnitus, right ear: Secondary | ICD-10-CM

## 2015-09-01 DIAGNOSIS — H93A1 Pulsatile tinnitus, right ear: Secondary | ICD-10-CM | POA: Diagnosis not present

## 2015-09-03 ENCOUNTER — Ambulatory Visit
Admission: RE | Admit: 2015-09-03 | Discharge: 2015-09-03 | Disposition: A | Discharge status: Home / Self Care | Payer: Medicare Other | Source: Ambulatory Visit | Attending: Internal Medicine | Admitting: Internal Medicine

## 2015-09-03 ENCOUNTER — Other Ambulatory Visit: Payer: Self-pay | Admitting: Internal Medicine

## 2015-09-03 DIAGNOSIS — Z1239 Encounter for other screening for malignant neoplasm of breast: Secondary | ICD-10-CM

## 2015-09-03 DIAGNOSIS — R928 Other abnormal and inconclusive findings on diagnostic imaging of breast: Secondary | ICD-10-CM

## 2015-09-03 DIAGNOSIS — R599 Enlarged lymph nodes, unspecified: Secondary | ICD-10-CM | POA: Insufficient documentation

## 2015-09-07 ENCOUNTER — Encounter: Payer: Self-pay | Admitting: Internal Medicine

## 2015-09-07 NOTE — Telephone Encounter (Signed)
Ok.  Hold until receive.   

## 2015-09-07 NOTE — Telephone Encounter (Signed)
Please call [facility] and see if I can get a copy of the results.  Also, let pt know that we are trying to get the results.  Have not seen.

## 2015-09-10 DIAGNOSIS — M0609 Rheumatoid arthritis without rheumatoid factor, multiple sites: Secondary | ICD-10-CM | POA: Diagnosis not present

## 2015-09-10 DIAGNOSIS — M150 Primary generalized (osteo)arthritis: Secondary | ICD-10-CM | POA: Diagnosis not present

## 2015-09-10 DIAGNOSIS — M7989 Other specified soft tissue disorders: Secondary | ICD-10-CM | POA: Diagnosis not present

## 2015-09-10 DIAGNOSIS — Z79899 Other long term (current) drug therapy: Secondary | ICD-10-CM | POA: Diagnosis not present

## 2015-09-11 NOTE — Telephone Encounter (Signed)
Please hold until we receive.  

## 2015-09-11 NOTE — Telephone Encounter (Signed)
I need the results of this test.  Can you call [facility] and have them fax over.

## 2015-09-11 NOTE — Telephone Encounter (Signed)
Results received & placed in yellow folder

## 2015-09-14 DIAGNOSIS — M1990 Unspecified osteoarthritis, unspecified site: Secondary | ICD-10-CM | POA: Diagnosis not present

## 2015-09-14 DIAGNOSIS — I7300 Raynaud's syndrome without gangrene: Secondary | ICD-10-CM | POA: Diagnosis not present

## 2015-09-14 DIAGNOSIS — R0989 Other specified symptoms and signs involving the circulatory and respiratory systems: Secondary | ICD-10-CM | POA: Diagnosis not present

## 2015-09-14 DIAGNOSIS — I10 Essential (primary) hypertension: Secondary | ICD-10-CM | POA: Diagnosis not present

## 2015-09-16 ENCOUNTER — Ambulatory Visit (INDEPENDENT_AMBULATORY_CARE_PROVIDER_SITE_OTHER): Payer: Medicare Other | Admitting: Nurse Practitioner

## 2015-09-16 ENCOUNTER — Encounter: Payer: Self-pay | Admitting: Nurse Practitioner

## 2015-09-16 VITALS — BP 132/86 | HR 70 | Temp 98.4°F | Ht 65.0 in | Wt 131.4 lb

## 2015-09-16 DIAGNOSIS — R300 Dysuria: Secondary | ICD-10-CM | POA: Diagnosis not present

## 2015-09-16 LAB — POCT URINALYSIS DIPSTICK
Bilirubin, UA: NEGATIVE
Glucose, UA: NEGATIVE
KETONES UA: NEGATIVE
Nitrite, UA: NEGATIVE
PH UA: 6
SPEC GRAV UA: 1.015
UROBILINOGEN UA: 0.2

## 2015-09-16 MED ORDER — NITROFURANTOIN MONOHYD MACRO 100 MG PO CAPS: 100.0000 mg | ORAL_CAPSULE | Freq: Two times a day (BID) | ORAL | Status: DC

## 2015-09-16 NOTE — Progress Notes (Signed)
Patient ID: Victoria Moss, female    DOB: Jun 14, 1950  Age: 65 y.o. MRN: [id number]  CC: Dysuria   HPI Victoria Moss presents for CC of UTI symptoms x 2 days.   1) Worsening over the last 2 days Hematuria- slight discoloration in urine Chills for 1 week Dysuria and frequency Denies urgency Lower abdominal/pelvic pressure   Treatment to date:  Water, cranberry supplements   History Victoria Moss has a past medical history of Osteoporosis; Arthritis; GERD (gastroesophageal reflux disease); Heart murmur; Migraine; Allergy; Diverticulosis; Rheumatic fever; Basal cell cancer; IBS (irritable bowel syndrome); and Status post dilation of esophageal narrowing.   She has past surgical history that includes Umbilical hernia repair; Tubal ligation; Dilation and curettage of uterus; Appendectomy (1982); Cesarean section; Toe Surgery (Left); Hernia repair; Dilation and curettage, diagnostic / therapeutic; Skin cancer excision; fatty tumor; and Breast biopsy (Right, 05/16).   Her family history includes Arthritis in her mother; Breast cancer (age of onset: 75) in her mother; Colon polyps in her mother; Hyperlipidemia in her father; Hypertension in her father and mother; Stroke in her mother. There is no history of Diabetes, Colon cancer, Esophageal cancer, Kidney disease, or Gallbladder disease.She reports that she quit smoking about 42 years ago. She has never used smokeless tobacco. She reports that she does not drink alcohol or use illicit drugs.  Outpatient Prescriptions Prior to Visit  Medication Sig Dispense Refill  . Calcium Carbonate (CALTRATE 600 PO) Take 1 tablet by mouth daily.     Marland Kitchen dicyclomine (BENTYL) 10 MG capsule One capsule q day prn 90 capsule 1  . IBUPROFEN PO Take by mouth as needed.     . Multiple Vitamins-Minerals (ALIVE WOMENS 50+ PO) Take 1 tablet by mouth daily.     . Omega-3 Fatty Acids (FISH OIL PO) Take 1 tablet by mouth daily.     Marland Kitchen omeprazole (PRILOSEC) 20 MG  capsule Take 1 capsule (20 mg total) by mouth daily. 90 capsule 1  . Vitamin D, Cholecalciferol, 400 UNITS CAPS Take 1 tablet by mouth daily.     Marland Kitchen BIOTIN PO Take 1 tablet by mouth daily.     . meloxicam (MOBIC) 15 MG tablet Take 15 mg by mouth daily. Reported on 08/06/2015     No facility-administered medications prior to visit.    ROS Review of Systems  Constitutional: Negative for fever, chills, diaphoresis and fatigue.  Genitourinary: Positive for dysuria, frequency and hematuria. Negative for urgency, flank pain, decreased urine volume, difficulty urinating and pelvic pain.  Skin: Negative for rash.    Objective:  BP 132/86 mmHg  Pulse 70  Temp(Src) 98.4 F (36.9 C) (Oral)  Ht 5\' 5"  (1.651 m)  Wt 131 lb 6 oz (59.591 kg)  BMI 21.86 kg/m2  SpO2 99%  Physical Exam  Constitutional: She is oriented to person, place, and time. She appears well-developed and well-nourished. No distress.  HENT:  Head: Normocephalic and atraumatic.  Right Ear: External ear normal.  Left Ear: External ear normal.  Abdominal: There is no CVA tenderness.  Neurological: She is alert and oriented to person, place, and time.  Skin: Skin is warm and dry. No rash noted. She is not diaphoretic.  Psychiatric: She has a normal mood and affect. Her behavior is normal. Judgment and thought content normal.   Assessment & Plan:   Victoria Moss was seen today for dysuria.  Diagnoses and all orders for this visit:  Dysuria -     POCT Urinalysis Dipstick -  Urine culture  Other orders -     nitrofurantoin, macrocrystal-monohydrate, (MACROBID) 100 MG capsule; Take 1 capsule (100 mg total) by mouth 2 (two) times daily.   I have discontinued Victoria Moss's BIOTIN PO and meloxicam. I am also having her start on nitrofurantoin (macrocrystal-monohydrate). Additionally, I am having her maintain her Multiple Vitamins-Minerals (ALIVE WOMENS 50+ PO), Calcium Carbonate (CALTRATE 600 PO), IBUPROFEN PO, Omega-3 Fatty  Acids (FISH OIL PO), Vitamin D (Cholecalciferol), omeprazole, and dicyclomine.  Meds ordered this encounter  Medications  . nitrofurantoin, macrocrystal-monohydrate, (MACROBID) 100 MG capsule    Sig: Take 1 capsule (100 mg total) by mouth 2 (two) times daily.    Dispense:  14 capsule    Refill:  0    Order Specific Question:  Supervising Provider    Answer:  Crecencio Mc [[id number]     Follow-up: Return if symptoms worsen or fail to improve.

## 2015-09-16 NOTE — Progress Notes (Signed)
Pre visit review using our clinic review tool, if applicable. No additional management support is needed unless otherwise documented below in the visit note. 

## 2015-09-16 NOTE — Patient Instructions (Addendum)
Please take a probiotic ( Align, Floraque or Culturelle) while you are on the antibiotic to prevent a serious antibiotic associated diarrhea  Called clostirudium dificile colitis and a vaginal yeast infection. Generics are great and take for 3 weeks.   Azo- helps with the burning sensation.   Urinary Tract Infection Urinary tract infections (UTIs) can develop anywhere along your urinary tract. Your urinary tract is your body's drainage system for removing wastes and extra water. Your urinary tract includes two kidneys, two ureters, a bladder, and a urethra. Your kidneys are a pair of bean-shaped organs. Each kidney is about the size of your fist. They are located below your ribs, one on each side of your spine. CAUSES Infections are caused by microbes, which are microscopic organisms, including fungi, viruses, and bacteria. These organisms are so small that they can only be seen through a microscope. Bacteria are the microbes that most commonly cause UTIs. SYMPTOMS  Symptoms of UTIs may vary by age and gender of the patient and by the location of the infection. Symptoms in young women typically include a frequent and intense urge to urinate and a painful, burning feeling in the bladder or urethra during urination. Older women and men are more likely to be tired, shaky, and weak and have muscle aches and abdominal pain. A fever may mean the infection is in your kidneys. Other symptoms of a kidney infection include pain in your back or sides below the ribs, nausea, and vomiting. DIAGNOSIS To diagnose a UTI, your caregiver will ask you about your symptoms. Your caregiver will also ask you to provide a urine sample. The urine sample will be tested for bacteria and white blood cells. White blood cells are made by your body to help fight infection. TREATMENT  Typically, UTIs can be treated with medication. Because most UTIs are caused by a bacterial infection, they usually can be treated with the use of  antibiotics. The choice of antibiotic and length of treatment depend on your symptoms and the type of bacteria causing your infection. HOME CARE INSTRUCTIONS  If you were prescribed antibiotics, take them exactly as your caregiver instructs you. Finish the medication even if you feel better after you have only taken some of the medication.  Drink enough water and fluids to keep your urine clear or pale yellow.  Avoid caffeine, tea, and carbonated beverages. They tend to irritate your bladder.  Empty your bladder often. Avoid holding urine for long periods of time.  Empty your bladder before and after sexual intercourse.  After a bowel movement, women should cleanse from front to back. Use each tissue only once. SEEK MEDICAL CARE IF:   You have back pain.  You develop a fever.  Your symptoms do not begin to resolve within 3 days. SEEK IMMEDIATE MEDICAL CARE IF:   You have severe back pain or lower abdominal pain.  You develop chills.  You have nausea or vomiting.  You have continued burning or discomfort with urination. MAKE SURE YOU:   Understand these instructions.  Will watch your condition.  Will get help right away if you are not doing well or get worse.   This information is not intended to replace advice given to you by your health care provider. Make sure you discuss any questions you have with your health care provider.   Document Released: 02/02/2005 Document Revised: 01/14/2015 Document Reviewed: 06/03/2011 Elsevier Interactive Patient Education [employer].

## 2015-09-16 NOTE — Assessment & Plan Note (Signed)
POCT urine probable for UTI. Will obtain urine culture. Treating empirically macrobid 100 mg twice daily for 7 days. Encouraged probiotics. FU prn worsening/failure to improve.

## 2015-09-17 ENCOUNTER — Ambulatory Visit: Payer: BLUE CROSS/BLUE SHIELD

## 2015-09-19 LAB — URINE CULTURE

## 2015-09-21 ENCOUNTER — Encounter: Payer: Self-pay | Admitting: Nurse Practitioner

## 2015-09-25 ENCOUNTER — Encounter: Payer: Self-pay | Admitting: Family Medicine

## 2015-09-25 ENCOUNTER — Ambulatory Visit (INDEPENDENT_AMBULATORY_CARE_PROVIDER_SITE_OTHER): Payer: Medicare Other | Admitting: Family Medicine

## 2015-09-25 VITALS — BP 146/86 | HR 67 | Temp 98.3°F | Ht 65.0 in | Wt 133.5 lb

## 2015-09-25 DIAGNOSIS — R300 Dysuria: Secondary | ICD-10-CM | POA: Diagnosis not present

## 2015-09-25 DIAGNOSIS — R399 Unspecified symptoms and signs involving the genitourinary system: Secondary | ICD-10-CM | POA: Diagnosis not present

## 2015-09-25 LAB — POCT URINALYSIS DIPSTICK
BILIRUBIN UA: NEGATIVE
GLUCOSE UA: NEGATIVE
Ketones, UA: NEGATIVE
LEUKOCYTES UA: NEGATIVE
NITRITE UA: NEGATIVE
Protein, UA: NEGATIVE
RBC UA: NEGATIVE
Spec Grav, UA: <=1.005
Urobilinogen, UA: NEGATIVE
pH, UA: 6

## 2015-09-25 NOTE — Progress Notes (Signed)
Pre visit review using our clinic review tool, if applicable. No additional management support is needed unless otherwise documented below in the visit note. 

## 2015-09-26 DIAGNOSIS — R300 Dysuria: Secondary | ICD-10-CM | POA: Insufficient documentation

## 2015-09-26 NOTE — Assessment & Plan Note (Signed)
Recent diagnosis UTI. She was treated appropriate. She complains of dysuria. Urinalysis is normal. We discussed that this is likely secondary to vaginal irritation from atrophic vaginitis. I also discussed treatment options. She has an upcoming appointment with GYN and defers examination and treatment until that time.

## 2015-09-26 NOTE — Progress Notes (Signed)
Subjective:  Patient ID: Victoria Moss, female    DOB: 1950/11/11  Age: 65 y.o. MRN: [id number]  CC: ? UTI  HPI:  65 year old female presents with concern for UTI.  Patient was recently seen on 5/10 and was diagnosed with UTI. It revealed Escherichia coli. She was treated with Macrobid (culture revealed sensitivity). She has completed antibiotic treatment. She continues to have some discomfort with urination. No urinary frequency or urgency. No known exacerbating or relieving factors. No fevers or chills. She does note some fatigue but she has some ongoing medical issues as well. No other reported symptoms. No other complaints at this time.  Social Hx   Social History   Social History  . Marital Status: Married    Spouse Name: N/A  . Number of Children: 2  . Years of Education: N/A   Occupational History  . Retired Other   Social History Main Topics  . Smoking status: Former Smoker -- 5 years    Quit date: 05/09/1973  . Smokeless tobacco: Never Used     Comment: Quit at age 68  . Alcohol Use: No  . Drug Use: No  . Sexual Activity: Not Asked   Other Topics Concern  . None   Social History Narrative   Review of Systems  Constitutional: Positive for fatigue.  Genitourinary: Positive for dysuria. Negative for urgency, frequency and flank pain.   Objective:  BP 146/86 mmHg  Pulse 67  Temp(Src) 98.3 F (36.8 C) (Oral)  Ht 5\' 5"  (1.651 m)  Wt 133 lb 8 oz (60.555 kg)  BMI 22.22 kg/m2  SpO2 98%  BP/Weight 09/25/2015 [id number]  Systolic BP 123456 [id number]  Diastolic BP 86 86 80  Wt. (Lbs) 133.5 131.38 132  BMI 22.22 21.86 21.97   Physical Exam  Constitutional: She is oriented to person, place, and time. She appears well-developed. No distress.  Pulmonary/Chest: Effort normal.  Genitourinary:  Declined exam as she has an upcoming appt with GYN.  Neurological: She is alert and oriented to person, place, and time.  Psychiatric: She has a normal mood  and affect.  Vitals reviewed.  Lab Results  Component Value Date   WBC 5.3 08/28/2015   HGB 12.7 08/28/2015   HCT 36.9 08/28/2015   PLT 292.0 08/28/2015   GLUCOSE 87 08/28/2015   CHOL 160 08/11/2014   TRIG 83.0 08/11/2014   HDL 59.40 08/11/2014   LDLCALC 84 08/11/2014   ALT 14 08/28/2015   AST 16 08/28/2015   NA 140 08/28/2015   K 4.5 08/28/2015   CL 104 08/28/2015   CREATININE 0.80 08/28/2015   BUN 13 08/28/2015   CO2 29 08/28/2015   TSH 0.91 08/28/2015   HGBA1C 5.6 12/31/2014   Results for orders placed or performed in visit on 09/25/15 (from the past 24 hour(s))  POCT Urinalysis Dipstick     Status: Normal   Collection Time: 09/25/15  4:08 PM  Result Value Ref Range   Color, UA clear    Clarity, UA clear    Glucose, UA neg    Bilirubin, UA neg    Ketones, UA neg    Spec Grav, UA <=1.005    Blood, UA neg    pH, UA 6.0    Protein, UA neg    Urobilinogen, UA negative    Nitrite, UA neg    Leukocytes, UA Negative Negative    Assessment & Plan:   Problem List Items Addressed This Visit    Dysuria -  Primary    Recent diagnosis UTI. She was treated appropriate. She complains of dysuria. Urinalysis is normal. We discussed that this is likely secondary to vaginal irritation from atrophic vaginitis. I also discussed treatment options. She has an upcoming appointment with GYN and defers examination and treatment until that time.      Relevant Orders   POCT Urinalysis Dipstick (Completed)     Follow-up: PRN  [place]

## 2015-09-27 LAB — URINE CULTURE
COLONY COUNT: NO GROWTH
Organism ID, Bacteria: NO GROWTH

## 2015-10-01 ENCOUNTER — Encounter: Payer: Self-pay | Admitting: Obstetrics and Gynecology

## 2015-10-01 ENCOUNTER — Ambulatory Visit (INDEPENDENT_AMBULATORY_CARE_PROVIDER_SITE_OTHER): Payer: Medicare Other | Admitting: Obstetrics and Gynecology

## 2015-10-01 VITALS — BP 130/70 | HR 84 | Resp 16 | Ht 65.75 in | Wt 134.0 lb

## 2015-10-01 DIAGNOSIS — Z0000 Encounter for general adult medical examination without abnormal findings: Secondary | ICD-10-CM

## 2015-10-01 DIAGNOSIS — N3281 Overactive bladder: Secondary | ICD-10-CM | POA: Diagnosis not present

## 2015-10-01 DIAGNOSIS — Z01419 Encounter for gynecological examination (general) (routine) without abnormal findings: Secondary | ICD-10-CM

## 2015-10-01 DIAGNOSIS — N952 Postmenopausal atrophic vaginitis: Secondary | ICD-10-CM | POA: Diagnosis not present

## 2015-10-01 DIAGNOSIS — N760 Acute vaginitis: Secondary | ICD-10-CM | POA: Diagnosis not present

## 2015-10-01 DIAGNOSIS — N762 Acute vulvitis: Secondary | ICD-10-CM

## 2015-10-01 DIAGNOSIS — N905 Atrophy of vulva: Secondary | ICD-10-CM | POA: Diagnosis not present

## 2015-10-01 LAB — POCT URINALYSIS DIPSTICK
BILIRUBIN UA: NEGATIVE
GLUCOSE UA: NEGATIVE
Ketones, UA: NEGATIVE
Leukocytes, UA: NEGATIVE
Nitrite, UA: NEGATIVE
Protein, UA: NEGATIVE
RBC UA: NEGATIVE
UROBILINOGEN UA: NEGATIVE
pH, UA: 6.5

## 2015-10-01 MED ORDER — BETAMETHASONE VALERATE 0.1 % EX OINT: TOPICAL_OINTMENT | CUTANEOUS | Status: DC

## 2015-10-01 NOTE — Progress Notes (Signed)
Patient ID: Victoria Moss, female   DOB: [birth year], 65 y.o.   MRN: [id number] 65 y.o. [id number] Widowed CaucasianF here for annual exam.  She had a UTI 2 weeks ago, treated. Long term issues with frequent urination, some urgency to void, some pressure. Voids normal amounts, nocturia 3-4 x a night. She drinks 2 cups of coffee every morning. No dysuria. She has some vulvar burning off and on in the last 6 months. Currently dealing with a respiratory infection. She has arthritis, IBS and GERD. Just doesn't feel well and the causes haven't be diagnosed.  She wants to make sure she doesn't have a vaginal infection. Not itching, just burning. No vaginal discharge. She isn't sexually active, widow.  She tried the estring in the past, but didn't like it.  She still gets hot flashes, occasional chills and nausea, no night sweats. Hot flashes are a little better.  No change of BM.     No LMP recorded. Patient is postmenopausal.          Sexually active: No.  The current method of family planning is post menopausal status.    Exercising: Yes.    walking/ strength training/ yoga Smoker:  no  Health Maintenance: Pap:  08-07-14 WNL NEG HR HPV History of abnormal Pap:  no MMG:  09-03-15 WNL Colonoscopy:  05-27-09 diverticulosis  BMD:   10-25-13 Osteopenia, has another one scheduled next month.  TDaP:  05-11-11  Gardasil: N/A   reports that she quit smoking about 42 years ago. She has never used smokeless tobacco. She reports that she does not drink alcohol or use illicit drugs.Retired, taught 3 rd grade for 32 years. 2 daughters both in [place], no grandchildren. Her parents are in [place] in independent living, she helps take care of them.   Past Medical History  Diagnosis Date  . Osteoporosis   . Arthritis   . GERD (gastroesophageal reflux disease)   . Heart murmur   . Migraine     H/O, none since stopping chocolate  . Allergy   . Diverticulosis     H/O  . Rheumatic fever     H/O  . Basal cell  cancer   . IBS (irritable bowel syndrome)   . Status post dilation of esophageal narrowing     Past Surgical History  Procedure Laterality Date  . Umbilical hernia repair    . Tubal ligation    . Dilation and curettage of uterus      x2  . Appendectomy  1982  . Cesarean section    . Toe surgery Left     joint replacement bit toe  . Hernia repair    . Dilation and curettage, diagnostic / therapeutic      x 2  . Skin cancer excision      [facility]   . Fatty tumor      on back  . Breast biopsy Right 05/16    axillary node  . Parathyroidectomy      Current Outpatient Prescriptions  Medication Sig Dispense Refill  . aspirin 81 MG tablet Take 81 mg by mouth daily.    . Calcium Carbonate (CALTRATE 600 PO) Take 1 tablet by mouth daily.     Marland Kitchen dicyclomine (BENTYL) 10 MG capsule One capsule q day prn 90 capsule 1  . IBUPROFEN PO Take by mouth as needed. Reported on 09/25/2015    . Multiple Vitamins-Minerals (ALIVE WOMENS 50+ PO) Take 1 tablet by mouth daily.     Marland Kitchen  Omega-3 Fatty Acids (FISH OIL PO) Take 1 tablet by mouth daily.     Marland Kitchen omeprazole (PRILOSEC) 20 MG capsule Take 1 capsule (20 mg total) by mouth daily. 90 capsule 1  . Vitamin D, Cholecalciferol, 400 UNITS CAPS Take 1 tablet by mouth daily.     . betamethasone valerate ointment (VALISONE) 0.1 % Apply a pea sized amount topically BID x 1-2 weeks 15 g 0   No current facility-administered medications for this visit.    Family History  Problem Relation Age of Onset  . Diabetes Neg Hx   . Breast cancer Mother 74  . Hypertension Mother   . Stroke Mother     Late 83  . Arthritis Mother   . Hypertension Father   . Hyperlipidemia Father   . Colon cancer Neg Hx   . Colon polyps Mother   . Esophageal cancer Neg Hx   . Kidney disease Neg Hx   . Gallbladder disease Neg Hx     Review of Systems  Constitutional: Negative.   HENT: Negative.   Eyes: Negative.   Respiratory: Negative.   Cardiovascular: Negative.    Gastrointestinal: Positive for nausea.       Bloating  Endocrine: Negative.   Genitourinary: Positive for dysuria, urgency and frequency.  Musculoskeletal: Negative.   Skin: Negative.   Allergic/Immunologic: Negative.   Neurological: Negative.   Psychiatric/Behavioral: Negative.     Exam:   BP 130/70 mmHg  Pulse 84  Resp 16  Ht 5' 5.75" (1.67 m)  Wt 134 lb (60.782 kg)  BMI 21.79 kg/m2  Weight change: @WEIGHTCHANGE @ Height:   Height: 5' 5.75" (167 cm)  Ht Readings from Last 3 Encounters:  10/01/15 5' 5.75" (1.67 m)  09/25/15 5\' 5"  (1.651 m)  09/16/15 5\' 5"  (1.651 m)    General appearance: alert, cooperative and appears stated age Head: Normocephalic, without obvious abnormality, atraumatic Neck: no adenopathy, supple, symmetrical, trachea midline and thyroid normal to inspection and palpation Lungs: clear to auscultation bilaterally Breasts: normal appearance, no masses or tenderness Heart: regular rate and rhythm Abdomen: soft, non-tender; bowel sounds normal; no masses,  no organomegaly Extremities: extremities normal, atraumatic, no cyanosis or edema Skin: Skin color, texture, turgor normal. No rashes or lesions Lymph nodes: Cervical, supraclavicular, and axillary nodes normal. No abnormal inguinal nodes palpated Neurologic: Grossly normal   Pelvic: External genitalia:  no lesions, atrophic, no lesions, whitening, fissures or plaques              Urethra:  normal appearing urethra with no masses, tenderness or lesions              Bartholins and Skenes: normal                 Vagina: atrophic vaginal mucosa, no abnormal discharge, no lesions              Cervix: no lesions               Bimanual Exam:  Uterus:  normal size, contour, position, consistency, mobility, non-tender              Adnexa: no mass, fullness, tenderness               Rectovaginal: Confirms               Anus:  normal sphincter tone, no lesions  Chaperone was present for exam.  A:  Well  Woman with normal exam  vulvovaginal atrophy  Vulvitis  Urinary  frequency, urgency, nocturia. All with normal amounts of urine. We discussed that the caffeine could be causing at least some of her symptoms. She had a negative urine culture last week  P:   No pap this year  Wet prep probe  Urine dip is normal, negative urine culture last week  Will treat vulvar irritation with steroid ointment, if symptoms persist will try vaginal estrogen cream  Cut out caffeine, if urinary symptoms persist, consider medication or PT

## 2015-10-01 NOTE — Patient Instructions (Signed)

## 2015-10-02 LAB — WET PREP BY MOLECULAR PROBE
CANDIDA SPECIES: NEGATIVE
Gardnerella vaginalis: NEGATIVE
Trichomonas vaginosis: NEGATIVE

## 2015-10-28 DIAGNOSIS — E210 Primary hyperparathyroidism: Secondary | ICD-10-CM | POA: Diagnosis not present

## 2015-11-11 ENCOUNTER — Encounter: Payer: Self-pay | Admitting: Internal Medicine

## 2015-11-11 DIAGNOSIS — E559 Vitamin D deficiency, unspecified: Secondary | ICD-10-CM | POA: Diagnosis not present

## 2015-11-11 DIAGNOSIS — E892 Postprocedural hypoparathyroidism: Secondary | ICD-10-CM | POA: Diagnosis not present

## 2015-11-11 DIAGNOSIS — M810 Age-related osteoporosis without current pathological fracture: Secondary | ICD-10-CM | POA: Diagnosis not present

## 2015-11-11 DIAGNOSIS — Z8639 Personal history of other endocrine, nutritional and metabolic disease: Secondary | ICD-10-CM | POA: Diagnosis not present

## 2015-11-13 ENCOUNTER — Ambulatory Visit (INDEPENDENT_AMBULATORY_CARE_PROVIDER_SITE_OTHER): Payer: Medicare Other | Admitting: Family

## 2015-11-13 VITALS — BP 126/80 | HR 59 | Temp 97.8°F | Wt 136.0 lb

## 2015-11-13 DIAGNOSIS — R350 Frequency of micturition: Secondary | ICD-10-CM

## 2015-11-13 DIAGNOSIS — M5442 Lumbago with sciatica, left side: Secondary | ICD-10-CM | POA: Diagnosis not present

## 2015-11-13 LAB — POC URINALSYSI DIPSTICK (AUTOMATED)
LEUKOCYTES UA: NEGATIVE
PH UA: 7
Spec Grav, UA: 1.01
UROBILINOGEN UA: NEGATIVE

## 2015-11-13 NOTE — Patient Instructions (Signed)
Referral to urology.  For low back pain which I suspect is related to SI joint. Let's treat conservatively as we discussed.   Over-the-counter medications you may try for arthritic pain include:   ThermaCare patches   Capsaicin cream   Icy hot  Home exercises as we discussed below/handout.  If conservative treatment doesn't yield results, we will consider physical therapy, consult to Sports Medicine/Orthopedics for further evaluation, and imaging.   If there is no improvement in your symptoms, or if there is any worsening of symptoms, or if you have any additional concerns, please return for re-evaluation; or, if we are closed, consider going to the Emergency Room for evaluation if symptoms urgent.  Sacroiliac Joint Dysfunction Sacroiliac joint dysfunction is a condition that causes inflammation on one or both sides of the sacroiliac (SI) joint. The SI joint connects the lower part of the spine (sacrum) with the two upper portions of the pelvis (ilium). This condition causes deep aching or burning pain in the low back. In some cases, the pain may also spread into one or both buttocks or hips or spread down the legs. CAUSES This condition may be caused by:  Pregnancy. During pregnancy, extra stress is put on the SI joints because the pelvis widens.  Injury, such as:  Car accidents.  Sport-related injuries.  Work-related injuries.  Having one leg that is shorter than the other.  Conditions that affect the joints, such as:  Rheumatoid arthritis.  Gout.  Psoriatic arthritis.  Joint infection (septic arthritis). Sometimes, the cause of SI joint dysfunction is not known. SYMPTOMS Symptoms of this condition include:  Aching or burning pain in the lower back. The pain may also spread to other areas, such as:  Buttocks.  Groin.  Thighs and legs.  Muscle spasms in or around the painful areas.  Increased pain when standing, walking, running, stair climbing, bending, or  lifting. DIAGNOSIS Your health care provider will do a physical exam and take your medical history. During the exam, the health care provider may move one or both of your legs to different positions to check for pain. Various tests may be done to help verify the diagnosis, including:  Imaging tests to look for other causes of pain. These may include:  MRI.  CT scan.  Bone scan.  Diagnostic injection. A numbing medicine is injected into the SI joint using a needle. If the pain is temporarily improved or stopped after the injection, this can indicate that SI joint dysfunction is the problem. TREATMENT Treatment may vary depending on the cause and severity of your condition. Treatment options may include:  Applying ice or heat to the lower back area. This can help to reduce pain and muscle spasms.  Medicines to relieve pain or inflammation or to relax the muscles.  Wearing a back brace (sacroiliac brace) to help support the joint while your back is healing.  Physical therapy to increase muscle strength around the joint and flexibility at the joint. This may also involve learning proper body positions and ways of moving to relieve stress on the joint.  Direct manipulation of the SI joint.  Injections of steroid medicine into the joint in order to reduce pain and swelling.  Radiofrequency ablation to burn away nerves that are carrying pain messages from the joint.  Use of a device that provides electrical stimulation in order to reduce pain at the joint.  Surgery to put in screws and plates that limit or prevent joint motion. This is rare. HOME  CARE INSTRUCTIONS  Rest as needed. Limit your activities as directed by your health care provider.  Take medicines only as directed by your health care provider.  If directed, apply ice to the affected area:  Put ice in a plastic bag.  Place a towel between your skin and the bag.  Leave the ice on for 20 minutes, 2-3 times per  day.  Use a heating pad or a moist heat pack as directed by your health care provider.  Exercise as directed by your health care provider or physical therapist.  Keep all follow-up visits as directed by your health care provider. This is important. SEEK MEDICAL CARE IF:  Your pain is not controlled with medicine.  You have a fever.  You have increasingly severe pain. SEEK IMMEDIATE MEDICAL CARE IF:  You have weakness, numbness, or tingling in your legs or feet.  You lose control of your bladder or bowel.   This information is not intended to replace advice given to you by your health care provider. Make sure you discuss any questions you have with your health care provider.   Document Released: 07/22/2008 Document Revised: 09/09/2014 Document Reviewed: 12/31/2013 Elsevier Interactive Patient Education [employer].

## 2015-11-13 NOTE — Progress Notes (Signed)
Subjective:    Patient ID: Victoria Moss, female    DOB: [birth year], 65 y.o.   MRN: [id number]   Victoria Moss is a 65 y.o. female who presents today for an acute visit.    HPI Comments: Patient here for revaluation of urinary frequency. She states it has been chronic as an adult however worsened over the past couple of months. No dysuria or incontinence. Concern that she is not empyting bladder. Describes a feeling of fullness. No h/o GYN or bladder cancer. No concern for STDs. No fever,chills, hematuria, abdominal pain. Normal BMs.    She also complains of low back pain for 3-4 days on right side which is new. Ache. She has had numbness and tingling bilateral buttucks and radiates to thighs for several months, worsening over several weeks. Tingling and numbness worse when she lays down. Pain improves with advil, tyelonol. No injury. Doesn't 'do well on steroids.' No history of cancer. No LE weakness, falls, saddle anesthesia, or urinary or bowel incontinence.   Was seen by GYN as thought that vaginal atrophy may be contributory. GYN started vaginal steriod cream for vulvar irritation. She has used a couple of times however not seeing a big difference.  Past Medical History  Diagnosis Date  . Osteoporosis   . Arthritis   . GERD (gastroesophageal reflux disease)   . Heart murmur   . Migraine     H/O, none since stopping chocolate  . Allergy   . Diverticulosis     H/O  . Rheumatic fever     H/O  . Basal cell cancer   . IBS (irritable bowel syndrome)   . Status post dilation of esophageal narrowing    Allergies: Codeine and Penicillins Current Outpatient Prescriptions on File Prior to Visit  Medication Sig Dispense Refill  . aspirin 81 MG tablet Take 81 mg by mouth daily.    . betamethasone valerate ointment (VALISONE) 0.1 % Apply a pea sized amount topically BID x 1-2 weeks 15 g 0  . Calcium Carbonate (CALTRATE 600 PO) Take 1 tablet by mouth daily.     Marland Kitchen  dicyclomine (BENTYL) 10 MG capsule One capsule q day prn 90 capsule 1  . IBUPROFEN PO Take by mouth as needed. Reported on 09/25/2015    . Multiple Vitamins-Minerals (ALIVE WOMENS 50+ PO) Take 1 tablet by mouth daily.     . Omega-3 Fatty Acids (FISH OIL PO) Take 1 tablet by mouth daily.     Marland Kitchen omeprazole (PRILOSEC) 20 MG capsule Take 1 capsule (20 mg total) by mouth daily. 90 capsule 1  . Vitamin D, Cholecalciferol, 400 UNITS CAPS Take 1 tablet by mouth daily.      No current facility-administered medications on file prior to visit.    Social History  Substance Use Topics  . Smoking status: Former Smoker -- 5 years    Quit date: 05/09/1973  . Smokeless tobacco: Never Used     Comment: Quit at age 43  . Alcohol Use: No    Review of Systems  Constitutional: Negative for fever and chills.  Respiratory: Negative for cough.   Cardiovascular: Negative for chest pain and palpitations.  Gastrointestinal: Negative for nausea, vomiting, abdominal pain, constipation and abdominal distention.  Genitourinary: Positive for urgency and frequency. Negative for dysuria, hematuria, flank pain, decreased urine volume, vaginal discharge and vaginal pain.  Musculoskeletal: Positive for back pain.  Neurological: Positive for numbness.      Objective:    BP 126/80 mmHg  Pulse 59  Temp(Src) 97.8 F (36.6 C)  Wt 136 lb (61.689 kg)  SpO2 99%   Physical Exam  Constitutional: She appears well-developed and well-nourished.  Eyes: Conjunctivae are normal.  Cardiovascular: Normal rate, regular rhythm, normal heart sounds and normal pulses.   Pulmonary/Chest: Effort normal and breath sounds normal. She has no wheezes. She has no rhonchi. She has no rales.  Abdominal: There is no CVA tenderness.  Musculoskeletal:       Lumbar back: She exhibits pain. She exhibits normal range of motion, no tenderness, no bony tenderness, no swelling, no edema and no spasm.  No pain, numbness, tingling elicited with  single leg raise bilaterally. Pain with palpation of SI joint.   Neurological: She is alert. She has normal strength. No sensory deficit.  Reflex Scores:      Patellar reflexes are 2+ on the right side and 2+ on the left side. Sensation and strength intact bilateral lower extremities.  Skin: Skin is warm and dry.  Psychiatric: She has a normal mood and affect. Her speech is normal and behavior is normal. Thought content normal.  Vitals reviewed.      Assessment & Plan:  1. Urinary frequency Urine dipstick negative for leukocytes, blood. I suspect that her urinary frequency may be related to inadequate bladder emptying and have placed a referral to urology for further evaluation. - Urine culture - POCT Urinalysis Dipstick (Automated) - Ambulatory referral to Urology  2. Left-sided low back pain with sciatica, sciatica laterality unspecified We jointly agreed on conservative management at this time. Patient has no red flag symptoms. She does not do well on oral steroids; we agreed to trial topical over-the-counter medication.Exercises given to patient.     I am having Ms. Kobashigawa maintain her Multiple Vitamins-Minerals (ALIVE WOMENS 50+ PO), Calcium Carbonate (CALTRATE 600 PO), IBUPROFEN PO, Omega-3 Fatty Acids (FISH OIL PO), Vitamin D (Cholecalciferol), omeprazole, dicyclomine, aspirin, and betamethasone valerate ointment.   No orders of the defined types were placed in this encounter.     Start medications as prescribed and explained to patient on After Visit Summary ( AVS). Risks, benefits, and alternatives of the medications and treatment plan prescribed today were discussed, and patient expressed understanding.   Education regarding symptom management and diagnosis given to patient.   Follow-up:Plan follow-up and return precautions given if any worsening symptoms or change in condition.   Continue to follow with Einar Pheasant, MD for routine health maintenance.   [name] and I agreed with plan.   Mable Paris, FNP

## 2015-11-15 LAB — URINE CULTURE
Colony Count: NO GROWTH
Organism ID, Bacteria: NO GROWTH

## 2015-11-23 ENCOUNTER — Ambulatory Visit (INDEPENDENT_AMBULATORY_CARE_PROVIDER_SITE_OTHER): Payer: Medicare Other | Admitting: Obstetrics and Gynecology

## 2015-11-23 ENCOUNTER — Encounter: Payer: Self-pay | Admitting: Obstetrics and Gynecology

## 2015-11-23 VITALS — BP 110/72 | HR 54 | Resp 12 | Ht 65.75 in | Wt 134.4 lb

## 2015-11-23 DIAGNOSIS — R102 Pelvic and perineal pain: Secondary | ICD-10-CM

## 2015-11-23 DIAGNOSIS — R1032 Left lower quadrant pain: Secondary | ICD-10-CM | POA: Diagnosis not present

## 2015-11-23 DIAGNOSIS — R5383 Other fatigue: Secondary | ICD-10-CM

## 2015-11-23 DIAGNOSIS — N760 Acute vaginitis: Secondary | ICD-10-CM

## 2015-11-23 DIAGNOSIS — N762 Acute vulvitis: Secondary | ICD-10-CM | POA: Diagnosis not present

## 2015-11-23 DIAGNOSIS — N3281 Overactive bladder: Secondary | ICD-10-CM | POA: Diagnosis not present

## 2015-11-23 DIAGNOSIS — N949 Unspecified condition associated with female genital organs and menstrual cycle: Secondary | ICD-10-CM

## 2015-11-23 MED ORDER — ESTRADIOL 10 MCG VA TABS
ORAL_TABLET | VAGINAL | Status: DC
Start: 2015-11-23 — End: 2016-10-20

## 2015-11-23 NOTE — Progress Notes (Signed)
GYNECOLOGY  VISIT   HPI: 65 y.o.   Widowed  Caucasian  female   [id number] with No LMP recorded. Patient is postmenopausal.   here for vaginal itching and fatigue. Vaginal itching has been going on for 5-6 weeks. Has used cream some, but doesn't know if helped any or not. Fatigue has been past 2-3 weeks. Patient states has more stress lately.   When she was seen for her annual exam in 5/17 she was c/o vulvar irritation. Negative vaginitis probe. She was treated with steroid ointment, no help. Also had atrophy.  She c/o ongoing issues with intermittent vaginal burning. Feels like she is on fire, can't get comfortable. She continues to have frequent urination, every 90-120 minutes, voids normal amounts. Not sexually active.  She feels tired all the time, intermittently aching.  She has intermittent pain in her LLQ for the last 2 months, seems unrelated to BM's. Normal BM qd. The pain is a dull ache, can last for 1-2 hours and then goes away. Up to a 5-6/10 in severity.  She tried the estring in the past, had terrible headaches on it.  She had blood work with her primary about 3 months ago, normal. Endocrinologist also did blood work this month, which was fine (including calcium and 24 hour urine). She has a [employer], has inflammatory arthritis. Has a high CRP.   GYNECOLOGIC HISTORY: No LMP recorded. Patient is postmenopausal. Contraception:Post-Menopause Menopausal hormone therapy: None        OB History    Gravida Para Term Preterm AB TAB SAB Ectopic Multiple Living   3 2 2  1     2       Obstetric Comments   1st Menstrual Cycle:  13 1st Pregnancy:  18         Patient Active Problem List   Diagnosis Date Noted  . Dysuria 09/26/2015  . Abnormal mammogram 02/09/2015  . Hypercalcemia 02/06/2015  . Carotid bruit 12/03/2014  . Insomnia 10/27/2014  . Health care maintenance 08/10/2014  . Left hip pain 11/13/2013  . Family history of colonic polyps 07/02/2013  . Arthritis 12/23/2012   . IBS (irritable bowel syndrome) 12/23/2012  . Diverticulosis 12/23/2012  . GERD (gastroesophageal reflux disease) 12/23/2012  . Environmental allergies 12/23/2012  . Migraine 12/23/2012  . Osteoporosis     Past Medical History  Diagnosis Date  . Osteoporosis   . Arthritis   . GERD (gastroesophageal reflux disease)   . Heart murmur   . Migraine     H/O, none since stopping chocolate  . Allergy   . Diverticulosis     H/O  . Rheumatic fever     H/O  . Basal cell cancer   . IBS (irritable bowel syndrome)   . Status post dilation of esophageal narrowing     Past Surgical History  Procedure Laterality Date  . Umbilical hernia repair    . Tubal ligation    . Dilation and curettage of uterus      x2  . Appendectomy  1982  . Cesarean section    . Toe surgery Left     joint replacement bit toe  . Hernia repair    . Dilation and curettage, diagnostic / therapeutic      x 2  . Skin cancer excision      [facility]   . Fatty tumor      on back  . Breast biopsy Right 05/16    axillary node  . Parathyroidectomy  Current Outpatient Prescriptions  Medication Sig Dispense Refill  . aspirin 81 MG tablet Take 81 mg by mouth daily.    . betamethasone valerate ointment (VALISONE) 0.1 % Apply a pea sized amount topically BID x 1-2 weeks 15 g 0  . Calcium Carbonate (CALTRATE 600 PO) Take 1 tablet by mouth daily.     Marland Kitchen dicyclomine (BENTYL) 10 MG capsule One capsule q day prn 90 capsule 1  . IBUPROFEN PO Take by mouth as needed. Reported on 09/25/2015    . Multiple Vitamins-Minerals (ALIVE WOMENS 50+ PO) Take 1 tablet by mouth daily.     . Omega-3 Fatty Acids (FISH OIL PO) Take 1 tablet by mouth daily.     Marland Kitchen omeprazole (PRILOSEC) 20 MG capsule Take 1 capsule (20 mg total) by mouth daily. 90 capsule 1  . Saccharomyces boulardii (PROBIOTIC) 250 MG CAPS Take by mouth daily.    . Vitamin D, Cholecalciferol, 400 UNITS CAPS Take 1 tablet by mouth daily.      No current  facility-administered medications for this visit.     ALLERGIES: Codeine and Penicillins  Family History  Problem Relation Age of Onset  . Diabetes Neg Hx   . Breast cancer Mother 64  . Hypertension Mother   . Stroke Mother     Late 90  . Arthritis Mother   . Hypertension Father   . Hyperlipidemia Father   . Colon cancer Neg Hx   . Colon polyps Mother   . Esophageal cancer Neg Hx   . Kidney disease Neg Hx   . Gallbladder disease Neg Hx     Social History   Social History  . Marital Status: Widowed    Spouse Name: N/A  . Number of Children: 2  . Years of Education: N/A   Occupational History  . Retired Other   Social History Main Topics  . Smoking status: Former Smoker -- 5 years    Quit date: 05/09/1973  . Smokeless tobacco: Never Used     Comment: Quit at age 78  . Alcohol Use: No  . Drug Use: No  . Sexual Activity: No   Other Topics Concern  . Not on file   Social History Narrative    ROS  PHYSICAL EXAMINATION:    BP 110/72 mmHg  Pulse 54  Resp 12  Ht 5' 5.75" (1.67 m)  Wt 134 lb 6.4 oz (60.963 kg)  BMI 21.86 kg/m2    General appearance: alert, cooperative and appears stated age Abdomen: soft, non-tender; bowel sounds normal; no masses,  no organomegaly, not distended CVA: not tender  Pelvic: External genitalia:  no lesions              Urethra:  normal appearing urethra with no masses, tenderness or lesions              Bartholins and Skenes: normal                 Vagina: atrophic appearing vagina, no discharge, no lesions              Cervix: no lesions              Bimanual Exam:  Uterus:  normal size, contour, position, consistency, mobility, non-tender              Adnexa: no mass, fullness, tenderness              Rectovaginal: Yes.  .  Confirms.  Anus:  normal sphincter tone, no lesions  Chaperone was present for exam.  ASSESSMENT Vaginal burning, significant atrophy Urinary frequency, urgency, just had a negative urine  last week LLQ abdominal pain Fatigue, achy, recent normal labs with her Primary and Endocrinologist. F/U with Rheumatologist    PLAN Start vagifem Send vaginitis probe She just had a negative urine culture, has an appointment with urology next month F/U for a gyn ultrasound  Will return for a vaginal ultrasound   An After Visit Summary was printed and given to the patient.

## 2015-11-24 ENCOUNTER — Telehealth: Payer: Self-pay | Admitting: Obstetrics and Gynecology

## 2015-11-24 LAB — WET PREP BY MOLECULAR PROBE
CANDIDA SPECIES: NEGATIVE
Gardnerella vaginalis: NEGATIVE
TRICHOMONAS VAG: NEGATIVE

## 2015-11-24 NOTE — Telephone Encounter (Signed)
Called patient to review benefits for a recommended procedure. Left Voicemail requesting a call back. °

## 2015-11-27 ENCOUNTER — Encounter: Payer: Self-pay | Admitting: Family Medicine

## 2015-11-27 ENCOUNTER — Telehealth: Payer: Self-pay

## 2015-11-27 ENCOUNTER — Ambulatory Visit (INDEPENDENT_AMBULATORY_CARE_PROVIDER_SITE_OTHER): Payer: Medicare Other | Admitting: Family Medicine

## 2015-11-27 ENCOUNTER — Encounter: Payer: Self-pay | Admitting: Obstetrics and Gynecology

## 2015-11-27 VITALS — BP 158/80 | HR 72 | Temp 98.0°F | Wt 132.5 lb

## 2015-11-27 DIAGNOSIS — M542 Cervicalgia: Secondary | ICD-10-CM

## 2015-11-27 MED ORDER — CYCLOBENZAPRINE HCL 5 MG PO TABS: 5.0000 mg | ORAL_TABLET | Freq: Three times a day (TID) | ORAL | Status: DC | PRN

## 2015-11-27 NOTE — Telephone Encounter (Signed)
Spoke with patient. Advised of message as seen below from [place]. She is agreeable and verbalizes understanding.  Cc: Dr.Jertson  Routing to provider for final review. Patient agreeable to disposition. Will close encounter.

## 2015-11-27 NOTE — Telephone Encounter (Signed)
Typically this takes several weeks for all of the symptoms to resolve, so she should keep using it.

## 2015-11-27 NOTE — Telephone Encounter (Signed)
Visit Follow-Up Question  Message [id number]   From  Babli Derring   To  Salvadore Dom, MD   Sent  11/27/2015 8:40 AM     I have been using the estrogen for 4 days and the burning doesn't seem to be improving. It seems to be a little worse. I just wondered if I should just give it more time or discontinue using it. I also have been having flu like symptoms- very achy and queasy with a lot of neck pain. I'm going to my GP today to see what that is.  Thank you for your time and patience,  Victoria Moss No one has taken responsibility for this message.     No actions have been taken on this message.     Patient was seen on 11/23/2015 with Dr.Jertson due to vaginal burning and itching. She was started on Vagifem 10 mcg pv nightly for 1 week then 2 tablets pv per week. Affirm testing was performed which was negative. Reports she still experiencing vaginal burning and irritation with use of Vagifem. Has used three doses. Asking if she should discontinue medication at this time.  Cc:Dr.Jertson  Routing to Dr.Miller for review and advise as Dr.Jertson is out of the office today. Okay to advise patient it will take a few weeks for symptom relief with Vagifem and she should continue using at this time?

## 2015-11-27 NOTE — Progress Notes (Signed)
Subjective:  Patient ID: Victoria Moss, female    DOB: 28-Apr-1951  Age: 66 y.o. MRN: [id number]  CC: Neck pain  HPI:  65 year old female presents with complaints of neck pain.  Patient states that she feels poorly. Patient states that she has been experiencing joint pains, aches, chills, vaginal burning and itching recently. This is an ongoing issue. She has a history of inflammatory arthritis which has not been adequately treated. Additionally, she is being followed by GYN regarding her vaginal burning and itching.  She has now developed neck pain. She states this is been going on for the past 3 days. She reports decreased range of motion of her neck. She reports the pain is severe and sharp in character. No known relieving factors. Exacerbated by movement. No reports of upper extremity numbness obtaining. No other complaints this time.  Social Hx   Social History   Social History  . Marital Status: Widowed    Spouse Name: N/A  . Number of Children: 2  . Years of Education: N/A   Occupational History  . Retired Other   Social History Main Topics  . Smoking status: Former Smoker -- 5 years    Quit date: 05/09/1973  . Smokeless tobacco: Never Used     Comment: Quit at age 42  . Alcohol Use: No  . Drug Use: No  . Sexual Activity: No   Other Topics Concern  . None   Social History Narrative   Review of Systems  Constitutional: Positive for chills and fatigue.  Genitourinary: Positive for vaginal pain.  Musculoskeletal: Positive for arthralgias and neck pain.   Objective:  BP 158/80 mmHg  Pulse 72  Temp(Src) 98 F (36.7 C) (Oral)  Wt 132 lb 8 oz (60.102 kg)  SpO2 98%  BP/Weight 11/27/2015 [id number]  Systolic BP 0000000 [id number]  Diastolic BP 80 72 80  Wt. (Lbs) 132.5 134.4 136  BMI 21.55 21.86 22.12   Physical Exam  Constitutional: She is oriented to person, place, and time. No distress.  Neck:  Decreased active range of motion as well as passive  range of motion in rotation. Left trapezius tender to palpation. Ropey and tight.  Cardiovascular: Normal rate and regular rhythm.   Pulmonary/Chest: Effort normal. She has no wheezes. She has no rales.  Neurological: She is alert and oriented to person, place, and time.  Psychiatric: She has a normal mood and affect.  Vitals reviewed.   Lab Results  Component Value Date   WBC 5.3 08/28/2015   HGB 12.7 08/28/2015   HCT 36.9 08/28/2015   PLT 292.0 08/28/2015   GLUCOSE 87 08/28/2015   CHOL 160 08/11/2014   TRIG 83.0 08/11/2014   HDL 59.40 08/11/2014   LDLCALC 84 08/11/2014   ALT 14 08/28/2015   AST 16 08/28/2015   NA 140 08/28/2015   K 4.5 08/28/2015   CL 104 08/28/2015   CREATININE 0.80 08/28/2015   BUN 13 08/28/2015   CO2 29 08/28/2015   TSH 0.91 08/28/2015   HGBA1C 5.6 12/31/2014    Assessment & Plan:   Problem List Items Addressed This Visit    Neck pain - Primary    New problem. Appears MSK in nature with spasm component. Treating with Flexeril.  Of note, patient had multiple other issues. I encouraged her to follow-up with her rheumatologist as well as her PCP.          Meds ordered this encounter  Medications  . cyclobenzaprine (FLEXERIL) 5  MG tablet    Sig: Take 1 tablet (5 mg total) by mouth 3 (three) times daily as needed for muscle spasms.    Dispense:  30 tablet    Refill:  1    Follow-up: Closely with PCP.  [place]

## 2015-11-27 NOTE — Assessment & Plan Note (Signed)
New problem. Appears MSK in nature with spasm component. Treating with Flexeril.  Of note, patient had multiple other issues. I encouraged her to follow-up with her rheumatologist as well as her PCP.

## 2015-11-27 NOTE — Patient Instructions (Signed)
Use heat.  Take the medication 3 times daily as needed.  Follow up closely with Dr. Nicki Reaper.  Take care and feel better  Dr. Lacinda Axon

## 2015-12-08 ENCOUNTER — Encounter: Payer: Self-pay | Admitting: Obstetrics and Gynecology

## 2015-12-08 ENCOUNTER — Ambulatory Visit (INDEPENDENT_AMBULATORY_CARE_PROVIDER_SITE_OTHER): Payer: Medicare Other

## 2015-12-08 ENCOUNTER — Ambulatory Visit (INDEPENDENT_AMBULATORY_CARE_PROVIDER_SITE_OTHER): Payer: Medicare Other | Admitting: Urology

## 2015-12-08 ENCOUNTER — Ambulatory Visit (INDEPENDENT_AMBULATORY_CARE_PROVIDER_SITE_OTHER): Payer: Medicare Other | Admitting: Obstetrics and Gynecology

## 2015-12-08 ENCOUNTER — Encounter: Payer: Self-pay | Admitting: Urology

## 2015-12-08 VITALS — BP 143/80 | HR 60 | Ht 65.0 in | Wt 133.7 lb

## 2015-12-08 VITALS — BP 108/64 | HR 68 | Wt 131.0 lb

## 2015-12-08 DIAGNOSIS — R350 Frequency of micturition: Secondary | ICD-10-CM

## 2015-12-08 DIAGNOSIS — R1032 Left lower quadrant pain: Secondary | ICD-10-CM

## 2015-12-08 DIAGNOSIS — R102 Pelvic and perineal pain: Secondary | ICD-10-CM

## 2015-12-08 DIAGNOSIS — N952 Postmenopausal atrophic vaginitis: Secondary | ICD-10-CM

## 2015-12-08 LAB — BLADDER SCAN AMB NON-IMAGING: SCAN RESULT: 0

## 2015-12-08 NOTE — Progress Notes (Signed)
12/08/2015 11:08 AM   Victoria Moss 03/26/1951 [id number]  Referring provider: Burnard Hawthorne, [place], [place] [address]  Chief Complaint  Patient presents with  . Urinary Frequency    referred by Dr. Einar Pheasant    HPI: Patient is a 65 year old Caucasian female with urinary frequency who is referred to [place] by Mable Paris, FNP for further evaluation and management.   Patient states she has had Urinary frequency and nocturia for several years. In May she developed a UTI and was treated with antibiotics and probiotics. She was experiencing dysuria and urinary frequency at that time.  After a course of antibiotics, her dysuria and frequency did not improve. A recheck of the urine noted no infection. She then was evaluated through gynecology and started on a steroid cream. The dysuria and frequency did not improve. She then had visits with her PCP and then was seen again with gynecology. At that appointment she was started on vaginal suppositories for her atrophy and the vaginal burning abated.  She is still experiencing frequency, voiding every 2 hours. She is also experiencing nocturia 3-4. She denied leakage gross hematuria. She states that she drinks a lot of water during the day. She has 2 cups of coffee daily and the Ginger Bier on occasion to settle her stomach due to her IBS.  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  Her PVR on today's exam is 0 mL.  PMH: Past Medical History:  Diagnosis Date  . Allergy   . Arthritis   . Basal cell cancer   . Diverticulosis    H/O  . GERD (gastroesophageal reflux disease)   . Heart murmur   . IBS (irritable bowel syndrome)   . Migraine    H/O, none since stopping chocolate  . Osteoporosis   . Rheumatic fever    H/O  . Status post dilation of esophageal narrowing      Surgical History: Past Surgical History:  Procedure Laterality Date  . APPENDECTOMY  1982  . BREAST BIOPSY Right 05/16   axillary node  . CESAREAN SECTION    . DILATION AND CURETTAGE OF UTERUS     x2  . DILATION AND CURETTAGE, DIAGNOSTIC / THERAPEUTIC     x 2  . fatty tumor     on back  . HERNIA REPAIR    . PARATHYROIDECTOMY    . SKIN CANCER EXCISION     BCCA   . TOE SURGERY Left    joint replacement bit toe  . TUBAL LIGATION    . UMBILICAL HERNIA REPAIR      Home Medications:    Medication List       Accurate as of 12/08/15 11:08 AM. Always use your most recent med list.          ALIVE WOMENS 50+ PO Take 1 tablet by mouth daily.   aspirin 81 MG tablet Take 81 mg by mouth daily.   betamethasone valerate ointment 0.1 % Commonly known as:  VALISONE Apply a pea sized amount topically BID x 1-2 weeks   CALTRATE 600 PO Take 1 tablet by mouth daily.   cyclobenzaprine 5 MG tablet Commonly known as:  FLEXERIL Take 1 tablet (5 mg total) by mouth 3 (three) times daily as needed for muscle spasms.   dicyclomine 10 MG  capsule Commonly known as:  BENTYL One capsule q day prn   Estradiol 10 MCG Tabs vaginal tablet Commonly known as:  VAGIFEM Place 1 tab intravaginally qhs x 1 week, then 2 x a week at hs   FISH OIL PO Take 1 tablet by mouth daily.   hydroxychloroquine 200 MG tablet Commonly known as:  PLAQUENIL Take 200 mg by mouth daily.   IBUPROFEN PO Take by mouth as needed. Reported on 09/25/2015   meloxicam 7.5 MG tablet Commonly known as:  MOBIC Take 7.5 mg by mouth daily.   omeprazole 20 MG capsule Commonly known as:  PRILOSEC Take 1 capsule (20 mg total) by mouth daily.   Probiotic 250 MG Caps Take by mouth daily.   Vitamin D (Cholecalciferol) 400 units Caps Take 1 tablet by mouth daily.       Allergies:  Allergies  Allergen Reactions  . Codeine Itching  . Penicillins Itching    Family History: Family History  Problem Relation  Age of Onset  . Breast cancer Mother 51  . Hypertension Mother   . Stroke Mother     Late 80  . Arthritis Mother   . Colon polyps Mother   . Hypertension Father   . Hyperlipidemia Father   . Diabetes Neg Hx   . Colon cancer Neg Hx   . Esophageal cancer Neg Hx   . Kidney disease Neg Hx   . Gallbladder disease Neg Hx     Social History:  reports that she quit smoking about 42 years ago. She quit after 5.00 years of use. She has never used smokeless tobacco. She reports that she does not drink alcohol or use drugs.  ROS: UROLOGY Frequent Urination?: Yes Hard to postpone urination?: No Burning/pain with urination?: No Get up at night to urinate?: Yes Leakage of urine?: No Urine stream starts and stops?: No Trouble starting stream?: No Do you have to strain to urinate?: No Blood in urine?: No Urinary tract infection?: No Sexually transmitted disease?: No Injury to kidneys or bladder?: No Painful intercourse?: No Weak stream?: No Currently pregnant?: No Vaginal bleeding?: No Last menstrual period?: n  Gastrointestinal Nausea?: No Vomiting?: No Indigestion/heartburn?: Yes Diarrhea?: No Constipation?: No  Constitutional Fever: No Night sweats?: No Weight loss?: No Fatigue?: Yes  Skin Skin rash/lesions?: No Itching?: No  Eyes Blurred vision?: No Double vision?: No  Ears/Nose/Throat Sore throat?: No Sinus problems?: No  Hematologic/Lymphatic Swollen glands?: No Easy bruising?: No  Cardiovascular Leg swelling?: No Chest pain?: No  Respiratory Cough?: No Shortness of breath?: No  Endocrine Excessive thirst?: No  Musculoskeletal Back pain?: No Joint pain?: Yes  Neurological Headaches?: No Dizziness?: No  Psychologic Depression?: No Anxiety?: Yes  Physical Exam: BP (!) 143/80   Pulse 60   Ht 5\' 5"  (1.651 m)   Wt 133 lb 11 oz (60.6 kg)   BMI 22.25 kg/m   Constitutional: Well nourished. Alert and oriented, No acute distress. HEENT: [place]  AT, moist mucus membranes. Trachea midline, no masses. Cardiovascular: No clubbing, cyanosis, or edema. Respiratory: Normal respiratory effort, no increased work of breathing. GI: Abdomen is soft, non tender, non distended, no abdominal masses. Liver and spleen not palpable.  No hernias appreciated.  Stool sample for occult testing is not indicated.   GU: No CVA tenderness.  No bladder fullness or masses.   Skin: No rashes, bruises or suspicious lesions. Lymph: No cervical or inguinal adenopathy. Neurologic: Grossly intact, no focal deficits, moving all 4 extremities. Psychiatric: Normal mood  and affect.  Laboratory Data: Lab Results  Component Value Date   WBC 5.3 08/28/2015   HGB 12.7 08/28/2015   HCT 36.9 08/28/2015   MCV 89.4 08/28/2015   PLT 292.0 08/28/2015    Lab Results  Component Value Date   CREATININE 0.80 08/28/2015    Lab Results  Component Value Date   HGBA1C 5.6 12/31/2014    Lab Results  Component Value Date   TSH 0.91 08/28/2015       Component Value Date/Time   CHOL 160 08/11/2014 0951   HDL 59.40 08/11/2014 0951   CHOLHDL 3 08/11/2014 0951   VLDL 16.6 08/11/2014 0951   LDLCALC 84 08/11/2014 0951    Lab Results  Component Value Date   AST 16 08/28/2015   Lab Results  Component Value Date   ALT 14 08/28/2015     Pertinent Imaging: Results for SERENDIPITY, THACKERAY (MRN [id number]) as of 12/08/2015 11:45  Ref. Range 12/08/2015 10:52  Scan Result Unknown 0    Assessment & Plan:    1. Urinary frequency:    We discussed behavioral therapies; bladder training, bladder control strategies, pelvic floor muscle training and fluid management.   I also discussed PTNS therapy with the patient.  We discussed anticholinergic therapy and beta-3 andrenoceptor agonist and the potential side effects of each therapy.  She would like to see if the frequency diminishes after a longer trial of the vaginal suppositories.  I did give her the PTNS booklet as she  would be most interested in this mode of therapy if the frequency does not diminished.  She will RTC in 3 months.    - BLADDER SCAN AMB NON-IMAGING  2. Vaginal atrophy:   Encouraged the patient to continue her vaginal suppositories.  I stated that it may help improve her urinary frequency. She'll use a 3 nights weekly. She will return in 3 months for exam and symptom recheck.   I spent 45 minutes in a face-to-face conversation concerning patient's symptoms and psychosocial issues as being only caregiver for her elderly patients.  Greater than 50% was spent in counseling & coordination of care with the patient.  Return in about 3 months (around 03/09/2016) for symptom recheck.  These notes generated with voice recognition software. I apologize for typographical errors.  Zara Council, [facility] [address], [place], [place] [address] [phone]

## 2015-12-08 NOTE — Progress Notes (Addendum)
GYNECOLOGY  VISIT   HPI: 65 y.o.   Widowed  Caucasian  female   [id number] with No LMP recorded. Patient is postmenopausal.   here for follow up LLQ pain.  She was started on vaginal estrogen a few weeks ago and her vaginal burning has resolved. She also notes an improvement in her urinary frequency  GYNECOLOGIC HISTORY: No LMP recorded. Patient is postmenopausal. Contraception:postmenopause  Menopausal hormone therapy: none         OB History    Gravida Para Term Preterm AB Living   3 2 2   1 2    SAB TAB Ectopic Multiple Live Births           2      Obstetric Comments   1st Menstrual Cycle:  13 1st Pregnancy:  18         Patient Active Problem List   Diagnosis Date Noted  . Neck pain 11/27/2015  . Dysuria 09/26/2015  . Abnormal mammogram 02/09/2015  . Hypercalcemia 02/06/2015  . Carotid bruit 12/03/2014  . Insomnia 10/27/2014  . Health care maintenance 08/10/2014  . Left hip pain 11/13/2013  . Family history of colonic polyps 07/02/2013  . Arthritis 12/23/2012  . IBS (irritable bowel syndrome) 12/23/2012  . Diverticulosis 12/23/2012  . GERD (gastroesophageal reflux disease) 12/23/2012  . Environmental allergies 12/23/2012  . Migraine 12/23/2012  . Osteoporosis     Past Medical History:  Diagnosis Date  . Allergy   . Arthritis   . Basal cell cancer   . Diverticulosis    H/O  . GERD (gastroesophageal reflux disease)   . Heart murmur   . IBS (irritable bowel syndrome)   . Migraine    H/O, none since stopping chocolate  . Osteoporosis   . Rheumatic fever    H/O  . Status post dilation of esophageal narrowing     Past Surgical History:  Procedure Laterality Date  . APPENDECTOMY  1982  . BREAST BIOPSY Right 05/16   axillary node  . CESAREAN SECTION    . DILATION AND CURETTAGE OF UTERUS     x2  . DILATION AND CURETTAGE, DIAGNOSTIC / THERAPEUTIC     x 2  . fatty tumor     on back  . HERNIA REPAIR    . PARATHYROIDECTOMY    . SKIN CANCER  EXCISION     BCCA   . TOE SURGERY Left    joint replacement bit toe  . TUBAL LIGATION    . UMBILICAL HERNIA REPAIR      Current Outpatient Prescriptions  Medication Sig Dispense Refill  . aspirin 81 MG tablet Take 81 mg by mouth daily.    . betamethasone valerate ointment (VALISONE) 0.1 % Apply a pea sized amount topically BID x 1-2 weeks 15 g 0  . Calcium Carbonate (CALTRATE 600 PO) Take 1 tablet by mouth daily.     . cyclobenzaprine (FLEXERIL) 5 MG tablet Take 1 tablet (5 mg total) by mouth 3 (three) times daily as needed for muscle spasms. 30 tablet 1  . dicyclomine (BENTYL) 10 MG capsule One capsule q day prn 90 capsule 1  . Estradiol (VAGIFEM) 10 MCG TABS vaginal tablet Place 1 tab intravaginally qhs x 1 week, then 2 x a week at hs 24 tablet 3  . hydroxychloroquine (PLAQUENIL) 200 MG tablet Take 200 mg by mouth daily.    . IBUPROFEN PO Take by mouth as needed. Reported on 09/25/2015    .  meloxicam (MOBIC) 7.5 MG tablet Take 7.5 mg by mouth daily.    . Multiple Vitamins-Minerals (ALIVE WOMENS 50+ PO) Take 1 tablet by mouth daily.     . Omega-3 Fatty Acids (FISH OIL PO) Take 1 tablet by mouth daily.     Marland Kitchen omeprazole (PRILOSEC) 20 MG capsule Take 1 capsule (20 mg total) by mouth daily. 90 capsule 1  . Vitamin D, Cholecalciferol, 400 UNITS CAPS Take 1 tablet by mouth daily.      No current facility-administered medications for this visit.      ALLERGIES: Codeine and Penicillins  Family History  Problem Relation Age of Onset  . Breast cancer Mother 16  . Hypertension Mother   . Stroke Mother     Late 86  . Arthritis Mother   . Colon polyps Mother   . Hypertension Father   . Hyperlipidemia Father   . Diabetes Neg Hx   . Colon cancer Neg Hx   . Esophageal cancer Neg Hx   . Kidney disease Neg Hx   . Gallbladder disease Neg Hx     Social History   Social History  . Marital status: Widowed    Spouse name: N/A  . Number of children: 2  . Years of education: N/A    Occupational History  . Retired Other   Social History Main Topics  . Smoking status: Former Smoker    Years: 5.00    Quit date: 05/09/1973  . Smokeless tobacco: Never Used     Comment: Quit at age 27  . Alcohol use No  . Drug use: No  . Sexual activity: No   Other Topics Concern  . Not on file   Social History Narrative  . No narrative on file    Review of Systems  Constitutional: Negative.   HENT: Negative.   Eyes: Negative.   Respiratory: Negative.   Cardiovascular: Negative.   Gastrointestinal: Negative.   Genitourinary: Negative.   Musculoskeletal: Negative.   Skin: Negative.   Neurological: Negative.   Endo/Heme/Allergies: Negative.   Psychiatric/Behavioral: Negative.     PHYSICAL EXAMINATION:    BP 108/64 (BP Location: Right Arm, Patient Position: Sitting, Cuff Size: Normal)   Pulse 68   Wt 131 lb (59.4 kg)   BMI 21.80 kg/m     General appearance: alert, cooperative and appears stated age   Ultrasound images reviewed  ASSESSMENT LLQ abdominal pain, normal GYN ultrasound. She does have some prominent vessels on the left, but pelvic congestion is not an issue in PMP women.  Vaginal atrophy, markedly improved since starting vaginal estrogen a few weeks ago    PLAN Recommend she f/u with her primary for further evaluation of her abdominal pain. We discussed colonoscopy, her last one was 7-8 years ago Continue vaginal estrogen Call with any concerns   An After Visit Summary was printed and given to the patient.   addendum: there was a small amount of fluid in her uterus, lining less than 3 mm. No symptoms, no bleeding, no f/u needed  15 minutes spent face to face, >50% in counseling

## 2015-12-14 DIAGNOSIS — M810 Age-related osteoporosis without current pathological fracture: Secondary | ICD-10-CM | POA: Diagnosis not present

## 2015-12-15 ENCOUNTER — Ambulatory Visit: Payer: Self-pay | Admitting: Internal Medicine

## 2015-12-15 DIAGNOSIS — M542 Cervicalgia: Secondary | ICD-10-CM | POA: Diagnosis not present

## 2015-12-15 DIAGNOSIS — M150 Primary generalized (osteo)arthritis: Secondary | ICD-10-CM | POA: Diagnosis not present

## 2015-12-15 DIAGNOSIS — M0609 Rheumatoid arthritis without rheumatoid factor, multiple sites: Secondary | ICD-10-CM | POA: Diagnosis not present

## 2015-12-15 DIAGNOSIS — Z79899 Other long term (current) drug therapy: Secondary | ICD-10-CM | POA: Diagnosis not present

## 2015-12-16 ENCOUNTER — Encounter: Payer: Self-pay | Admitting: Internal Medicine

## 2015-12-16 ENCOUNTER — Ambulatory Visit (INDEPENDENT_AMBULATORY_CARE_PROVIDER_SITE_OTHER): Payer: Medicare Other | Admitting: Internal Medicine

## 2015-12-16 VITALS — BP 130/80 | HR 72 | Temp 98.3°F | Ht 65.0 in | Wt 136.1 lb

## 2015-12-16 DIAGNOSIS — K219 Gastro-esophageal reflux disease without esophagitis: Secondary | ICD-10-CM | POA: Diagnosis not present

## 2015-12-16 DIAGNOSIS — M810 Age-related osteoporosis without current pathological fracture: Secondary | ICD-10-CM | POA: Diagnosis not present

## 2015-12-16 DIAGNOSIS — Z8371 Family history of colonic polyps: Secondary | ICD-10-CM

## 2015-12-16 DIAGNOSIS — Z83719 Family history of colon polyps, unspecified: Secondary | ICD-10-CM

## 2015-12-16 DIAGNOSIS — F439 Reaction to severe stress, unspecified: Secondary | ICD-10-CM

## 2015-12-16 DIAGNOSIS — M542 Cervicalgia: Secondary | ICD-10-CM | POA: Diagnosis not present

## 2015-12-16 DIAGNOSIS — E210 Primary hyperparathyroidism: Secondary | ICD-10-CM | POA: Diagnosis not present

## 2015-12-16 DIAGNOSIS — Z658 Other specified problems related to psychosocial circumstances: Secondary | ICD-10-CM

## 2015-12-16 DIAGNOSIS — M1990 Unspecified osteoarthritis, unspecified site: Secondary | ICD-10-CM

## 2015-12-16 DIAGNOSIS — R928 Other abnormal and inconclusive findings on diagnostic imaging of breast: Secondary | ICD-10-CM

## 2015-12-16 DIAGNOSIS — I773 Arterial fibromuscular dysplasia: Secondary | ICD-10-CM | POA: Diagnosis not present

## 2015-12-16 DIAGNOSIS — Z13220 Encounter for screening for lipoid disorders: Secondary | ICD-10-CM | POA: Diagnosis not present

## 2015-12-16 LAB — BASIC METABOLIC PANEL
BUN: 12 mg/dL (ref 6–23)
CALCIUM: 9.4 mg/dL (ref 8.4–10.5)
CO2: 31 mEq/L (ref 19–32)
Chloride: 101 mEq/L (ref 96–112)
Creatinine, Ser: 0.76 mg/dL (ref 0.40–1.20)
GFR: 81.03 mL/min (ref >60.00)
Glucose, Bld: 85 mg/dL (ref 70–99)
Potassium: 4.6 mEq/L (ref 3.5–5.1)
SODIUM: 136 meq/L (ref 135–145)

## 2015-12-16 LAB — HEPATIC FUNCTION PANEL
ALBUMIN: 4.3 g/dL (ref 3.5–5.2)
ALT: 30 U/L (ref 0–35)
AST: 30 U/L (ref 0–37)
Alkaline Phosphatase: 100 U/L (ref 39–117)
Bilirubin, Direct: 0.1 mg/dL (ref 0.0–0.3)
TOTAL PROTEIN: 7.3 g/dL (ref 6.0–8.3)
Total Bilirubin: 0.3 mg/dL (ref 0.2–1.2)

## 2015-12-16 LAB — LIPID PANEL
Cholesterol: 155 mg/dL (ref 0–200)
HDL: 55 mg/dL (ref >39.00)
LDL Cholesterol: 68 mg/dL (ref 0–99)
NonHDL: 99.85
Total CHOL/HDL Ratio: 3
Triglycerides: 160 mg/dL — ABNORMAL HIGH (ref 0.0–149.0)
VLDL: 32 mg/dL (ref 0.0–40.0)

## 2015-12-16 NOTE — Progress Notes (Signed)
Pre visit review using our clinic review tool, if applicable. No additional management support is needed unless otherwise documented below in the visit note. 

## 2015-12-16 NOTE — Progress Notes (Signed)
Patient ID: Victoria Moss, female   DOB: [birth year], 65 y.o.   MRN: [id number]   Subjective:    Patient ID: Victoria Moss, female    DOB: Oct 31, 1950, 65 y.o.   MRN: [id number]  HPI  Patient here for a scheduled follow up.  She is seeing endocrinology for hyperparathyroidism.  Stable.  Receiving reclast as well.  Recent creatinine .7 and vitamin D level wnl.  Also recently saw gyn for persistent vaginal irritation/itching.  Used estrogen cream.  This resolved.  Also had pelvic ultrasound.  Ok - per report.  She is still having some abdominal discomfort at times.  She is not sure etiology.  Feels may be related to stress and taking arthritis medication.  Occasionally will notice some minimal runny stool.  No constipation.  She does report her mother had polyps.  Last colonoscopy - 05/2009.  Due colonoscopy.  She also is seeing vascular surgery.  Had previous carotid ultrasound that revealed fibromuscular dysplasia.  Planning for f/u carotid ultrasound and renal ultrasound.  She also has recently seen a rheumatologist.  Having neck pain.  Had xray that revealed mild degenerative disc disease.  On meloxicam.  Also taking plaquenil.  Just started back on these medications.  She also reports increased stress.  Wants to see psychiatry.  Request to see Dr Nicolasa Ducking.     Past Medical History:  Diagnosis Date  . Allergy   . Arthritis   . Basal cell cancer   . Diverticulosis    H/O  . GERD (gastroesophageal reflux disease)   . Heart murmur   . IBS (irritable bowel syndrome)   . Migraine    H/O, none since stopping chocolate  . Osteoporosis   . Rheumatic fever    H/O  . Status post dilation of esophageal narrowing    Past Surgical History:  Procedure Laterality Date  . APPENDECTOMY  1982  . BREAST BIOPSY Right 05/16   axillary node  . CESAREAN SECTION    . DILATION AND CURETTAGE OF UTERUS     x2  . DILATION AND CURETTAGE, DIAGNOSTIC / THERAPEUTIC     x 2  . fatty tumor     on  back  . HERNIA REPAIR    . PARATHYROIDECTOMY    . SKIN CANCER EXCISION     BCCA   . TOE SURGERY Left    joint replacement bit toe  . TUBAL LIGATION    . UMBILICAL HERNIA REPAIR     Family History  Problem Relation Age of Onset  . Breast cancer Mother 65  . Hypertension Mother   . Stroke Mother     Late 68  . Arthritis Mother   . Colon polyps Mother   . Hypertension Father   . Hyperlipidemia Father   . Diabetes Neg Hx   . Colon cancer Neg Hx   . Esophageal cancer Neg Hx   . Kidney disease Neg Hx   . Gallbladder disease Neg Hx    Social History   Social History  . Marital status: Widowed    Spouse name: N/A  . Number of children: 2  . Years of education: N/A   Occupational History  . Retired Other   Social History Main Topics  . Smoking status: Former Smoker    Years: 5.00    Quit date: 05/09/1973  . Smokeless tobacco: Never Used     Comment: Quit at age 74  . Alcohol use No  . Drug use: No  .  Sexual activity: No   Other Topics Concern  . None   Social History Narrative  . None    Outpatient Encounter Prescriptions as of 12/16/2015  Medication Sig  . aspirin 81 MG tablet Take 81 mg by mouth daily.  . Calcium Carbonate (CALTRATE 600 PO) Take 1 tablet by mouth daily.   . cyclobenzaprine (FLEXERIL) 5 MG tablet Take 1 tablet (5 mg total) by mouth 3 (three) times daily as needed for muscle spasms.  Marland Kitchen dicyclomine (BENTYL) 10 MG capsule One capsule q day prn  . Estradiol (VAGIFEM) 10 MCG TABS vaginal tablet Place 1 tab intravaginally qhs x 1 week, then 2 x a week at hs  . hydroxychloroquine (PLAQUENIL) 200 MG tablet Take 200 mg by mouth daily.  . IBUPROFEN PO Take by mouth as needed. Reported on 09/25/2015  . meloxicam (MOBIC) 7.5 MG tablet Take 7.5 mg by mouth daily.  . Multiple Vitamins-Minerals (ALIVE WOMENS 50+ PO) Take 1 tablet by mouth daily.   . Omega-3 Fatty Acids (FISH OIL PO) Take 1 tablet by mouth daily.   Marland Kitchen omeprazole (PRILOSEC) 20 MG capsule Take 1  capsule (20 mg total) by mouth daily.  . Vitamin D, Cholecalciferol, 400 UNITS CAPS Take 1 tablet by mouth daily.   . [DISCONTINUED] betamethasone valerate ointment (VALISONE) 0.1 % Apply a pea sized amount topically BID x 1-2 weeks  . [DISCONTINUED] dicyclomine (BENTYL) 10 MG capsule One capsule q day prn  . [DISCONTINUED] omeprazole (PRILOSEC) 20 MG capsule Take 1 capsule (20 mg total) by mouth daily.   No facility-administered encounter medications on file as of 12/16/2015.     Review of Systems  Constitutional: Negative for appetite change and unexpected weight change.  HENT: Negative for congestion and sinus pressure.   Respiratory: Negative for cough, chest tightness and shortness of breath.   Cardiovascular: Negative for chest pain, palpitations and leg swelling.  Gastrointestinal:       Some intermittent abdominal discomfort.  Some occasional minimal runny stool.  No constipation.    Genitourinary: Negative for difficulty urinating and dysuria.  Musculoskeletal: Negative for back pain and joint swelling.  Skin: Negative for color change and rash.  Neurological: Negative for dizziness, light-headedness and headaches.  Psychiatric/Behavioral: Negative for agitation and dysphoric mood.       Increased stress.         Objective:    Physical Exam  Constitutional: She appears well-developed and well-nourished. No distress.  HENT:  Nose: Nose normal.  Mouth/Throat: Oropharynx is clear and moist.  Neck: Neck supple. No thyromegaly present.  Cardiovascular: Normal rate and regular rhythm.   Pulmonary/Chest: Breath sounds normal. No respiratory distress. She has no wheezes.  Abdominal: Soft. Bowel sounds are normal. There is no tenderness.  Musculoskeletal: She exhibits no edema or tenderness.  Lymphadenopathy:    She has no cervical adenopathy.  Skin: No rash noted. No erythema.  Psychiatric: She has a normal mood and affect. Her behavior is normal.    BP 130/80   Pulse 72    Temp 98.3 F (36.8 C) (Oral)   Ht 5\' 5"  (1.651 m)   Wt 136 lb 2 oz (61.7 kg)   SpO2 98%   BMI 22.65 kg/m  Wt Readings from Last 3 Encounters:  12/16/15 136 lb 2 oz (61.7 kg)  12/08/15 131 lb (59.4 kg)  12/08/15 133 lb 11 oz (60.6 kg)     Lab Results  Component Value Date   WBC 5.3 08/28/2015   HGB 12.7  08/28/2015   HCT 36.9 08/28/2015   PLT 292.0 08/28/2015   GLUCOSE 85 12/16/2015   CHOL 155 12/16/2015   TRIG 160.0 (H) 12/16/2015   HDL 55.00 12/16/2015   LDLCALC 68 12/16/2015   ALT 30 12/16/2015   AST 30 12/16/2015   NA 136 12/16/2015   K 4.6 12/16/2015   CL 101 12/16/2015   CREATININE 0.76 12/16/2015   BUN 12 12/16/2015   CO2 31 12/16/2015   TSH 0.91 08/28/2015   HGBA1C 5.6 12/31/2014    Mm Diag Breast Tomo Bilateral  Result Date: 09/03/2015 CLINICAL DATA:  65 year old female last seen in April 2016 at which time an enlarged right axillary lymph node was identified for which biopsy was recommended. Ultrasound-guided biopsy of this lymph node was performed by Dr. Bary Castilla in his office. Biopsy results demonstrated benign lymph node tissue. No malignancy was identified. The patient is here for follow-up. EXAM: 2D DIGITAL DIAGNOSTIC BILATERAL MAMMOGRAM WITH CAD AND ADJUNCT TOMO COMPARISON:  Previous exam(s). ACR Breast Density Category c: The breast tissue is heterogeneously dense, which may obscure small masses. FINDINGS: No suspicious mass, calcifications, or other abnormality is identified within either breast. The visualized portion of the enlarged right axillary lymph node appears unchanged from the 2012 mammogram. As noted above, this lymph node was biopsied by Dr. Bary Castilla under ultrasound guidance demonstrating benign results. Mammographic images were processed with CAD. IMPRESSION: No mammographic evidence of malignancy. RECOMMENDATION: Screening mammogram in one year.(Code:SM-B-01Y) I have discussed the findings and recommendations with the patient. Results were also  provided in writing at the conclusion of the visit. If applicable, a reminder letter will be sent to the patient regarding the next appointment. BI-RADS CATEGORY  2: Benign. Electronically Signed   By: Pamelia Hoit M.D.   On: 09/03/2015 14:23       Assessment & Plan:   Problem List Items Addressed This Visit    Abnormal mammogram    Previous biopsy - ok.  F/u mammogram 09/03/15 - [facility].        Arthritis    Seeing rheumatology.  Had neck xray as outlined.  On plaquenil and meloxicam.  Continue f/u with rheumatology.        Family history of colonic polyps    Mother had polyps.  Colonoscopy 05/27/09 - diverticulosis.  Per report, previously recommended f/u 2021.  Given family history and bowel change and abdominal discomfort, will refer back to GI for evaluation and question of need for colonoscopy.        Relevant Orders   Ambulatory referral to Gastroenterology   Fibromuscular dysplasia [facility])    Seeing vascular surgery.  Planning for f/u carotid ultrasound and renal ultrasound.  Continue to follow blood pressure and cholesterol.        Relevant Orders   Lipid panel (Completed)   GERD (gastroesophageal reflux disease)    EGD 04/2010 as outlined.  Symptoms controlled on omeprazole.        Relevant Medications   dicyclomine (BENTYL) 10 MG capsule   omeprazole (PRILOSEC) 20 MG capsule   Neck pain - Primary    Saw rheumatology.  Had xray - mild degenerative disc disease.  On meloxicam.  Also taking plaquenil.  Follow.       Osteoporosis    Receiving reclast.  Follow.       Primary hyperparathyroidism ([place])    Primary.  Seeing endocrinology.  Stable.        Relevant Orders   Hepatic function panel (Completed)  Basic metabolic panel (Completed)   Stress    Increased stress as outlined.  Request referral to see Dr Nicolasa Ducking.        Relevant Orders   Ambulatory referral to Psychiatry    Other Visit Diagnoses    Screening cholesterol level           Einar Pheasant,  MD

## 2015-12-17 ENCOUNTER — Encounter: Payer: Self-pay | Admitting: Internal Medicine

## 2015-12-17 DIAGNOSIS — F411 Generalized anxiety disorder: Secondary | ICD-10-CM | POA: Insufficient documentation

## 2015-12-17 MED ORDER — DICYCLOMINE HCL 10 MG PO CAPS
ORAL_CAPSULE | ORAL | 0 refills | Status: DC
Start: 2015-12-17 — End: 2016-12-09

## 2015-12-17 MED ORDER — OMEPRAZOLE 20 MG PO CPDR: 20.0000 mg | DELAYED_RELEASE_CAPSULE | Freq: Every day | ORAL | 1 refills | Status: DC

## 2015-12-17 NOTE — Assessment & Plan Note (Signed)
EGD 04/2010 as outlined.  Symptoms controlled on omeprazole.

## 2015-12-17 NOTE — Assessment & Plan Note (Signed)
Primary.  Seeing endocrinology.  Stable.

## 2015-12-17 NOTE — Assessment & Plan Note (Signed)
Receiving reclast.  Follow.

## 2015-12-17 NOTE — Assessment & Plan Note (Signed)
Seeing rheumatology.  Had neck xray as outlined.  On plaquenil and meloxicam.  Continue f/u with rheumatology.

## 2015-12-17 NOTE — Assessment & Plan Note (Signed)
Saw rheumatology.  Had xray - mild degenerative disc disease.  On meloxicam.  Also taking plaquenil.  Follow.

## 2015-12-17 NOTE — Assessment & Plan Note (Signed)
Previous biopsy - ok.  F/u mammogram 09/03/15 - [facility] II.

## 2015-12-17 NOTE — Assessment & Plan Note (Signed)
Increased stress as outlined.  Request referral to see Dr Nicolasa Ducking.

## 2015-12-17 NOTE — Assessment & Plan Note (Signed)
Mother had polyps.  Colonoscopy 05/27/09 - diverticulosis.  Per report, previously recommended f/u 2021.  Given family history and bowel change and abdominal discomfort, will refer back to GI for evaluation and question of need for colonoscopy.

## 2015-12-17 NOTE — Assessment & Plan Note (Signed)
Seeing vascular surgery.  Planning for f/u carotid ultrasound and renal ultrasound.  Continue to follow blood pressure and cholesterol.

## 2015-12-18 ENCOUNTER — Telehealth: Payer: Self-pay | Admitting: *Deleted

## 2015-12-18 NOTE — Telephone Encounter (Signed)
Please advise 

## 2015-12-18 NOTE — Telephone Encounter (Signed)
Misty from [facility] called to see if we Dr. Nicki Reaper can prescribe  prednisone taper for the patient . This medication was prescribed by Dr. Lenna Gilford. The pharmacists did call the office but the office closed for the day and she was wondering if Dr. Nicki Reaper could write the prescription for the patient. If you have any questions you can call the patient  Her call back number is [phone]. Thanks

## 2015-12-18 NOTE — Telephone Encounter (Signed)
Called pt.  Sees Dr Lenna Gilford for her neck.  With increased pain.  Was going to call in steroid dose pack, but was not called in.  Pharmacy called requesting prednisone call in.  Discussed with pt.  Discussed symptoms.  Offered to call in since weekend.  She preferred to wait and let Dr Lenna Gilford refill.

## 2015-12-21 ENCOUNTER — Encounter: Payer: Self-pay | Admitting: Internal Medicine

## 2015-12-21 NOTE — Telephone Encounter (Signed)
Victoria Moss, see note.  can you schedule her here in [place] instead of [place].  She has decided wants to go to [place].  She has been getting her colonoscopy q 5 years because of her family history of colon polyps.

## 2015-12-22 DIAGNOSIS — L708 Other acne: Secondary | ICD-10-CM | POA: Diagnosis not present

## 2015-12-24 ENCOUNTER — Ambulatory Visit (INDEPENDENT_AMBULATORY_CARE_PROVIDER_SITE_OTHER): Payer: Medicare Other | Admitting: Family Medicine

## 2015-12-24 VITALS — BP 162/94 | HR 68 | Temp 97.7°F | Wt 136.0 lb

## 2015-12-24 DIAGNOSIS — M79603 Pain in arm, unspecified: Secondary | ICD-10-CM

## 2015-12-24 DIAGNOSIS — M79602 Pain in left arm: Principal | ICD-10-CM

## 2015-12-24 DIAGNOSIS — M79601 Pain in right arm: Secondary | ICD-10-CM

## 2015-12-24 DIAGNOSIS — R202 Paresthesia of skin: Secondary | ICD-10-CM | POA: Diagnosis not present

## 2015-12-24 DIAGNOSIS — M542 Cervicalgia: Secondary | ICD-10-CM | POA: Diagnosis not present

## 2015-12-24 NOTE — Progress Notes (Signed)
  Tommi Rumps, MD Phone: [phone]  Victoria Moss is a 65 y.o. female who presents today for same-day visit.  Patient notes about a month of cervical spine and neck pain. Saw her rheumatologist who did an x-ray and noted mild degenerative changes. Notes the pain has gotten worse over the last several weeks. Notes it is a sharp discomfort in the bilateral musculature of her neck. Hurts most with turning her neck side to side. Has started to get some tingling down the backs of her arms intermittently. No numbness or weakness. No known injury. Has been doing meloxicam with little benefit. Was prescribed prednisone by her rheumatologist though she has not started this yet.  ROS see history of present illness  Objective  Physical Exam Vitals:   12/24/15 1031  BP: (!) 162/94  Pulse: 68  Temp: 97.7 F (36.5 C)    BP Readings from Last 3 Encounters:  12/24/15 (!) 162/94  12/16/15 130/80  12/08/15 108/64   Wt Readings from Last 3 Encounters:  12/24/15 136 lb (61.7 kg)  12/16/15 136 lb 2 oz (61.7 kg)  12/08/15 131 lb (59.4 kg)    Physical Exam  Constitutional: No distress.  Cardiovascular: Normal rate, regular rhythm and normal heart sounds.   Pulmonary/Chest: Effort normal and breath sounds normal.  Musculoskeletal:  No midline spine tenderness, no midline spine step-off, there is muscular tenderness in her bilateral neck, full range of motion rotational and her neck though has discomfort with this, negative Spurling's  Neurological: She is alert. Gait normal.  5/5 strength in bilateral biceps, triceps, grip, quads, hamstrings, plantar and dorsiflexion, sensation to light touch intact in bilateral UE and LE, normal gait, 2+ patellar reflexes  Skin: Skin is warm and dry. She is not diaphoretic.     Assessment/Plan: Please see individual problem list.  Neck pain Patient with continued neck discomfort. Not responsive to meloxicam. Sounds as though she has  paresthesias with tingling down the backs of her arms. Suspect likely nerve impingement versus arthritic changes versus muscular strain. Discussed starting on prednisone to see if this is beneficial. Advised of risks of this medicine. Advised not to take meloxicam with it. We will also obtain an MRI given her tingling in her arms. She is given return precautions. BP likely elevated related to discomfort today. She'll continue to monitor this.  Orders Placed This Encounter  Procedures  . MR Cervical Spine Wo Contrast    Standing Status:   Future    Standing Expiration Date:   02/22/2017    Order Specific Question:   Reason for Exam (SYMPTOM  OR DIAGNOSIS REQUIRED)    Answer:   neck pain with paresthesias bilateral arms for 1 month    Order Specific Question:   Preferred imaging location?    Answer:   [facility] (table limit-300lbs)    Order Specific Question:   What is the patient's sedation requirement?    Answer:   No Sedation    Order Specific Question:   Does the patient have a pacemaker or implanted devices?    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

## 2015-12-24 NOTE — Patient Instructions (Signed)
Nice to meet you. We will order an MRI of your neck to evaluate further. You should start on the prednisone that was previously prescribed. If you develop numbness, weakness, worsening pain, or fevers, or any new or changing symptoms please seek medical attention.

## 2015-12-24 NOTE — Assessment & Plan Note (Signed)
Patient with continued neck discomfort. Not responsive to meloxicam. Sounds as though she has paresthesias with tingling down the backs of her arms. Suspect likely nerve impingement versus arthritic changes versus muscular strain. Discussed starting on prednisone to see if this is beneficial. Advised of risks of this medicine. Advised not to take meloxicam with it. We will also obtain an MRI given her tingling in her arms. She is given return precautions.

## 2015-12-24 NOTE — Progress Notes (Signed)
Pre visit review using our clinic review tool, if applicable. No additional management support is needed unless otherwise documented below in the visit note. 

## 2016-01-07 ENCOUNTER — Telehealth: Payer: Self-pay | Admitting: Obstetrics and Gynecology

## 2016-01-07 ENCOUNTER — Ambulatory Visit
Admission: RE | Admit: 2016-01-07 | Discharge: 2016-01-07 | Disposition: A | Discharge status: Home / Self Care | Payer: Medicare Other | Source: Ambulatory Visit | Attending: Family Medicine | Admitting: Family Medicine

## 2016-01-07 DIAGNOSIS — M79602 Pain in left arm: Secondary | ICD-10-CM | POA: Diagnosis not present

## 2016-01-07 DIAGNOSIS — R202 Paresthesia of skin: Secondary | ICD-10-CM | POA: Diagnosis not present

## 2016-01-07 DIAGNOSIS — M47812 Spondylosis without myelopathy or radiculopathy, cervical region: Secondary | ICD-10-CM | POA: Insufficient documentation

## 2016-01-07 DIAGNOSIS — M79601 Pain in right arm: Secondary | ICD-10-CM | POA: Insufficient documentation

## 2016-01-07 NOTE — Telephone Encounter (Signed)
Patient think the Vagifem she is using is no longer working for her due to the increased burning she is having.

## 2016-01-07 NOTE — Telephone Encounter (Signed)
Patient returned call. Patient states that when she first starting using the vagifem it was working well, but for the last 2-3 days the burning has started coming back. Patient is unsure if it is from the vagifem or something else. Office visit offered with Dr. Talbert Nan, but patient states she is leaving to go out of town tomorrow. RN advised this message would be sent to Dr. Talbert Nan for review and our office would call with any additional recommendations. Patient agreeable. Patient states it is okay to leave a detailed message on her cell phone as she will be on a plane tomorrow and unable to answer.   Routing to provider for review.

## 2016-01-07 NOTE — Telephone Encounter (Signed)
Message left to return call to Triage Nurse at [phone].    

## 2016-01-07 NOTE — Telephone Encounter (Signed)
I would make sure she is using her vagifem 2 x a week. She could temporarily increase to 3 x a week for the next week to see if that helps. She can use vaseline externally. I would recommend she be seen when she gets back and if her symptoms are severe, she should be seen by an MD wherever she is.

## 2016-01-08 NOTE — Telephone Encounter (Signed)
Left message per DPR and per patient request due to traveling, of message as seen below from Dr. Talbert Nan. Instructed patient to call our office to schedule an office visit to be seen for evaluation of symptoms by Dr. Talbert Nan when she is back in town. Also instructed patient to seek medical care while traveling if her symptoms become severe.    Routing to provider for final review. Patient agreeable to disposition. Will close encounter.

## 2016-01-25 ENCOUNTER — Encounter: Payer: Self-pay | Admitting: Obstetrics and Gynecology

## 2016-01-25 ENCOUNTER — Ambulatory Visit (INDEPENDENT_AMBULATORY_CARE_PROVIDER_SITE_OTHER): Payer: Medicare Other | Admitting: Obstetrics and Gynecology

## 2016-01-25 VITALS — BP 120/82 | HR 68 | Resp 16 | Ht 65.0 in | Wt 137.8 lb

## 2016-01-25 DIAGNOSIS — N762 Acute vulvitis: Secondary | ICD-10-CM

## 2016-01-25 NOTE — Progress Notes (Signed)
GYNECOLOGY  VISIT   HPI: 65 y.o.   Widowed  Caucasian  female   [id number] with No LMP recorded. Patient is postmenopausal.   here for Vaginal itching & burning. Symptoms started several weeks ago. She is wondering that her toilet paper is irritating her, she just stopped using it. She is using hypoallergenic soaps and detergents. She has been using Vaseline which is helping a little. She has gotten fragrance free and chlorine free toilet paper. She is using Vagifem 2-3 x a week. The irritation is not just vaginal, it is also in the perianal area. Burning worse than itching. The itching is very mild.  No urinary urgency or dysuria. No change in urinary frequency.  She had been treated with vaginal estrogen this summer which helped prior burning.   She is not sexually active.   GYNECOLOGIC HISTORY: No LMP recorded. Patient is postmenopausal. Contraception:Postmenopause Menopausal hormone therapy: Vagifem        OB History    Gravida Para Term Preterm AB Living   3 2 2   1 2    SAB TAB Ectopic Multiple Live Births           2      Obstetric Comments   1st Menstrual Cycle:  13 1st Pregnancy:  18         Patient Active Problem List   Diagnosis Date Noted  . Stress 12/17/2015  . Neck pain 11/27/2015  . Dysuria 09/26/2015  . Abnormal mammogram 02/09/2015  . Primary hyperparathyroidism ([place]) 02/06/2015  . Fibromuscular dysplasia ([place]) 12/03/2014  . Insomnia 10/27/2014  . Health care maintenance 08/10/2014  . Left hip pain 11/13/2013  . Family history of colonic polyps 07/02/2013  . Arthritis 12/23/2012  . IBS (irritable bowel syndrome) 12/23/2012  . Diverticulosis 12/23/2012  . GERD (gastroesophageal reflux disease) 12/23/2012  . Environmental allergies 12/23/2012  . Migraine 12/23/2012  . Osteoporosis     Past Medical History:  Diagnosis Date  . Allergy   . Arthritis   . Basal cell cancer   . Diverticulosis    H/O  . GERD (gastroesophageal reflux disease)   . Heart  murmur   . IBS (irritable bowel syndrome)   . Migraine    H/O, none since stopping chocolate  . Osteoporosis   . Rheumatic fever    H/O  . Status post dilation of esophageal narrowing     Past Surgical History:  Procedure Laterality Date  . APPENDECTOMY  1982  . BREAST BIOPSY Right 05/16   axillary node  . CESAREAN SECTION    . DILATION AND CURETTAGE OF UTERUS     x2  . DILATION AND CURETTAGE, DIAGNOSTIC / THERAPEUTIC     x 2  . fatty tumor     on back  . HERNIA REPAIR    . PARATHYROIDECTOMY    . SKIN CANCER EXCISION     BCCA   . TOE SURGERY Left    joint replacement bit toe  . TUBAL LIGATION    . UMBILICAL HERNIA REPAIR      Current Outpatient Prescriptions  Medication Sig Dispense Refill  . aspirin 81 MG tablet Take 81 mg by mouth daily.    . Calcium Carbonate (CALTRATE 600 PO) Take 1 tablet by mouth daily.     . cyclobenzaprine (FLEXERIL) 5 MG tablet Take 1 tablet (5 mg total) by mouth 3 (three) times daily as needed for muscle spasms. 30 tablet 1  . dicyclomine (BENTYL) 10 MG capsule One capsule  q day prn 90 capsule 0  . Estradiol (VAGIFEM) 10 MCG TABS vaginal tablet Place 1 tab intravaginally qhs x 1 week, then 2 x a week at hs 24 tablet 3  . hydroxychloroquine (PLAQUENIL) 200 MG tablet Take 200 mg by mouth daily.    . meloxicam (MOBIC) 7.5 MG tablet Take 7.5 mg by mouth daily.    . Multiple Vitamins-Minerals (ALIVE WOMENS 50+ PO) Take 1 tablet by mouth daily.     . Omega-3 Fatty Acids (FISH OIL PO) Take 1 tablet by mouth daily.     Marland Kitchen omeprazole (PRILOSEC) 20 MG capsule Take 1 capsule (20 mg total) by mouth daily. 90 capsule 1  . Vitamin D, Cholecalciferol, 400 UNITS CAPS Take 1 tablet by mouth daily.      No current facility-administered medications for this visit.      ALLERGIES: Codeine and Penicillins  Family History  Problem Relation Age of Onset  . Breast cancer Mother 72  . Hypertension Mother   . Stroke Mother     Late 62  . Arthritis Mother   .  Colon polyps Mother   . Hypertension Father   . Hyperlipidemia Father   . Diabetes Neg Hx   . Colon cancer Neg Hx   . Esophageal cancer Neg Hx   . Kidney disease Neg Hx   . Gallbladder disease Neg Hx     Social History   Social History  . Marital status: Widowed    Spouse name: N/A  . Number of children: 2  . Years of education: N/A   Occupational History  . Retired Other   Social History Main Topics  . Smoking status: Former Smoker    Years: 5.00    Quit date: 05/09/1973  . Smokeless tobacco: Never Used     Comment: Quit at age 18  . Alcohol use No  . Drug use: No  . Sexual activity: No   Other Topics Concern  . Not on file   Social History Narrative  . No narrative on file    Review of Systems  Constitutional: Negative.   HENT: Negative.   Eyes: Negative.   Respiratory: Negative.   Cardiovascular: Negative.   Gastrointestinal: Negative.   Genitourinary:       Vaginal itching Vaginal burning   Musculoskeletal: Negative.   Skin: Negative.   Neurological: Negative.   Endo/Heme/Allergies: Negative.   Psychiatric/Behavioral: Negative.     PHYSICAL EXAMINATION:    Ht 5\' 5"  (1.651 m)   Wt 137 lb 12.8 oz (62.5 kg)   BMI 22.93 kg/m  BP 120/82 Right Arm, Sitting, Normal  Pulse 68  Resp 16   General appearance: alert, cooperative and appears stated age  Pelvic: External genitalia:  no lesions              Urethra:  normal appearing urethra with no masses, tenderness or lesions              Bartholins and Skenes: normal                 Vagina: normal appearing atrophic vagina with normal color, no discharge, no lesions              Cervix: no lesions               Chaperone was present for exam.  Wet prep: ? clue, no trich, rare wbc KOH: no yeast PH: 5   ASSESSMENT Vulvar and perianal burning, slight irritation Vaginal atrophy,  symptoms have improved    PLAN Send wet prep probe She has Valisone at home, can use 2 x a day for up to a  week Discussed vulvar skin care Can use Vaseline or Coconut Oil as needed Continue with vaginal estrogen   An After Visit Summary was printed and given to the patient.  15 minutes face to face time of which over 50% was spent in counseling.

## 2016-01-26 ENCOUNTER — Encounter: Payer: Self-pay | Admitting: Obstetrics and Gynecology

## 2016-01-26 DIAGNOSIS — G4700 Insomnia, unspecified: Secondary | ICD-10-CM | POA: Diagnosis not present

## 2016-01-26 DIAGNOSIS — F411 Generalized anxiety disorder: Secondary | ICD-10-CM | POA: Diagnosis not present

## 2016-01-26 LAB — WET PREP BY MOLECULAR PROBE
CANDIDA SPECIES: NEGATIVE
GARDNERELLA VAGINALIS: NEGATIVE
Trichomonas vaginosis: NEGATIVE

## 2016-01-27 ENCOUNTER — Telehealth: Payer: Self-pay | Admitting: *Deleted

## 2016-01-27 NOTE — Telephone Encounter (Signed)
Me  to Kelli Churn Agcaoili      1:55 PM  Hi Becky,   The ointment that you are referring to is an anti-fungal and a steroid ointment. The valisone is just a steroid ointment. Your yeast culture was negative, so she doesn't think that an anti-fungal would help. If you have any additional questions, please feel free to contact us.   Sincerely,  Glorianne Manchester, RN

## 2016-01-27 NOTE — Telephone Encounter (Signed)
Dr. Talbert Nan, please see MyChart message below and advise?  From Kelli Churn Gebhart To Salvadore Dom, MD Sent 01/26/2016 3:12 PM  I was looking through my records for 2011 ([facility], [place], [place]. Dr. Waymond Cera) and it stated that I had been using Premarin cream for similar burning and itching with no improvement and then I was then prescribed nystatin-triamcinolone 100000 unit/gram-0.1% ointment to use twice a day. I was just wondering if that was similar to the betamethasone valerate ointment that you prescribed for me.  Thank you,  Loran Senters

## 2016-01-27 NOTE — Telephone Encounter (Signed)
Please see telephone encounter dated 01/27/16.

## 2016-01-27 NOTE — Telephone Encounter (Signed)
Please let her know that the ointment she is referring to is an anti-fungal and a steroid ointment. The valisone is just a steroid ointment. Her yeast culture is negative, so I don't think an anti-fungal would help.

## 2016-01-28 ENCOUNTER — Encounter: Payer: Self-pay | Admitting: Internal Medicine

## 2016-02-05 DIAGNOSIS — F4323 Adjustment disorder with mixed anxiety and depressed mood: Secondary | ICD-10-CM | POA: Diagnosis not present

## 2016-02-05 DIAGNOSIS — Z600 Problems of adjustment to life-cycle transitions: Secondary | ICD-10-CM | POA: Diagnosis not present

## 2016-02-09 DIAGNOSIS — F411 Generalized anxiety disorder: Secondary | ICD-10-CM | POA: Diagnosis not present

## 2016-02-09 DIAGNOSIS — G4700 Insomnia, unspecified: Secondary | ICD-10-CM | POA: Diagnosis not present

## 2016-02-10 ENCOUNTER — Ambulatory Visit (INDEPENDENT_AMBULATORY_CARE_PROVIDER_SITE_OTHER): Payer: Medicare Other

## 2016-02-10 DIAGNOSIS — Z23 Encounter for immunization: Secondary | ICD-10-CM

## 2016-02-22 DIAGNOSIS — Z600 Problems of adjustment to life-cycle transitions: Secondary | ICD-10-CM | POA: Diagnosis not present

## 2016-02-22 DIAGNOSIS — F4323 Adjustment disorder with mixed anxiety and depressed mood: Secondary | ICD-10-CM | POA: Diagnosis not present

## 2016-02-26 DIAGNOSIS — K219 Gastro-esophageal reflux disease without esophagitis: Secondary | ICD-10-CM | POA: Diagnosis not present

## 2016-03-04 DIAGNOSIS — F411 Generalized anxiety disorder: Secondary | ICD-10-CM | POA: Diagnosis not present

## 2016-03-04 DIAGNOSIS — G4700 Insomnia, unspecified: Secondary | ICD-10-CM | POA: Diagnosis not present

## 2016-03-09 ENCOUNTER — Ambulatory Visit: Payer: Medicare Other | Admitting: Urology

## 2016-03-17 DIAGNOSIS — Z79899 Other long term (current) drug therapy: Secondary | ICD-10-CM | POA: Diagnosis not present

## 2016-03-17 DIAGNOSIS — M0609 Rheumatoid arthritis without rheumatoid factor, multiple sites: Secondary | ICD-10-CM | POA: Diagnosis not present

## 2016-03-17 DIAGNOSIS — M150 Primary generalized (osteo)arthritis: Secondary | ICD-10-CM | POA: Diagnosis not present

## 2016-03-18 ENCOUNTER — Other Ambulatory Visit (INDEPENDENT_AMBULATORY_CARE_PROVIDER_SITE_OTHER): Payer: Self-pay | Admitting: Vascular Surgery

## 2016-03-18 DIAGNOSIS — I773 Arterial fibromuscular dysplasia: Secondary | ICD-10-CM

## 2016-03-21 ENCOUNTER — Ambulatory Visit (INDEPENDENT_AMBULATORY_CARE_PROVIDER_SITE_OTHER): Payer: Medicare Other

## 2016-03-21 ENCOUNTER — Encounter (INDEPENDENT_AMBULATORY_CARE_PROVIDER_SITE_OTHER): Payer: Self-pay | Admitting: Vascular Surgery

## 2016-03-21 ENCOUNTER — Ambulatory Visit (INDEPENDENT_AMBULATORY_CARE_PROVIDER_SITE_OTHER): Payer: Medicare Other | Admitting: Vascular Surgery

## 2016-03-21 VITALS — BP 173/87 | HR 72 | Resp 17 | Ht 65.75 in | Wt 138.0 lb

## 2016-03-21 DIAGNOSIS — I779 Disorder of arteries and arterioles, unspecified: Secondary | ICD-10-CM | POA: Insufficient documentation

## 2016-03-21 DIAGNOSIS — M1990 Unspecified osteoarthritis, unspecified site: Secondary | ICD-10-CM | POA: Diagnosis not present

## 2016-03-21 DIAGNOSIS — I773 Arterial fibromuscular dysplasia: Secondary | ICD-10-CM

## 2016-03-21 DIAGNOSIS — K219 Gastro-esophageal reflux disease without esophagitis: Secondary | ICD-10-CM | POA: Diagnosis not present

## 2016-03-21 DIAGNOSIS — I6523 Occlusion and stenosis of bilateral carotid arteries: Secondary | ICD-10-CM | POA: Diagnosis not present

## 2016-03-21 DIAGNOSIS — I701 Atherosclerosis of renal artery: Secondary | ICD-10-CM | POA: Diagnosis not present

## 2016-03-21 DIAGNOSIS — I6529 Occlusion and stenosis of unspecified carotid artery: Secondary | ICD-10-CM | POA: Insufficient documentation

## 2016-03-21 NOTE — Progress Notes (Signed)
MRN : [id number]  Victoria Moss is a 65 y.o. (1951/01/06) female who presents with chief complaint of  Chief Complaint  Patient presents with  . Follow-up  .  History of Present Illness: The patient is seen for follow up evaluation of carotid stenosis Associated with fibromuscular dysplasia. Her renal arteries are also involved and these are also under surveillance. The carotid arteries and renal arteries are followed by ultrasound.   The patient denies amaurosis fugax. There is no recent history of TIA symptoms or focal motor deficits. There is no prior documented CVA.  She denies knowledge of any deterioration of her renal function. She has not had a renal associated complications over the past 6 months. She denies changes in her hypertension or the acute elevation of her blood pressure.  The patient is taking enteric-coated aspirin 81 mg daily.  There is no history of migraine headaches. There is no history of seizures.  The patient has a history of coronary artery disease, no recent episodes of angina or shortness of breath. The patient denies PAD or claudication symptoms. There is a history of hyperlipidemia which is being treated with a statin.    Carotid Duplex done today shows 40-59% diameter reduction bilateral internal carotid arteries. Changes consistent with fibromuscular dysplasia are again noted.  No change compared to last study in 08/31/2015  Renal artery duplex done today shows 50-69% diameter reduction bilateral renal arteries. The renal artery to aortic ratio remains less than 1.5. No significant change when compared to the previous study 08/31/2015.  Current Meds  Medication Sig  . aspirin 81 MG tablet Take 81 mg by mouth daily.  . Calcium Carb-Cholecalciferol (CALCIUM 600+D3 PO) Take by mouth.  . dicyclomine (BENTYL) 10 MG capsule One capsule q day prn  . DULoxetine (CYMBALTA) 30 MG capsule Take by mouth.  . hydroxychloroquine (PLAQUENIL) 200 MG  tablet Take 200 mg by mouth daily.  . meloxicam (MOBIC) 7.5 MG tablet Take 7.5 mg by mouth daily.  . Multiple Vitamins-Minerals (ALIVE WOMENS 50+ PO) Take 1 tablet by mouth daily.   . Omega-3 Fatty Acids (FISH OIL PO) Take 1 tablet by mouth daily.   Marland Kitchen omeprazole (PRILOSEC) 20 MG capsule Take 1 capsule (20 mg total) by mouth daily.  . Turmeric 500 MG CAPS Take by mouth daily.    Past Medical History:  Diagnosis Date  . Allergy   . Arthritis   . Basal cell cancer   . Diverticulosis    H/O  . GERD (gastroesophageal reflux disease)   . Heart murmur   . IBS (irritable bowel syndrome)   . Migraine    H/O, none since stopping chocolate  . Osteoporosis   . Rheumatic fever    H/O  . Status post dilation of esophageal narrowing     Past Surgical History:  Procedure Laterality Date  . APPENDECTOMY  1982  . BREAST BIOPSY Right 05/16   axillary node  . CESAREAN SECTION    . DILATION AND CURETTAGE OF UTERUS     x2  . DILATION AND CURETTAGE, DIAGNOSTIC / THERAPEUTIC     x 2  . fatty tumor     on back  . HERNIA REPAIR    . PARATHYROIDECTOMY    . SKIN CANCER EXCISION     BCCA   . TOE SURGERY Left    joint replacement bit toe  . TUBAL LIGATION    . UMBILICAL HERNIA REPAIR      Social History Social  History  Substance Use Topics  . Smoking status: Former Smoker    Years: 5.00    Quit date: 05/09/1973  . Smokeless tobacco: Never Used     Comment: Quit at age 54  . Alcohol use No    Family History Family History  Problem Relation Age of Onset  . Breast cancer Mother 80  . Hypertension Mother   . Stroke Mother     Late 24  . Arthritis Mother   . Colon polyps Mother   . Hypertension Father   . Hyperlipidemia Father   . Diabetes Neg Hx   . Colon cancer Neg Hx   . Esophageal cancer Neg Hx   . Kidney disease Neg Hx   . Gallbladder disease Neg Hx     Allergies  Allergen Reactions  . Codeine Itching  . Penicillins Itching     REVIEW OF SYSTEMS (Negative unless  checked)  Constitutional: [] Weight loss  [] Fever  [] Chills Cardiac: [] Chest pain   [] Chest pressure   [] Palpitations   [] Shortness of breath when laying flat   [] Shortness of breath with exertion. Vascular:  [] Pain in legs with walking   [] Pain in legs at rest  [] History of DVT   [] Phlebitis   [x] Swelling in legs   [] Varicose veins   [] Non-healing ulcers Pulmonary:   [] Uses home oxygen   [] Productive cough   [] Hemoptysis   [] Wheeze  [] COPD   [] Asthma Neurologic:  [] Dizziness   [] Seizures   [] History of stroke   [] History of TIA  [] Aphasia   [] Vissual changes   [] Weakness or numbness in arm   [] Weakness or numbness in leg Musculoskeletal:   [] Joint swelling   [x] Joint pain   [] Low back pain Hematologic:  [] Easy bruising  [] Easy bleeding   [] Hypercoagulable state   [] Anemic Gastrointestinal:  [] Diarrhea   [] Vomiting  [] Gastroesophageal reflux/heartburn   [] Difficulty swallowing. Genitourinary:  [] Chronic kidney disease   [] Difficult urination  [] Frequent urination   [] Blood in urine Skin:  [] Rashes   [] Ulcers  Psychological:  [] History of anxiety   []  History of major depression.  Physical Examination  Vitals:   03/21/16 1038 03/21/16 1039  BP: 132/82 (!) 173/87  Pulse: 72   Resp: 17   Weight: 138 lb (62.6 kg)   Height: 5' 5.75" (1.67 m)    Body mass index is 22.44 kg/m. Gen: WD/WN, NAD Head: [place]/AT, No temporalis wasting.  Ear/Nose/Throat: Hearing grossly intact, nares w/o erythema or drainage, poor dentition Eyes: PER, EOMI, sclera nonicteric.  Neck: Supple, no masses.  No bruit or JVD.  Pulmonary:  Good air movement, clear to auscultation bilaterally, no use of accessory muscles.  Cardiac: RRR, normal S1, S2, no Murmurs. Vascular: Bilateral carotid bruits Vessel Right Left  Radial Palpable Palpable  Ulnar Palpable Palpable  Brachial Palpable Palpable  Carotid Palpable Palpable  Femoral Palpable Palpable  Popliteal Palpable Palpable  PT Palpable Palpable  DP Palpable Palpable    Gastrointestinal: soft, non-distended. No guarding/no peritoneal signs.  Musculoskeletal: M/S 5/5 throughout.  No deformity or atrophy.  Neurologic: CN 2-12 intact. Pain and light touch intact in extremities.  Symmetrical.  Speech is fluent. Motor exam as listed above. Psychiatric: Judgment intact, Mood & affect appropriate for pt's clinical situation. Dermatologic: No rashes or ulcers noted.  No changes consistent with cellulitis. Lymph : No Cervical lymphadenopathy, no lichenification or skin changes of chronic lymphedema.  CBC Lab Results  Component Value Date   WBC 5.3 08/28/2015   HGB 12.7 08/28/2015  HCT 36.9 08/28/2015   MCV 89.4 08/28/2015   PLT 292.0 08/28/2015    BMET    Component Value Date/Time   NA 136 12/16/2015 0949   K 4.6 12/16/2015 0949   CL 101 12/16/2015 0949   CO2 31 12/16/2015 0949   GLUCOSE 85 12/16/2015 0949   BUN 12 12/16/2015 0949   CREATININE 0.76 12/16/2015 0949   CALCIUM 9.4 12/16/2015 0949   CrCl cannot be calculated (Patient's most recent lab result is older than the maximum 21 days allowed.).  COAG No results found for: INR, PROTIME  Radiology No results found.  Assessment/Plan 1. Bilateral carotid artery stenosis Recommend:  Given the patient's asymptomatic subcritical stenosis no further invasive testing or surgery at this time.  Duplex ultrasound shows less than 60% stenosis bilaterally.  Continue antiplatelet therapy as prescribed Continue management of CAD, HTN and Hyperlipidemia Healthy heart diet,  encouraged exercise at least 4 times per week Follow up in 65months with duplex ultrasound and physical exam based on stable stenosis of the bilateral carotid artery   - VAS US CAROTID; Future  2. Renal artery stenosis (HCC) BP today was acceptable Given patient's arterial disease optimal control of the patient's hypertension is important.  The patient's BP and noninvasive studies support the renal artery stenosis is not  significantly increased when compared to the previous study.  No invasive studies or intervention is indicated at this time.  The patient will continue the current antihypertensive medications, no changes at this time.  The primary medical service will continue aggressive antihypertensive therapy as per the AHA guidelines   - VAS US RENAL ARTERY DUPLEX; Future  3. Fibromuscular dysplasia ([place]) See plan for #1 and #2 - VAS US CAROTID; Future - VAS US RENAL ARTERY DUPLEX; Future  4. Gastroesophageal reflux disease, esophagitis presence not specified Continue PPI  5. Arthritis Continue NSAID therapy    Hortencia Pilar, MD  03/21/2016 1:20 PM

## 2016-03-22 DIAGNOSIS — F4323 Adjustment disorder with mixed anxiety and depressed mood: Secondary | ICD-10-CM | POA: Diagnosis not present

## 2016-03-22 DIAGNOSIS — F411 Generalized anxiety disorder: Secondary | ICD-10-CM | POA: Diagnosis not present

## 2016-04-14 DIAGNOSIS — Z79899 Other long term (current) drug therapy: Secondary | ICD-10-CM | POA: Diagnosis not present

## 2016-04-18 DIAGNOSIS — F411 Generalized anxiety disorder: Secondary | ICD-10-CM | POA: Diagnosis not present

## 2016-04-18 DIAGNOSIS — G4700 Insomnia, unspecified: Secondary | ICD-10-CM | POA: Diagnosis not present

## 2016-04-21 ENCOUNTER — Encounter: Payer: Self-pay | Admitting: Internal Medicine

## 2016-04-21 ENCOUNTER — Ambulatory Visit (INDEPENDENT_AMBULATORY_CARE_PROVIDER_SITE_OTHER): Payer: Medicare Other | Admitting: Internal Medicine

## 2016-04-21 DIAGNOSIS — Z8371 Family history of colonic polyps: Secondary | ICD-10-CM

## 2016-04-21 DIAGNOSIS — M810 Age-related osteoporosis without current pathological fracture: Secondary | ICD-10-CM | POA: Diagnosis not present

## 2016-04-21 DIAGNOSIS — I701 Atherosclerosis of renal artery: Secondary | ICD-10-CM | POA: Diagnosis not present

## 2016-04-21 DIAGNOSIS — E210 Primary hyperparathyroidism: Secondary | ICD-10-CM | POA: Diagnosis not present

## 2016-04-21 DIAGNOSIS — F411 Generalized anxiety disorder: Secondary | ICD-10-CM

## 2016-04-21 DIAGNOSIS — I773 Arterial fibromuscular dysplasia: Secondary | ICD-10-CM

## 2016-04-21 DIAGNOSIS — G4700 Insomnia, unspecified: Secondary | ICD-10-CM

## 2016-04-21 DIAGNOSIS — K219 Gastro-esophageal reflux disease without esophagitis: Secondary | ICD-10-CM

## 2016-04-21 DIAGNOSIS — Z83719 Family history of colon polyps, unspecified: Secondary | ICD-10-CM

## 2016-04-21 NOTE — Progress Notes (Signed)
Patient ID: Victoria Moss, female   DOB: [birth year], 65 y.o.   MRN: [id number]   Subjective:    Patient ID: Victoria Moss, female    DOB: [birth year], 65 y.o.   MRN: [id number]  HPI  Patient here for a scheduled follow up.  She has been seeing Dr Nicolasa Ducking for anxiety.  Now on cymbalta.  Doing better on cymbalta.  Feels better.  Not requiring melatonin to help her sleep.  She is also seeing Dr Trudie Reed for her arthritis.  She is following her for RA and OA.  On plaquenil and meloxicam and appears to be tolerating this regimen.  Still with increased pain and stiffness, especially in her hands.  Cysts on her wrist.  Plans to discuss with rheumatology.  She is also seeing Dr Delana Meyer for fibromuscular dysplasia.  They are following her renal arteries and her carotid arteries.  Felt stable and recommended f/u in one year.  Just had labs through rheumatology.  Creatinine 1.06 initially.  Had f/u check and ok - .79.  Overall feeling better.  Eating. No chest pain.  No sob. Bowels stable.  Planning for colonoscopy 05/24/16.     Past Medical History:  Diagnosis Date  . Allergy   . Arthritis   . Basal cell cancer   . Diverticulosis    H/O  . GERD (gastroesophageal reflux disease)   . Heart murmur   . IBS (irritable bowel syndrome)   . Migraine    H/O, none since stopping chocolate  . Osteoporosis   . Rheumatic fever    H/O  . Status post dilation of esophageal narrowing    Past Surgical History:  Procedure Laterality Date  . APPENDECTOMY  1982  . BREAST BIOPSY Right 05/16   axillary node  . CESAREAN SECTION    . DILATION AND CURETTAGE OF UTERUS     x2  . DILATION AND CURETTAGE, DIAGNOSTIC / THERAPEUTIC     x 2  . fatty tumor     on back  . HERNIA REPAIR    . PARATHYROIDECTOMY    . SKIN CANCER EXCISION     [facility]   . TOE SURGERY Left    joint replacement bit toe  . TUBAL LIGATION    . UMBILICAL HERNIA REPAIR     Family History  Problem Relation Age of Onset  . Breast  cancer Mother 55  . Hypertension Mother   . Stroke Mother     Late 12  . Arthritis Mother   . Colon polyps Mother   . Hypertension Father   . Hyperlipidemia Father   . Diabetes Neg Hx   . Colon cancer Neg Hx   . Esophageal cancer Neg Hx   . Kidney disease Neg Hx   . Gallbladder disease Neg Hx    Social History   Social History  . Marital status: Widowed    Spouse name: N/A  . Number of children: 2  . Years of education: N/A   Occupational History  . Retired Other   Social History Main Topics  . Smoking status: Former Smoker    Years: 5.00    Quit date: 05/09/1973  . Smokeless tobacco: Never Used     Comment: Quit at age 67  . Alcohol use No  . Drug use: No  . Sexual activity: No   Other Topics Concern  . None   Social History Narrative  . None    Outpatient Encounter Prescriptions as of 04/21/2016  Medication Sig  .  aspirin 81 MG tablet Take 81 mg by mouth daily.  . Calcium Carb-Cholecalciferol (CALCIUM 600+D3 PO) Take by mouth.  . Calcium Carbonate (CALTRATE 600 PO) Take 1 tablet by mouth daily.   . cyclobenzaprine (FLEXERIL) 5 MG tablet Take 1 tablet (5 mg total) by mouth 3 (three) times daily as needed for muscle spasms.  Marland Kitchen dicyclomine (BENTYL) 10 MG capsule One capsule q day prn  . DULoxetine (CYMBALTA) 30 MG capsule Take 40 mg by mouth daily.   . Estradiol (VAGIFEM) 10 MCG TABS vaginal tablet Place 1 tab intravaginally qhs x 1 week, then 2 x a week at hs  . hydroxychloroquine (PLAQUENIL) 200 MG tablet Take 200 mg by mouth daily.  . meloxicam (MOBIC) 7.5 MG tablet Take 7.5 mg by mouth daily.  . Multiple Vitamins-Minerals (ALIVE WOMENS 50+ PO) Take 1 tablet by mouth daily.   . Omega-3 Fatty Acids (FISH OIL PO) Take 1 tablet by mouth daily.   Marland Kitchen omeprazole (PRILOSEC) 20 MG capsule Take 1 capsule (20 mg total) by mouth daily.  . Turmeric 500 MG CAPS Take by mouth daily.  . Vitamin D, Cholecalciferol, 400 UNITS CAPS Take 1 tablet by mouth daily.    No  facility-administered encounter medications on file as of 04/21/2016.     Review of Systems  Constitutional: Negative for appetite change and unexpected weight change.  HENT: Negative for congestion and sinus pressure.   Respiratory: Negative for cough, chest tightness and shortness of breath.   Cardiovascular: Negative for chest pain, palpitations and leg swelling.  Gastrointestinal: Negative for abdominal pain, diarrhea, nausea and vomiting.  Genitourinary: Negative for difficulty urinating and dysuria.  Musculoskeletal: Negative for myalgias.       Still with joint stiffness.    Skin: Negative for color change and rash.  Neurological: Negative for dizziness, light-headedness and headaches.  Psychiatric/Behavioral: Negative for agitation and dysphoric mood.       Doing better on cymbalta.  Seeing Dr Nicolasa Ducking.         Objective:    Physical Exam  Constitutional: She appears well-developed and well-nourished. No distress.  HENT:  Nose: Nose normal.  Mouth/Throat: Oropharynx is clear and moist.  Neck: Neck supple. No thyromegaly present.  Cardiovascular: Normal rate and regular rhythm.   Pulmonary/Chest: Breath sounds normal. No respiratory distress. She has no wheezes.  Abdominal: Soft. Bowel sounds are normal. There is no tenderness.  Musculoskeletal: She exhibits no edema or tenderness.  Questionable cysts on her wrist.  No pain to palpation.  No increased erythema.   Lymphadenopathy:    She has no cervical adenopathy.  Skin: No rash noted. No erythema.  Psychiatric: She has a normal mood and affect. Her behavior is normal.    BP 132/90   Pulse 66   Wt 141 lb (64 kg)   SpO2 98%   BMI 22.93 kg/m  Wt Readings from Last 3 Encounters:  04/21/16 141 lb (64 kg)  03/21/16 138 lb (62.6 kg)  01/25/16 137 lb 12.8 oz (62.5 kg)     Lab Results  Component Value Date   WBC 5.3 08/28/2015   HGB 12.7 08/28/2015   HCT 36.9 08/28/2015   PLT 292.0 08/28/2015   GLUCOSE 85  12/16/2015   CHOL 155 12/16/2015   TRIG 160.0 (H) 12/16/2015   HDL 55.00 12/16/2015   LDLCALC 68 12/16/2015   ALT 30 12/16/2015   AST 30 12/16/2015   NA 136 12/16/2015   K 4.6 12/16/2015   CL 101 12/16/2015  CREATININE 0.76 12/16/2015   BUN 12 12/16/2015   CO2 31 12/16/2015   TSH 0.91 08/28/2015   HGBA1C 5.6 12/31/2014    Mr Cervical Spine Wo Contrast  Result Date: 01/07/2016 CLINICAL DATA:  Severe neck pain which began 6 weeks ago when the patient woke up. No known injury. Initial encounter. EXAM: MRI CERVICAL SPINE WITHOUT CONTRAST TECHNIQUE: Multiplanar, multisequence MR imaging of the cervical spine was performed. No intravenous contrast was administered. COMPARISON:  None. FINDINGS: Alignment: Maintained. Vertebrae: Height and signal are unremarkable. Cord: Normal signal throughout. Posterior Fossa, vertebral arteries, paraspinal tissues: Unremarkable. Disc levels: C2-3:  Negative. C3-4: Mild uncovertebral spurring on the left. The central canal and foramina are widely patent. C4-5:  Negative. C5-6: Shallow disc bulge is identified there is some uncovertebral disease. The central canal is widely patent. Mild to moderate foraminal narrowing appears worse on the right. C6-7: Shallow disc bulge without central canal or foraminal stenosis. C7-T1:  Negative. IMPRESSION: Mild spondylosis most notable at C5-6 where uncovertebral disease causes mild to moderate foraminal narrowing, worse on the right. The central canal is widely patent at all levels. Electronically Signed   By: Inge Rise M.D.   On: 01/07/2016 14:53       Assessment & Plan:   Problem List Items Addressed This Visit    Family history of colonic polyps    Last colonoscopy 05/27/09 as outlined.  Scheduled for f/u colonoscopy 05/24/16.        Fibromuscular dysplasia ([place])    Evaluated by [facility] 03/21/16.  Recommended f/u carotid ultrasound in one year.        Generalized anxiety disorder    Increased stress previously.   Seeing Dr Nicolasa Ducking.  On cymbalta and doing better.  Sleeping better.  Follow.       GERD (gastroesophageal reflux disease)    EGD as outlined.  On omeprazole.  Controlled.        Insomnia    On cymbalta.  Sleeping better.  Follow.        Osteoporosis    Receiving reclast.        Primary hyperparathyroidism [facility])    Seeing endocrinology.  Stable.        Renal artery stenosis ([place])    Followed by [facility].  Saw Dr Lucky Cowboy 03/21/16 - stable.  Continue f/u as planned.         I spent 25 minutes with the patient and more than 50% of the time was spent in consultation regarding the above.  Time spent reviewing over her recent visits and diagnosis and current treatment plan.     Einar Pheasant, MD

## 2016-04-24 ENCOUNTER — Encounter: Payer: Self-pay | Admitting: Internal Medicine

## 2016-04-24 NOTE — Assessment & Plan Note (Signed)
Receiving reclast.  °

## 2016-04-24 NOTE — Assessment & Plan Note (Signed)
Evaluated by [facility] (03/21/16).  Recommended f/u carotid ultrasound in one year.   

## 2016-04-24 NOTE — Assessment & Plan Note (Addendum)
Increased stress previously.  Seeing Dr Nicolasa Ducking.  On cymbalta and doing better.  Sleeping better.  Follow.

## 2016-04-24 NOTE — Assessment & Plan Note (Signed)
EGD as outlined.  On omeprazole.  Controlled.   

## 2016-04-24 NOTE — Assessment & Plan Note (Signed)
Followed by [facility].  Saw Dr Lucky Cowboy 03/21/16 - stable.  Continue f/u as planned.

## 2016-04-24 NOTE — Assessment & Plan Note (Signed)
On cymbalta.  Sleeping better.  Follow.

## 2016-04-24 NOTE — Assessment & Plan Note (Signed)
Last colonoscopy 05/27/09 as outlined.  Scheduled for f/u colonoscopy 05/24/16.

## 2016-04-24 NOTE — Assessment & Plan Note (Signed)
Seeing endocrinology.  Stable.   

## 2016-05-09 HISTORY — PX: COLONOSCOPY: SHX174

## 2016-05-20 DIAGNOSIS — D2261 Melanocytic nevi of right upper limb, including shoulder: Secondary | ICD-10-CM | POA: Diagnosis not present

## 2016-05-20 DIAGNOSIS — D2272 Melanocytic nevi of left lower limb, including hip: Secondary | ICD-10-CM | POA: Diagnosis not present

## 2016-05-20 DIAGNOSIS — D225 Melanocytic nevi of trunk: Secondary | ICD-10-CM | POA: Diagnosis not present

## 2016-05-20 DIAGNOSIS — Z85828 Personal history of other malignant neoplasm of skin: Secondary | ICD-10-CM | POA: Diagnosis not present

## 2016-05-24 ENCOUNTER — Encounter: Payer: Self-pay | Admitting: Unknown Physician Specialty

## 2016-05-24 DIAGNOSIS — Z1211 Encounter for screening for malignant neoplasm of colon: Secondary | ICD-10-CM | POA: Diagnosis not present

## 2016-05-24 DIAGNOSIS — K648 Other hemorrhoids: Secondary | ICD-10-CM | POA: Diagnosis not present

## 2016-05-24 DIAGNOSIS — K640 First degree hemorrhoids: Secondary | ICD-10-CM | POA: Diagnosis not present

## 2016-05-24 DIAGNOSIS — Z8371 Family history of colonic polyps: Secondary | ICD-10-CM | POA: Diagnosis not present

## 2016-06-03 DIAGNOSIS — G4700 Insomnia, unspecified: Secondary | ICD-10-CM | POA: Diagnosis not present

## 2016-06-03 DIAGNOSIS — F411 Generalized anxiety disorder: Secondary | ICD-10-CM | POA: Diagnosis not present

## 2016-06-04 ENCOUNTER — Other Ambulatory Visit: Payer: Self-pay | Admitting: Internal Medicine

## 2016-06-07 DIAGNOSIS — E041 Nontoxic single thyroid nodule: Secondary | ICD-10-CM | POA: Diagnosis not present

## 2016-06-07 DIAGNOSIS — M810 Age-related osteoporosis without current pathological fracture: Secondary | ICD-10-CM | POA: Diagnosis not present

## 2016-06-07 DIAGNOSIS — E559 Vitamin D deficiency, unspecified: Secondary | ICD-10-CM | POA: Diagnosis not present

## 2016-06-07 DIAGNOSIS — E892 Postprocedural hypoparathyroidism: Secondary | ICD-10-CM | POA: Diagnosis not present

## 2016-06-07 DIAGNOSIS — Z8639 Personal history of other endocrine, nutritional and metabolic disease: Secondary | ICD-10-CM | POA: Diagnosis not present

## 2016-06-10 ENCOUNTER — Telehealth: Payer: Self-pay | Admitting: Internal Medicine

## 2016-06-10 NOTE — Telephone Encounter (Signed)
Patient Name: Victoria Moss DOB: Jun 20, 1950 Initial Comment [name] states she's confussion, headaches, head pressure, weakness. Nurse Assessment Nurse: Robby Sermon, RN, April Date/Time Eilene Ghazi Time): 06/10/2016 1:26:38 PM Confirm and document reason for call. If symptomatic, describe symptoms. ---[name] states she has had episodes like this for the past 10 to 15 years but not often. She wakes up and can not think clearly, a headache/ throbbing in the back, and she feels exhausted. these episodes happen once or twice a year and she has no idea what triggers them. Current blood pressure is 144/83 after being high when she was upset. Does the patient have any new or worsening symptoms? ---Yes Will a triage be completed? ---Yes Related visit to physician within the last 2 weeks? ---Yes Does the PT have any chronic conditions? (i.e. diabetes, asthma, etc.) ---No Is this a behavioral health or substance abuse call? ---No Guidelines Guideline Title Affirmed Question Affirmed Notes Final Disposition User Clinical Call Hartrandt, RN, April Comments [name] answered to no on all confusion questions. She states she will speak with her doctor about symptoms.

## 2016-06-10 NOTE — Telephone Encounter (Signed)
Patient stated team health advised patient if this kept happening to call the office, patient stated she felt confused, weak disoriented and then nausea, advised patient needs to be seen she should go to the ER. Patient agreed.FYI

## 2016-06-10 NOTE — Telephone Encounter (Signed)
FYI

## 2016-06-10 NOTE — Telephone Encounter (Signed)
See phone note patient advised of disorientation and of confusion, with weakness and nausea. Patient stated she would go to ED.

## 2016-06-10 NOTE — Telephone Encounter (Signed)
Pt called stated that she has not felt right today. She is complaining of weakness, memory loss, confusion, fatigued, nausea, and headache. Sent call to [facility] triage.  Call pt @ [phone]

## 2016-06-22 DIAGNOSIS — M150 Primary generalized (osteo)arthritis: Secondary | ICD-10-CM | POA: Diagnosis not present

## 2016-06-22 DIAGNOSIS — Z6823 Body mass index (BMI) 23.0-23.9, adult: Secondary | ICD-10-CM | POA: Diagnosis not present

## 2016-06-22 DIAGNOSIS — M2550 Pain in unspecified joint: Secondary | ICD-10-CM | POA: Diagnosis not present

## 2016-06-22 DIAGNOSIS — M0609 Rheumatoid arthritis without rheumatoid factor, multiple sites: Secondary | ICD-10-CM | POA: Diagnosis not present

## 2016-06-22 DIAGNOSIS — Z79899 Other long term (current) drug therapy: Secondary | ICD-10-CM | POA: Diagnosis not present

## 2016-06-22 DIAGNOSIS — R5383 Other fatigue: Secondary | ICD-10-CM | POA: Diagnosis not present

## 2016-06-24 ENCOUNTER — Encounter: Payer: Self-pay | Admitting: Family Medicine

## 2016-06-24 ENCOUNTER — Ambulatory Visit (INDEPENDENT_AMBULATORY_CARE_PROVIDER_SITE_OTHER): Payer: Medicare Other | Admitting: Family Medicine

## 2016-06-24 VITALS — BP 140/92 | HR 68 | Temp 98.8°F | Wt 143.0 lb

## 2016-06-24 DIAGNOSIS — M67432 Ganglion, left wrist: Secondary | ICD-10-CM

## 2016-06-24 DIAGNOSIS — R030 Elevated blood-pressure reading, without diagnosis of hypertension: Secondary | ICD-10-CM | POA: Diagnosis not present

## 2016-06-24 DIAGNOSIS — M67431 Ganglion, right wrist: Secondary | ICD-10-CM | POA: Diagnosis not present

## 2016-06-24 NOTE — Progress Notes (Signed)
  Tommi Rumps, MD Phone: [phone]  Victoria Moss is a 66 y.o. female who presents today for same-day visit.  Patient notes cysts on both wrists. The left wrist has a cyst on the volar radial aspect for the last 2-3 months. The right wrist has a possible cyst on the dorsal ulnar aspect for several months. She saw her rheumatologist and they advised it was fluid collections. She does have a history of osteoarthritis and possibly an inflammatory arthritis. She's been on Plaquenil and meloxicam with little benefit. They're going to start her on an injectable therapy as well. She occasionally notes numbness and tingling in the radial aspect of her right hand occasionally coming down from her right elbow. Had some neck discomfort last year though none now. No numbness elsewhere body. No weakness in her body. Does not check her blood pressure. No history of hypertension.  ROS see history of present illness  Objective  Physical Exam Vitals:   06/24/16 1001  BP: (!) 140/92  Pulse: 68  Temp: 98.8 F (37.1 C)    BP Readings from Last 3 Encounters:  06/24/16 (!) 140/92  04/21/16 132/90  03/21/16 (!) 173/87   Wt Readings from Last 3 Encounters:  06/24/16 143 lb (64.9 kg)  04/21/16 141 lb (64 kg)  03/21/16 138 lb (62.6 kg)    Physical Exam  Constitutional: No distress.  Cardiovascular: Normal rate, regular rhythm and normal heart sounds.   Pulmonary/Chest: Effort normal and breath sounds normal.  Musculoskeletal:  Left volar radial aspect wrist with small apparent cyst that is mildly tender with no warmth or erythema, right dorsal ulnar aspect wrist with possible larger cyst that is nontender, negative Tinel's bilateral wrists, bilateral hands are warm and well perfused  Neurological: She is alert.  CN 2-12 intact, 5/5 strength in bilateral biceps, triceps, grip, quads, hamstrings, plantar and dorsiflexion, sensation to light touch intact in bilateral UE and LE, normal gait    Skin: She is not diaphoretic.     Assessment/Plan: Please see individual problem list.  Ganglion cyst of both wrists Patient with possible ganglion cysts of both wrists. Could potentially also be fluid collections from her arthritis. She is neurovascularly intact. We'll refer to sports medicine for ultrasound and further management. I did discuss the radial aspect numbness and tingling that she gets in her right hand. This could be related to her elbow or potentially at her neck. She is neurologically intact. Offered referral to neurology versus seeing sports medicine to see what they thought. She opted for sports medicine. She did have an MRI of her neck that revealed C5-C6 foraminal narrowing worse on the right side.  Elevated blood pressure reading Slightly elevated today. Was normal last week with endocrinology. Discussed monitoring this daily at home and following up with her PCP to determine if she needs medication for this. Advised if persistently greater than 140/90 she should let us know.   Orders Placed This Encounter  Procedures  . Ambulatory referral to Sports Medicine    Referral Priority:   Routine    Referral Type:   Consultation    Number of Visits Requested:   [place], MD [place]

## 2016-06-24 NOTE — Progress Notes (Signed)
Pre visit review using our clinic review tool, if applicable. No additional management support is needed unless otherwise documented below in the visit note. 

## 2016-06-24 NOTE — Assessment & Plan Note (Signed)
Slightly elevated today. Was normal last week with endocrinology. Discussed monitoring this daily at home and following up with her PCP to determine if she needs medication for this. Advised if persistently greater than 140/90 she should let us know.

## 2016-06-24 NOTE — Patient Instructions (Signed)
Nice to see you. We will get urine for sports medicine for your cysts. I recommend follow-up with Dr. Nicki Reaper for your blood pressure. He should start monitoring her blood pressure at home as well. If you develop persistent numbness or weakness please seek medical attention.

## 2016-06-24 NOTE — Assessment & Plan Note (Addendum)
Patient with possible ganglion cysts of both wrists. Could potentially also be fluid collections from her arthritis. She is neurovascularly intact. We'll refer to sports medicine for ultrasound and further management. I did discuss the radial aspect numbness and tingling that she gets in her right hand. This could be related to her elbow or potentially at her neck. She is neurologically intact. Offered referral to neurology versus seeing sports medicine to see what they thought. She opted for sports medicine. She did have an MRI of her neck that revealed C5-C6 foraminal narrowing worse on the right side.

## 2016-07-04 ENCOUNTER — Encounter: Payer: Self-pay | Admitting: Sports Medicine

## 2016-07-04 ENCOUNTER — Ambulatory Visit: Payer: Self-pay

## 2016-07-04 ENCOUNTER — Ambulatory Visit (INDEPENDENT_AMBULATORY_CARE_PROVIDER_SITE_OTHER): Payer: Medicare Other | Admitting: Sports Medicine

## 2016-07-04 VITALS — BP 118/80 | HR 79 | Ht 65.75 in | Wt 141.6 lb

## 2016-07-04 DIAGNOSIS — M1990 Unspecified osteoarthritis, unspecified site: Secondary | ICD-10-CM

## 2016-07-04 DIAGNOSIS — M67431 Ganglion, right wrist: Secondary | ICD-10-CM | POA: Diagnosis not present

## 2016-07-04 DIAGNOSIS — M67432 Ganglion, left wrist: Secondary | ICD-10-CM | POA: Diagnosis not present

## 2016-07-04 DIAGNOSIS — I773 Arterial fibromuscular dysplasia: Secondary | ICD-10-CM | POA: Diagnosis not present

## 2016-07-04 DIAGNOSIS — R2233 Localized swelling, mass and lump, upper limb, bilateral: Secondary | ICD-10-CM

## 2016-07-04 NOTE — Patient Instructions (Addendum)
It was great to meet you.  Please follow-up with Dr. Trudie Reed regarding medication changes.  I am happy to see you back at any time if you would like an injection in your wrist or hands.  Keep remaining active and let us know if we can help you in the future.

## 2016-07-04 NOTE — Progress Notes (Signed)
Victoria Moss - 66 y.o. female MRN [id number]  Date of birth: Oct 21, 1950  Office Visit Note: Visit Date: 07/04/2016 PCP: Einar Pheasant, MD Referred by: Leone Haven, MD  Subjective: Chief Complaint  Patient presents with  . mass on both wrists    cyst vs inflammation   HPI: Patient referred by Dr. Caryl Bis for second opinion of cystic changes of bilateral wrists.  She has been followed for several years by Dr. Trudie Reed with [facility] rheumatology and has been on Plaquenil and meloxicam for several years but has had worsening of her symptoms past several months.  She has discussed changing to a new antirheumatic medication but is hesitant due to the reported side effects.  She reports having poorly delineated inflammatory arthropathy with most recent CRP reported as 7 per the patient.  She is RF negative.  She has noticed that the right distal ulna has had worsening swelling and discomfort especially while laying her hand down on the side over the past several months.  The pain is not severe enough today to warrant or request an injection.  She has chronic issues with pain in her CMC's.  She is also noted a cystic structure that is formed on the palmar aspect of her left palmar crease.  No known trauma.  She denies any fevers or chills. ROS:   Otherwise per HPI.  Objective:  VS:  HT:5' 5.75" (167 cm)   WT:141 lb 9.6 oz (64.2 kg)  BMI:23.1    BP:118/80  HR:79bpm  TEMP: ( )  RESP:97 % Physical Exam: GENERAL:  WDWN, NAD, Non-toxic appearing PSYCH:  Alert & appropriately interactive  Not depressed or anxious appearing Bilateral wrists: Significant bossing and cystic changes of the right distal ulna, right CMC, DIP joints most notably on the right fifth finger.  Left wrist has generalized swelling as well with a palpable effusion.  She does have a cystic structure as outlined above that is minimally tender.  Sensation is intact to light touch in bilateral upper  extremities.  Bilateral radial pulses in all native carpal tunnel compression test, Tinel's but patient is unable to do Phalen's test due to lack of mobility of the right wrist.  Imaging & Procedures: No results found. LIMITED MSK ULTRASOUND OF BILATERAL UPPER EXTREMITIES Images were obtained and interpreted by myself, Teresa Coombs, DO  Images have been saved and stored to PACS system. Images obtained on: GE S7 Ultrasound machine  FINDINGS:  Multiple cystic changes with significant erosive changes of the distal with a moth-eaten appearance.  Moderate effusion of the bilateral wrists joints right worse than left.  Significant osteophytic spurring and destruction of the [facility] on the right compared to the left.  Autofusion of the DIP of the fifth finger  No significant synovitis  Cystic change surrounding the left flexor carpi ulnaris consistent with a ganglion cyst.  IMPRESSION: 1. 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      Assessment & Plan: Problem List Items Addressed This Visit    Inflammatory arthritis    Symptoms are consistent with an inflammatory arthropathy.   >50% of this 30 minute visit spent in direct patient counseling and/or coordination of care.  Significant time was spent in discussion with the patient regarding catheter etiology of her underlying medical condition and the importance of addressing her underlying inflammatory condition.  Although not well delineated I do agree that a change in therapy to a newer biologic  is likely the most prudent thing to do at this time and will defer to Dr. Trudie Reed.  We did discuss the options for injection therapy and I am defer this until she becomes more focally symptomatic.   I will plan to follow-up with her on an as-needed basis only.         Fibromuscular dysplasia ([facility])    I am unclear if this could be related to her underlying  inflammatory arthropathy however concern for an underlying that is associated with inflammatory arthritis could be considered.      Ganglion cyst of both wrists    Secondary to her underlying inflammatory arthropathy synovial as well as peritendinous.  Recommend continue with bracing, topical anti-inflammatories and treatment of her inflammatory arthropathy.       Other Visit Diagnoses    Wrist lump, bilateral    -  Primary   Relevant Orders   [place] LIMITED JOINT SPACE STRUCTURES UP BILAT      Follow-up: Return if symptoms worsen or fail to improve.   Past Medical/Family/Surgical/Social History: Medications & Allergies reviewed per EMR Patient Active Problem List   Diagnosis Date Noted  . Ganglion cyst of both wrists 06/24/2016  . Elevated blood pressure reading 06/24/2016  . Carotid stenosis 03/21/2016  . Renal artery stenosis ([place]) 03/21/2016  . Generalized anxiety disorder 12/17/2015  . Neck pain 11/27/2015  . Dysuria 09/26/2015  . Abnormal mammogram 02/09/2015  . Primary hyperparathyroidism ([place]) 02/06/2015  . Fibromuscular dysplasia ([place]) 12/03/2014  . Insomnia 10/27/2014  . Health care maintenance 08/10/2014  . Left hip pain 11/13/2013  . Family history of colonic polyps 07/02/2013  . Inflammatory arthritis 12/23/2012  . IBS (irritable bowel syndrome) 12/23/2012  . Diverticulosis 12/23/2012  . GERD (gastroesophageal reflux disease) 12/23/2012  . Environmental allergies 12/23/2012  . Migraine 12/23/2012  . Osteoporosis    Past Medical History:  Diagnosis Date  . Allergy   . Arthritis   . Basal cell cancer   . Diverticulosis    H/O  . GERD (gastroesophageal reflux disease)   . Heart murmur   . IBS (irritable bowel syndrome)   . Migraine    H/O, none since stopping chocolate  . Osteoporosis   . Rheumatic fever    H/O  . Status post dilation of esophageal narrowing    Family History  Problem Relation Age of Onset  . Breast cancer Mother 52  . Hypertension  Mother   . Stroke Mother     Late 10  . Arthritis Mother   . Colon polyps Mother   . Hypertension Father   . Hyperlipidemia Father   . Diabetes Neg Hx   . Colon cancer Neg Hx   . Esophageal cancer Neg Hx   . Kidney disease Neg Hx   . Gallbladder disease Neg Hx    Past Surgical History:  Procedure Laterality Date  . APPENDECTOMY  1982  . BREAST BIOPSY Right 05/16   axillary node  . CESAREAN SECTION    . DILATION AND CURETTAGE OF UTERUS     x2  . DILATION AND CURETTAGE, DIAGNOSTIC / THERAPEUTIC     x 2  . fatty tumor     on back  . HERNIA REPAIR    . PARATHYROIDECTOMY    . SKIN CANCER EXCISION     [facility]   . TOE SURGERY Left    joint replacement bit toe  . TUBAL LIGATION    . [place]  History   Occupational History  . Retired Other   Social History Main Topics  . Smoking status: Former Smoker    Years: 5.00    Quit date: 05/09/1973  . Smokeless tobacco: Never Used     Comment: Quit at age 89  . Alcohol use No  . Drug use: No  . Sexual activity: No

## 2016-07-05 NOTE — Assessment & Plan Note (Signed)
I am unclear if this could be related to her underlying inflammatory arthropathy however concern for an underlying that is associated with inflammatory arthritis could be considered.

## 2016-07-05 NOTE — Assessment & Plan Note (Signed)
Symptoms are consistent with an inflammatory arthropathy.   >50% of this 30 minute visit spent in direct patient counseling and/or coordination of care.  Significant time was spent in discussion with the patient regarding catheter etiology of her underlying medical condition and the importance of addressing her underlying inflammatory condition.  Although not well delineated I do agree that a change in therapy to a newer biologic is likely the most prudent thing to do at this time and will defer to Dr. Trudie Reed.  We did discuss the options for injection therapy and I am defer this until she becomes more focally symptomatic.   I will plan to follow-up with her on an as-needed basis only.

## 2016-07-05 NOTE — Assessment & Plan Note (Signed)
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  Recommend continue with bracing, topical anti-inflammatories and treatment of her inflammatory arthropathy.

## 2016-07-06 DIAGNOSIS — M0609 Rheumatoid arthritis without rheumatoid factor, multiple sites: Secondary | ICD-10-CM | POA: Diagnosis not present

## 2016-07-11 DIAGNOSIS — E041 Nontoxic single thyroid nodule: Secondary | ICD-10-CM | POA: Diagnosis not present

## 2016-07-20 ENCOUNTER — Other Ambulatory Visit: Payer: Self-pay | Admitting: Internal Medicine

## 2016-07-20 DIAGNOSIS — Z1231 Encounter for screening mammogram for malignant neoplasm of breast: Secondary | ICD-10-CM

## 2016-07-27 ENCOUNTER — Ambulatory Visit (INDEPENDENT_AMBULATORY_CARE_PROVIDER_SITE_OTHER): Payer: Medicare Other

## 2016-07-27 ENCOUNTER — Encounter: Payer: Self-pay | Admitting: Podiatry

## 2016-07-27 ENCOUNTER — Ambulatory Visit (INDEPENDENT_AMBULATORY_CARE_PROVIDER_SITE_OTHER): Payer: Medicare Other | Admitting: Podiatry

## 2016-07-27 DIAGNOSIS — M722 Plantar fascial fibromatosis: Secondary | ICD-10-CM

## 2016-07-27 DIAGNOSIS — M205X1 Other deformities of toe(s) (acquired), right foot: Secondary | ICD-10-CM

## 2016-07-27 NOTE — Patient Instructions (Signed)

## 2016-07-27 NOTE — Progress Notes (Signed)
She presents today with a chief concern of pain to the right heel and some tenderness to the first metatarsophalangeal joint of the right foot. She states that him having pain in the heel and I think I have a bone spur him a big toe joint right foot. She relates to me today that she is being treated by Dr. Lenna Gilford rheumatology and is currently taking plaquinil and a biological drug. She would like to consider surgery to the first metatarsophalangeal joint of the right foot.   objective: Vital signs are stable alert and oriented 3. Pulses are palpable area reviewed her past [place] history medications and allergies. She has limited range of motion of the first metatarsophalangeal joint with nonpulsatile mass indicative of hallux limitus and spurring. She also has pain on palpation medial calcaneal tubercle of the right heel. Radial dressing today do demonstrate moderate hallux rigidus with dorsal spurringofsclerosisandmalalignmentofthejoint.Shealsohasasofttissueincreaseindensityattheplantarfasciainsertionsiteoftherightheel.  Assessment:Plantarfasciitisright. 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  Plan: Injected the right heel today with Kenalog and local anesthetic. Placed her in a plantar fascia brace and a night splint. We discussed the need for surgical intervention which she would like to consider in the fall or winter time for the first metatarsophalangeal joint. She will discuss this with her rheumatologist prior to that time. Follow up with her in 1 month

## 2016-08-03 DIAGNOSIS — M0609 Rheumatoid arthritis without rheumatoid factor, multiple sites: Secondary | ICD-10-CM | POA: Diagnosis not present

## 2016-08-11 ENCOUNTER — Encounter: Payer: Self-pay | Admitting: Family Medicine

## 2016-08-11 ENCOUNTER — Ambulatory Visit (INDEPENDENT_AMBULATORY_CARE_PROVIDER_SITE_OTHER): Payer: Medicare Other

## 2016-08-11 ENCOUNTER — Ambulatory Visit (INDEPENDENT_AMBULATORY_CARE_PROVIDER_SITE_OTHER): Payer: Medicare Other | Admitting: Family Medicine

## 2016-08-11 VITALS — BP 151/93 | HR 65 | Temp 97.9°F | Wt 140.0 lb

## 2016-08-11 DIAGNOSIS — S6991XA Unspecified injury of right wrist, hand and finger(s), initial encounter: Secondary | ICD-10-CM

## 2016-08-11 DIAGNOSIS — S52614A Nondisplaced fracture of right ulna styloid process, initial encounter for closed fracture: Secondary | ICD-10-CM

## 2016-08-11 NOTE — Patient Instructions (Signed)
We will call as soon as possible with the xray.  Take care  Dr. Lacinda Axon

## 2016-08-11 NOTE — Assessment & Plan Note (Signed)
New problem. Patient with a [place] injury. X-ray obtained and revealed a fracture the ulnar styloid in addition to degenerative changes. Advised Ice and use of her wrist brace. Sending her to orthopedics.

## 2016-08-11 NOTE — Progress Notes (Signed)
Subjective:  Patient ID: Victoria Moss, female    DOB: 03-25-1951  Age: 66 y.o. MRN: [id number]  CC: Fall, wrist/hand injury  HPI:  66 year old female presents with the above complaint.  Patient states that she was outside gardening yesterday and tripped over some rocks. She fell on her outstretched right hand. She subsequently injured her wrist. She's had moderate to severe pain and swelling of her wrist.  Decreased ROM. No associated numbness or tingling. She's been icing the area and has taken some Tylenol in addition to her normal medications with some improvement. No other associated symptoms. No other complaints at this time.  Social Hx   Social History   Social History  . Marital status: Widowed    Spouse name: N/A  . Number of children: 2  . Years of education: N/A   Occupational History  . Retired Other   Social History Main Topics  . Smoking status: Former Smoker    Years: 5.00    Quit date: 05/09/1973  . Smokeless tobacco: Never Used     Comment: Quit at age 43  . Alcohol use No  . Drug use: No  . Sexual activity: No   Other Topics Concern  . None   Social History Narrative  . None    Review of Systems  Constitutional: Negative.   Musculoskeletal:       Wrist pain/swelling.   Objective:  BP (!) 151/93   Pulse 65   Temp 97.9 F (36.6 C) (Oral)   Wt 140 lb (63.5 kg)   SpO2 100%   BMI 22.77 kg/m   BP/Weight 08/11/2016 07/04/2016 6/43/3295  Systolic BP 188 416 606  Diastolic BP 93 80 92  Wt. (Lbs) 140 141.6 143  BMI 22.77 23.03 23.26    Physical Exam  Constitutional: She is oriented to person, place, and time. She appears well-developed. No distress.  Pulmonary/Chest: Effort normal.  Musculoskeletal:  Wrist: Right Inspection - visible swelling on the ulnar and radial sides. ROM decreased in all planes. Palpation - tenderness at the snuffbox, distal radius, and distal ulna.   Neurological: She is alert and oriented to person, place,  and time.  Psychiatric: She has a normal mood and affect.  Vitals reviewed.   Lab Results  Component Value Date   WBC 5.3 08/28/2015   HGB 12.7 08/28/2015   HCT 36.9 08/28/2015   PLT 292.0 08/28/2015   GLUCOSE 85 12/16/2015   CHOL 155 12/16/2015   TRIG 160.0 (H) 12/16/2015   HDL 55.00 12/16/2015   LDLCALC 68 12/16/2015   ALT 30 12/16/2015   AST 30 12/16/2015   NA 136 12/16/2015   K 4.6 12/16/2015   CL 101 12/16/2015   CREATININE 0.76 12/16/2015   BUN 12 12/16/2015   CO2 31 12/16/2015   TSH 0.91 08/28/2015   HGBA1C 5.6 12/31/2014    Assessment & Plan:   Problem List Items Addressed This Visit    Closed nondisplaced fracture of styloid process of right ulna - Primary    New problem. Patient with a [place] injury. X-ray obtained and revealed a fracture the ulnar styloid in addition to degenerative changes. Advised Ice and use of her wrist brace. Sending her to orthopedics.      Relevant Orders   Ambulatory referral to Orthopedic Surgery    Other Visit Diagnoses    Injury of right wrist, initial encounter       Relevant Orders   DG Wrist Complete Right (Completed)   Ambulatory  referral to Orthopedic Surgery     Follow-up: PRN  [place]

## 2016-08-11 NOTE — Progress Notes (Signed)
Pre visit review using our clinic review tool, if applicable. No additional management support is needed unless otherwise documented below in the visit note. 

## 2016-08-12 DIAGNOSIS — S63521A Sprain of radiocarpal joint of right wrist, initial encounter: Secondary | ICD-10-CM | POA: Diagnosis not present

## 2016-08-12 DIAGNOSIS — M0600 Rheumatoid arthritis without rheumatoid factor, unspecified site: Secondary | ICD-10-CM | POA: Diagnosis not present

## 2016-08-24 ENCOUNTER — Ambulatory Visit: Payer: Medicare Other | Admitting: Podiatry

## 2016-08-25 ENCOUNTER — Other Ambulatory Visit: Payer: Self-pay | Admitting: Family Medicine

## 2016-08-26 NOTE — Telephone Encounter (Signed)
Refilled: 11/27/15 Last OV: 08/11/16 Last Labs: 12/16/15 Future OV: 10/20/16 Please advise?

## 2016-08-27 NOTE — Telephone Encounter (Signed)
Spoke to pt to confirm if transferring physicians to Dr Richarda Overlie.  She is contemplating transfer.  States is having trouble with her insurance.  Does need refill to have prn.  Will refill x 1.

## 2016-09-05 ENCOUNTER — Ambulatory Visit
Admission: RE | Admit: 2016-09-05 | Discharge: 2016-09-05 | Disposition: A | Discharge status: Home / Self Care | Payer: Medicare Other | Source: Ambulatory Visit | Attending: Internal Medicine | Admitting: Internal Medicine

## 2016-09-05 DIAGNOSIS — Z1231 Encounter for screening mammogram for malignant neoplasm of breast: Secondary | ICD-10-CM | POA: Insufficient documentation

## 2016-09-28 DIAGNOSIS — M0609 Rheumatoid arthritis without rheumatoid factor, multiple sites: Secondary | ICD-10-CM | POA: Diagnosis not present

## 2016-10-12 ENCOUNTER — Ambulatory Visit: Payer: Medicare Other | Admitting: Obstetrics and Gynecology

## 2016-10-13 DIAGNOSIS — Z6823 Body mass index (BMI) 23.0-23.9, adult: Secondary | ICD-10-CM | POA: Diagnosis not present

## 2016-10-13 DIAGNOSIS — M150 Primary generalized (osteo)arthritis: Secondary | ICD-10-CM | POA: Diagnosis not present

## 2016-10-13 DIAGNOSIS — M2550 Pain in unspecified joint: Secondary | ICD-10-CM | POA: Diagnosis not present

## 2016-10-13 DIAGNOSIS — Z79899 Other long term (current) drug therapy: Secondary | ICD-10-CM | POA: Diagnosis not present

## 2016-10-13 DIAGNOSIS — M0609 Rheumatoid arthritis without rheumatoid factor, multiple sites: Secondary | ICD-10-CM | POA: Diagnosis not present

## 2016-10-20 ENCOUNTER — Encounter: Payer: Self-pay | Admitting: Internal Medicine

## 2016-10-20 ENCOUNTER — Ambulatory Visit (INDEPENDENT_AMBULATORY_CARE_PROVIDER_SITE_OTHER): Payer: Medicare Other | Admitting: Internal Medicine

## 2016-10-20 VITALS — BP 140/82 | HR 69 | Temp 98.6°F | Resp 12 | Ht 66.0 in | Wt 143.0 lb

## 2016-10-20 DIAGNOSIS — I6523 Occlusion and stenosis of bilateral carotid arteries: Secondary | ICD-10-CM | POA: Diagnosis not present

## 2016-10-20 DIAGNOSIS — M1990 Unspecified osteoarthritis, unspecified site: Secondary | ICD-10-CM

## 2016-10-20 DIAGNOSIS — F411 Generalized anxiety disorder: Secondary | ICD-10-CM

## 2016-10-20 DIAGNOSIS — Z23 Encounter for immunization: Secondary | ICD-10-CM | POA: Diagnosis not present

## 2016-10-20 DIAGNOSIS — K219 Gastro-esophageal reflux disease without esophagitis: Secondary | ICD-10-CM | POA: Diagnosis not present

## 2016-10-20 DIAGNOSIS — I773 Arterial fibromuscular dysplasia: Secondary | ICD-10-CM

## 2016-10-20 DIAGNOSIS — M67432 Ganglion, left wrist: Secondary | ICD-10-CM

## 2016-10-20 DIAGNOSIS — Z0000 Encounter for general adult medical examination without abnormal findings: Secondary | ICD-10-CM

## 2016-10-20 DIAGNOSIS — I701 Atherosclerosis of renal artery: Secondary | ICD-10-CM

## 2016-10-20 DIAGNOSIS — I10 Essential (primary) hypertension: Secondary | ICD-10-CM

## 2016-10-20 DIAGNOSIS — M67431 Ganglion, right wrist: Secondary | ICD-10-CM

## 2016-10-20 DIAGNOSIS — E210 Primary hyperparathyroidism: Secondary | ICD-10-CM | POA: Diagnosis not present

## 2016-10-20 DIAGNOSIS — K589 Irritable bowel syndrome without diarrhea: Secondary | ICD-10-CM

## 2016-10-20 DIAGNOSIS — M810 Age-related osteoporosis without current pathological fracture: Secondary | ICD-10-CM

## 2016-10-20 MED ORDER — LISINOPRIL 10 MG PO TABS: 10.0000 mg | ORAL_TABLET | Freq: Every day | ORAL | 1 refills | Status: DC

## 2016-10-20 NOTE — Progress Notes (Signed)
Pre-visit discussion using our clinic review tool. No additional management support is needed unless otherwise documented below in the visit note.  

## 2016-10-20 NOTE — Progress Notes (Signed)
Subjective:   Victoria Moss is a 66 y.o. female who presents for an Initial Medicare Annual Wellness Visit.  Review of Systems    No ROS.  Medicare Wellness Visit. Additional risk factors are reflected in the social history.  Cardiac Risk Factors include: advanced age (>80men, >54 women)     Objective:    Today's Vitals   10/20/16 1544  BP: 140/82  Pulse: 69  Resp: 12  Temp: 98.6 F (37 C)  TempSrc: Oral  SpO2: 98%  Weight: 143 lb (64.9 kg)  Height: 5\' 6"  (1.676 m)   Body mass index is 23.08 kg/m.   Current Medications (verified) Outpatient Encounter Prescriptions as of 10/20/2016  Medication Sig  . aspirin 81 MG tablet Take 81 mg by mouth daily.  . Calcium Carb-Cholecalciferol (CALCIUM 600+D3 PO) Take by mouth.  . cyclobenzaprine (FLEXERIL) 5 MG tablet Take 1 tablet (5 mg total) by mouth at bedtime as needed for muscle spasms.  Marland Kitchen dicyclomine (BENTYL) 10 MG capsule One capsule q day prn  . Golimumab (SIMPONI ARIA IV) Inject into the vein every 8 (eight) weeks.  . hydroxychloroquine (PLAQUENIL) 200 MG tablet Take by mouth daily.  . meloxicam (MOBIC) 7.5 MG tablet Take 7.5 mg by mouth daily.  . Multiple Vitamins-Minerals (ALIVE WOMENS 50+ PO) Take 1 tablet by mouth daily.   . Omega-3 Fatty Acids (FISH OIL PO) Take 1 tablet by mouth daily.   Marland Kitchen omeprazole (PRILOSEC) 20 MG capsule TAKE ONE CAPSULE BY MOUTH ONCE DAILY  . Vitamin D, Cholecalciferol, 400 UNITS CAPS Take 1 tablet by mouth daily.   Marland Kitchen lisinopril (PRINIVIL,ZESTRIL) 10 MG tablet Take 1 tablet (10 mg total) by mouth daily.  . [DISCONTINUED] DULoxetine HCl 40 MG CPEP Take 40 mg by mouth daily.   . [DISCONTINUED] Estradiol (VAGIFEM) 10 MCG TABS vaginal tablet Place 1 tab intravaginally qhs x 1 week, then 2 x a week at hs (Patient not taking: Reported on 08/11/2016)  . [DISCONTINUED] Probiotic CAPS Take by mouth.  . [DISCONTINUED] Turmeric 500 MG CAPS Take by mouth daily.   No facility-administered encounter  medications on file as of 10/20/2016.     Allergies (verified) Codeine; Lac bovis; and Penicillins   History: Past Medical History:  Diagnosis Date  . Allergy   . Arthritis   . Basal cell cancer   . Diverticulosis    H/O  . GERD (gastroesophageal reflux disease)   . Heart murmur   . IBS (irritable bowel syndrome)   . Migraine    H/O, none since stopping chocolate  . Osteoporosis   . Rheumatic fever    H/O  . Status post dilation of esophageal narrowing    Past Surgical History:  Procedure Laterality Date  . APPENDECTOMY  1982  . BREAST BIOPSY Right 05/16   axillary node- neg  . CESAREAN SECTION    . DILATION AND CURETTAGE OF UTERUS     x2  . DILATION AND CURETTAGE, DIAGNOSTIC / THERAPEUTIC     x 2  . fatty tumor     on back  . HERNIA REPAIR    . PARATHYROIDECTOMY    . SKIN CANCER EXCISION     BCCA   . TOE SURGERY Left    joint replacement bit toe  . TUBAL LIGATION    . UMBILICAL HERNIA REPAIR     Family History  Problem Relation Age of Onset  . Breast cancer Mother 73  . Hypertension Mother   . Stroke Mother  Late 71  . Arthritis Mother   . Colon polyps Mother   . Cancer Mother        Breast  . Hypertension Father   . Hyperlipidemia Father   . Diabetes Neg Hx   . Colon cancer Neg Hx   . Esophageal cancer Neg Hx   . Kidney disease Neg Hx   . Gallbladder disease Neg Hx    Social History   Occupational History  . Retired Other   Social History Main Topics  . Smoking status: Former Smoker    Years: 5.00    Quit date: 05/09/1973  . Smokeless tobacco: Never Used     Comment: Quit at age 17  . Alcohol use No  . Drug use: No  . Sexual activity: No    Tobacco Counseling Counseling given: Not Answered   Activities of Daily Living In your present state of health, do you have any difficulty performing the following activities: 10/20/2016  Hearing? N  Vision? N  Difficulty concentrating or making decisions? N  Walking or climbing stairs? N    Dressing or bathing? N  Doing errands, shopping? N  Preparing Food and eating ? N  Using the Toilet? N  In the past six months, have you accidently leaked urine? N  Do you have problems with loss of bowel control? N  Managing your Medications? N  Managing your Finances? N  Housekeeping or managing your Housekeeping? N  Some recent data might be hidden    Immunizations and Health Maintenance Immunization History  Administered Date(s) Administered  . Influenza, High Dose Seasonal PF 02/10/2016  . Influenza,inj,Quad PF,36+ Mos 02/04/2013, 02/01/2014, 02/06/2015  . Pneumococcal Conjugate-13 08/14/2015  . Tdap 08/19/2014  . Zoster 05/11/2011   Health Maintenance Due  Topic Date Due  . PNA vac Low Risk Adult (2 of 2 - PPSV23) 08/13/2016    Patient Care Team: Einar Pheasant, MD as PCP - General (Internal Medicine) Einar Pheasant, MD (Internal Medicine) Bary Castilla Forest Gleason, MD (General Surgery)  Indicate any recent Medical Services you may have received from other than [facility] providers in the past year (date may be approximate).     Assessment:   This is a routine wellness examination for Victoria Moss. The goal of the wellness visit is to assist the patient how to close the gaps in care and create a preventative care plan for the patient.   The roster of all physicians providing medical care to patient is listed in the Snapshot section of the chart.  Taking calcium VIT D as appropriate/Osteoporosis risk reviewed.    Safety issues reviewed; Smoke and carbon monoxide detectors in the home. No firearms in the home.  Wears seatbelts when driving or riding with others. Patient does wear sunscreen or protective clothing when in direct sunlight. No violence in the home.  Patient is alert, normal appearance, oriented to person/place/and time. Correctly identified the president of the [place], recall of 3/3 words, and performing simple calculations. Displays appropriate judgement and can read  correct time from watch face.   No new identified risk were noted.  No failures at ADL's or IADL's.    BMI- discussed the importance of a healthy diet, water intake and the benefits of aerobic exercise. Educational material provided.   Daily fluid intake: 5-6 cups of water  Dental- every 6 months.  Sleep patterns- Sleeps  6-7 hours at night.   Patient Concerns: None at this time. Follow up with PCP as needed.  Hearing/Vision screen Hearing Screening  Comments: Patient is able to hear conversational tones without difficulty.  No issues reported.   Vision Screening Comments: Visual acuity not assessed per patient preference since they have regular follow up with the ophthalmologist  Dietary issues and exercise activities discussed: Current Exercise Habits: Home exercise routine, Type of exercise: treadmill;walking;strength training/weights, Frequency (Times/Week): 5, Intensity: Mild  Goals    . Increase lean proteins          Low carb foods      Depression Screen PHQ 2/9 Scores 10/20/2016 08/14/2015 10/22/2014 08/07/2014  PHQ - 2 Score 4 0 4 0  PHQ- 9 Score 10 - 10 -    Fall Risk Fall Risk  10/20/2016 08/14/2015 10/22/2014 08/07/2014  Falls in the past year? Yes No No No  Injury with Fall? Yes - - -  Risk for fall due to : Other (Comment) - - -  Risk for fall due to (comments): fall in yard  - - -    Cognitive Function: MMSE - Mini Mental State Exam 10/20/2016  Orientation to time 5  Orientation to Place 5  Registration 3  Attention/ Calculation 5  Recall 3  Language- name 2 objects 2  Language- repeat 1  Language- follow 3 step command 3  Language- read & follow direction 1  Write a sentence 1  Copy design 1  Total score 30        Screening Tests Health Maintenance  Topic Date Due  . PNA vac Low Risk Adult (2 of 2 - PPSV23) 08/13/2016  . INFLUENZA VACCINE  12/07/2016  . MAMMOGRAM  09/05/2017  . TETANUS/TDAP  08/18/2024  . COLONOSCOPY  05/24/2026  . DEXA SCAN   Completed  . Hepatitis C Screening  Completed      Plan:   End of life planning; Advanced aging; Advanced directives discussed.  No HCPOA/Living Will.  Additional information provided to help them start the conversation with family.  Copy of HCPOA/Living Will requested upon completion. Time spent on this topic is 25 minutes.  I have personally reviewed and noted the following in the patient's chart:   . Medical and social history . Use of alcohol, tobacco or illicit drugs  . Current medications and supplements . Functional ability and status . Nutritional status . Physical activity . Advanced directives . List of other physicians . Hospitalizations, surgeries, and ER visits in previous 12 months . Vitals . Screenings to include cognitive, depression, and falls . Referrals and appointments  In addition, I have reviewed and discussed with patient certain preventive protocols, quality metrics, and best practice recommendations. A written personalized care plan for preventive services as well as general preventive health recommendations were provided to patient.     Varney Biles, LPN   7/79/3903    Reviewed above information.  Agree with plan.  Dr Nicki Reaper

## 2016-10-20 NOTE — Progress Notes (Signed)
Patient ID: Glenda Spelman, female   DOB: 02-Feb-1951, 66 y.o.   MRN: [id number]   Subjective:    Patient ID: [place], female    DOB: 12/10/1950, 66 y.o.   MRN: [id number]  HPI  Patient with past history of arthritis, fibromuscular dysplasia, RAS, GERD and primary hyperparathyroidism.  She comes in today to follow up on these issues as well as for a complete physical exam. She reports she is doing relatively well.  Has a cyst on her right hand.  Just noticed a few weeks ago.  Enlarged.  No significant pain.  Request referral to ortho.  Also reports increased joint pain.  Persistent.  Would like second opinion from another rheumatologist.  Stays active.  Blood pressure has been varying.  Elevated on recent checks.  No chest pain.  No sob.  No acid reflux.  No abdominal pain.  Bowels moving.     Past Medical History:  Diagnosis Date  . Allergy   . Arthritis   . Basal cell cancer   . Diverticulosis    H/O  . GERD (gastroesophageal reflux disease)   . Heart murmur   . IBS (irritable bowel syndrome)   . Migraine    H/O, none since stopping chocolate  . Osteoporosis   . Rheumatic fever    H/O  . Status post dilation of esophageal narrowing    Past Surgical History:  Procedure Laterality Date  . APPENDECTOMY  1982  . BREAST BIOPSY Right 05/16   axillary node- neg  . CESAREAN SECTION    . DILATION AND CURETTAGE OF UTERUS     x2  . DILATION AND CURETTAGE, DIAGNOSTIC / THERAPEUTIC     x 2  . fatty tumor     on back  . HERNIA REPAIR    . PARATHYROIDECTOMY    . SKIN CANCER EXCISION     BCCA   . TOE SURGERY Left    joint replacement bit toe  . TUBAL LIGATION    . UMBILICAL HERNIA REPAIR     Family History  Problem Relation Age of Onset  . Breast cancer Mother 24  . Hypertension Mother   . Stroke Mother        Late 77  . Arthritis Mother   . Colon polyps Mother   . Cancer Mother        Breast  . Hypertension Father   . Hyperlipidemia Father   .  Diabetes Neg Hx   . Colon cancer Neg Hx   . Esophageal cancer Neg Hx   . Kidney disease Neg Hx   . Gallbladder disease Neg Hx    Social History   Social History  . Marital status: Widowed    Spouse name: N/A  . Number of children: 2  . Years of education: N/A   Occupational History  . Retired Other   Social History Main Topics  . Smoking status: Former Smoker    Years: 5.00    Quit date: 05/09/1973  . Smokeless tobacco: Never Used     Comment: Quit at age 51  . Alcohol use No  . Drug use: No  . Sexual activity: No   Other Topics Concern  . None   Social History Narrative  . None    Outpatient Encounter Prescriptions as of 10/20/2016  Medication Sig  . aspirin 81 MG tablet Take 81 mg by mouth daily.  . Calcium Carb-Cholecalciferol (CALCIUM 600+D3 PO) Take by mouth.  . cyclobenzaprine (FLEXERIL) 5  MG tablet Take 1 tablet (5 mg total) by mouth at bedtime as needed for muscle spasms.  Marland Kitchen dicyclomine (BENTYL) 10 MG capsule One capsule q day prn  . Golimumab (SIMPONI ARIA IV) Inject into the vein every 8 (eight) weeks.  . hydroxychloroquine (PLAQUENIL) 200 MG tablet Take by mouth daily.  . meloxicam (MOBIC) 7.5 MG tablet Take 7.5 mg by mouth daily.  . Multiple Vitamins-Minerals (ALIVE WOMENS 50+ PO) Take 1 tablet by mouth daily.   . Omega-3 Fatty Acids (FISH OIL PO) Take 1 tablet by mouth daily.   Marland Kitchen omeprazole (PRILOSEC) 20 MG capsule TAKE ONE CAPSULE BY MOUTH ONCE DAILY  . Vitamin D, Cholecalciferol, 400 UNITS CAPS Take 1 tablet by mouth daily.   Marland Kitchen lisinopril (PRINIVIL,ZESTRIL) 10 MG tablet Take 1 tablet (10 mg total) by mouth daily.  . [DISCONTINUED] DULoxetine HCl 40 MG CPEP Take 40 mg by mouth daily.   . [DISCONTINUED] Estradiol (VAGIFEM) 10 MCG TABS vaginal tablet Place 1 tab intravaginally qhs x 1 week, then 2 x a week at hs (Patient not taking: Reported on 08/11/2016)  . [DISCONTINUED] Probiotic CAPS Take by mouth.  . [DISCONTINUED] Turmeric 500 MG CAPS Take by mouth  daily.   No facility-administered encounter medications on file as of 10/20/2016.     Review of Systems  Constitutional: Negative for appetite change and unexpected weight change.  HENT: Negative for congestion and sinus pressure.   Eyes: Negative for pain and visual disturbance.  Respiratory: Negative for cough, chest tightness and shortness of breath.   Cardiovascular: Negative for chest pain, palpitations and leg swelling.  Gastrointestinal: Negative for abdominal pain, diarrhea, nausea and vomiting.  Genitourinary: Negative for difficulty urinating and dysuria.  Musculoskeletal: Negative for back pain.       Increased joint pains as outlined.  Cyst - right wrist.    Skin: Negative for color change and rash.  Neurological: Negative for dizziness, light-headedness and headaches.  Hematological: Negative for adenopathy. Does not bruise/bleed easily.  Psychiatric/Behavioral: Negative for agitation and dysphoric mood.       Objective:    Physical Exam  Constitutional: She is oriented to person, place, and time. She appears well-developed and well-nourished. No distress.  HENT:  Nose: Nose normal.  Mouth/Throat: Oropharynx is clear and moist.  Eyes: Right eye exhibits no discharge. Left eye exhibits no discharge. No scleral icterus.  Neck: Neck supple. No thyromegaly present.  Cardiovascular: Normal rate and regular rhythm.   Pulmonary/Chest: Breath sounds normal. No accessory muscle usage. No tachypnea. No respiratory distress. She has no decreased breath sounds. She has no wheezes. She has no rhonchi. Right breast exhibits no inverted nipple, no mass, no nipple discharge and no tenderness (no axillary adenopathy). Left breast exhibits no inverted nipple, no mass, no nipple discharge and no tenderness (no axilarry adenopathy).  Abdominal: Soft. Bowel sounds are normal. There is no tenderness.  Musculoskeletal: She exhibits no edema or tenderness.  Cyst - right wrist.      Lymphadenopathy:    She has no cervical adenopathy.  Neurological: She is alert and oriented to person, place, and time.  Skin: Skin is warm. No rash noted. No erythema.  Psychiatric: She has a normal mood and affect. Her behavior is normal.    BP 140/82 (BP Location: Left Arm, Patient Position: Sitting, Cuff Size: Normal)   Pulse 69   Temp 98.6 F (37 C) (Oral)   Resp 12   Ht 5\' 6"  (1.676 m)   Wt 143 lb (  64.9 kg)   SpO2 98%   BMI 23.08 kg/m  Wt Readings from Last 3 Encounters:  10/20/16 143 lb (64.9 kg)  08/11/16 140 lb (63.5 kg)  07/04/16 141 lb 9.6 oz (64.2 kg)     Lab Results  Component Value Date   WBC 5.3 08/28/2015   HGB 12.7 08/28/2015   HCT 36.9 08/28/2015   PLT 292.0 08/28/2015   GLUCOSE 85 12/16/2015   CHOL 155 12/16/2015   TRIG 160.0 (H) 12/16/2015   HDL 55.00 12/16/2015   LDLCALC 68 12/16/2015   ALT 30 12/16/2015   AST 30 12/16/2015   NA 136 12/16/2015   K 4.6 12/16/2015   CL 101 12/16/2015   CREATININE 0.76 12/16/2015   BUN 12 12/16/2015   CO2 31 12/16/2015   TSH 0.91 08/28/2015   HGBA1C 5.6 12/31/2014    Mm Screening Breast Tomo Bilateral  Result Date: 09/06/2016 CLINICAL DATA:  Screening. EXAM: 2D DIGITAL SCREENING BILATERAL MAMMOGRAM WITH CAD AND ADJUNCT TOMO COMPARISON:  Previous exam(s). ACR Breast Density Category c: The breast tissue is heterogeneously dense, which may obscure small masses. FINDINGS: There are no findings suspicious for malignancy. Images were processed with CAD. IMPRESSION: No mammographic evidence of malignancy. A result letter of this screening mammogram will be mailed directly to the patient. RECOMMENDATION: Screening mammogram in one year. (Code:SM-B-01Y) BI-RADS CATEGORY  1: Negative. Electronically Signed   By: Ammie Ferrier M.D.   On: 09/06/2016 09:01       Assessment & Plan:   Problem List Items Addressed This Visit    Carotid stenosis    Being followed by [facility].        Relevant Medications   lisinopril  (PRINIVIL,ZESTRIL) 10 MG tablet   Fibromuscular dysplasia ([place])    Evaluated by [facility] (03/21/16).  Recommended f/u carotid ultrasound in one year.        Relevant Medications   lisinopril (PRINIVIL,ZESTRIL) 10 MG tablet   Ganglion cyst of both wrists    Increased cyst - right wrist.  Refer to ortho for evaluation.        Relevant Orders   Ambulatory referral to Orthopedic Surgery   Generalized anxiety disorder    Has seen Dr Nicolasa Ducking.  Stable.        GERD (gastroesophageal reflux disease)    EGD as outlined.  On omeprazole.  Controlled.        Health care maintenance    Physical today 10/20/16.  Colonoscopy 05/2016.  Colonoscopy 09/06/16 - [facility] I.        Hypertension    Persistent elevation.  Start lisinopril.  Check metabolic panel 1-2 weeks after starting medication.  Follow pressures.  Get her back in soon to reassess.        Relevant Medications   lisinopril (PRINIVIL,ZESTRIL) 10 MG tablet   IBS (irritable bowel syndrome)    Bowels stable.       Inflammatory arthritis    Persistent joint pains.  She request to see a different rheumatologist for evaluation and treatment recs.  simponi now.  Follow.        Relevant Medications   Golimumab ([place] ARIA IV)   Other Relevant Orders   Ambulatory referral to Rheumatology   Osteoporosis    reclast 12/2016.       Primary hyperparathyroidism [facility])    Seeing endocrinology.  Stable.        Renal artery stenosis ([place])    Followed by [facility].  Saw Dr Lucky Cowboy 03/21/16 - stable.  Continue f/u as planned - one year.       Relevant Medications   lisinopril (PRINIVIL,ZESTRIL) 10 MG tablet    Other Visit Diagnoses    Need for pneumococcal vaccination    -  Primary   Encounter for Medicare annual wellness exam           Einar Pheasant, MD

## 2016-10-20 NOTE — Patient Instructions (Addendum)
  Victoria Moss , Thank you for taking time to come for your Medicare Wellness Visit. I appreciate your ongoing commitment to your health goals. Please review the following plan we discussed and let me know if I can assist you in the future.   These are the goals we discussed: Goals    . Increase lean proteins          Low carb foods       This is a list of the screening recommended for you and due dates:  Health Maintenance  Topic Date Due  . Pneumonia vaccines (2 of 2 - PPSV23) 08/13/2016  . Flu Shot  12/07/2016  . Mammogram  09/05/2017  . Tetanus Vaccine  08/18/2024  . Colon Cancer Screening  05/24/2026  . DEXA scan (bone density measurement)  Completed  .  Hepatitis C: One time screening is recommended by Center for Disease Control  (CDC) for  adults born from [birth year] through 1965.   Completed

## 2016-10-20 NOTE — Assessment & Plan Note (Signed)
Physical today 10/20/16.  Colonoscopy 05/2016.  Colonoscopy 09/06/16 - Birads I.

## 2016-10-22 ENCOUNTER — Encounter: Payer: Self-pay | Admitting: Internal Medicine

## 2016-10-22 DIAGNOSIS — I10 Essential (primary) hypertension: Secondary | ICD-10-CM | POA: Insufficient documentation

## 2016-10-22 NOTE — Assessment & Plan Note (Signed)
Being followed by [facility].

## 2016-10-22 NOTE — Assessment & Plan Note (Signed)
EGD as outlined.  On omeprazole.  Controlled.

## 2016-10-22 NOTE — Assessment & Plan Note (Signed)
Bowels stable.  

## 2016-10-22 NOTE — Assessment & Plan Note (Signed)
Persistent joint pains.  She request to see a different rheumatologist for evaluation and treatment recs.  simponi now.  Follow.

## 2016-10-22 NOTE — Assessment & Plan Note (Signed)
reclast 12/2016.

## 2016-10-22 NOTE — Assessment & Plan Note (Signed)
Evaluated by [facility] (03/21/16).  Recommended f/u carotid ultrasound in one year.

## 2016-10-22 NOTE — Assessment & Plan Note (Signed)
Increased cyst - right wrist.  Refer to ortho for evaluation.

## 2016-10-22 NOTE — Assessment & Plan Note (Signed)
Persistent elevation.  Start lisinopril.  Check metabolic panel 1-2 weeks after starting medication.  Follow pressures.  Get her back in soon to reassess.

## 2016-10-22 NOTE — Assessment & Plan Note (Signed)
Seeing endocrinology.  Stable.

## 2016-10-22 NOTE — Assessment & Plan Note (Signed)
Followed by [facility].  Saw Dr Lucky Cowboy 03/21/16 - stable.  Continue f/u as planned - one year.

## 2016-10-22 NOTE — Assessment & Plan Note (Signed)
Has seen Dr Nicolasa Ducking.  Stable.

## 2016-10-25 DIAGNOSIS — S60221A Contusion of right hand, initial encounter: Secondary | ICD-10-CM | POA: Diagnosis not present

## 2016-10-25 NOTE — Addendum Note (Signed)
Addended by: Francella Solian on: 10/25/2016 10:13 AM   Modules accepted: Orders

## 2016-10-26 DIAGNOSIS — M810 Age-related osteoporosis without current pathological fracture: Secondary | ICD-10-CM | POA: Diagnosis not present

## 2016-10-28 ENCOUNTER — Encounter: Payer: Self-pay | Admitting: Internal Medicine

## 2016-10-31 ENCOUNTER — Ambulatory Visit: Payer: Medicare Other | Admitting: Obstetrics and Gynecology

## 2016-11-01 DIAGNOSIS — S60221A Contusion of right hand, initial encounter: Secondary | ICD-10-CM | POA: Diagnosis not present

## 2016-11-02 DIAGNOSIS — E042 Nontoxic multinodular goiter: Secondary | ICD-10-CM | POA: Diagnosis not present

## 2016-11-02 DIAGNOSIS — E892 Postprocedural hypoparathyroidism: Secondary | ICD-10-CM | POA: Diagnosis not present

## 2016-11-02 DIAGNOSIS — M810 Age-related osteoporosis without current pathological fracture: Secondary | ICD-10-CM | POA: Diagnosis not present

## 2016-11-02 DIAGNOSIS — Z8639 Personal history of other endocrine, nutritional and metabolic disease: Secondary | ICD-10-CM | POA: Diagnosis not present

## 2016-11-15 ENCOUNTER — Encounter: Payer: Self-pay | Admitting: Internal Medicine

## 2016-11-17 ENCOUNTER — Other Ambulatory Visit (HOSPITAL_COMMUNITY)
Admission: RE | Admit: 2016-11-17 | Discharge: 2016-11-17 | Disposition: A | Discharge status: Home / Self Care | Payer: Medicare Other | Source: Ambulatory Visit | Attending: Obstetrics and Gynecology | Admitting: Obstetrics and Gynecology

## 2016-11-17 ENCOUNTER — Encounter: Payer: Self-pay | Admitting: Obstetrics and Gynecology

## 2016-11-17 ENCOUNTER — Ambulatory Visit (INDEPENDENT_AMBULATORY_CARE_PROVIDER_SITE_OTHER): Payer: Medicare Other | Admitting: Obstetrics and Gynecology

## 2016-11-17 VITALS — BP 138/84 | HR 68 | Resp 16 | Ht 65.5 in | Wt 138.0 lb

## 2016-11-17 DIAGNOSIS — Z01419 Encounter for gynecological examination (general) (routine) without abnormal findings: Secondary | ICD-10-CM

## 2016-11-17 DIAGNOSIS — Z124 Encounter for screening for malignant neoplasm of cervix: Secondary | ICD-10-CM

## 2016-11-17 DIAGNOSIS — M818 Other osteoporosis without current pathological fracture: Secondary | ICD-10-CM

## 2016-11-17 MED ORDER — LISINOPRIL 20 MG PO TABS
20.0000 mg | ORAL_TABLET | Freq: Every day | ORAL | 3 refills | Status: DC
Start: 2016-11-17 — End: 2017-03-10

## 2016-11-17 NOTE — Telephone Encounter (Signed)
Patient notified and new prescription sent to pharmacy.

## 2016-11-17 NOTE — Telephone Encounter (Signed)
See my reply. Thanks.

## 2016-11-17 NOTE — Progress Notes (Signed)
66 y.o. [id number] WidowedCaucasianF here for annual exam.  Stress with her aging, ill parents. Mom in Nursing home, Dad at home. Both with dementia. She is taking care of them.   2 daughters in [place], one of her daughter is expected her first child, a son, in the fall.  No vaginal bleeding. Not having any further vulvar discomfort.     Patient's last menstrual period was 05/09/2006 (approximate).          Sexually active: No.  The current method of family planning is post menopausal status.    Exercising: Yes.    walking, gym Smoker:  no  Health Maintenance: Pap:  08/07/14 neg. HR HPV:neg   11/13/11 Neg. HR HPV:neg  History of abnormal Pap:  no MMG:  09/06/16 BIRADS1:neg  Colonoscopy:  05/24/16 normal. f/u 5 years  BMD: 10/28/15 Osteoporosis in her spine, osteopenia in her hips. She is on Reclast, followed by Endocrinology.  TDaP:  08/2014    reports that she quit smoking about 43 years ago. She quit after 5.00 years of use. She has never used smokeless tobacco. She reports that she does not drink alcohol or use drugs. Retired Pharmacist, hospital  Past Medical History:  Diagnosis Date  . Allergy   . Arthritis   . Basal cell cancer   . Diverticulosis    H/O  . GERD (gastroesophageal reflux disease)   . Heart murmur   . IBS (irritable bowel syndrome)   . Migraine    H/O, none since stopping chocolate  . Osteoporosis   . Rheumatic fever    H/O  . Status post dilation of esophageal narrowing     Past Surgical History:  Procedure Laterality Date  . APPENDECTOMY  1982  . BREAST BIOPSY Right 05/16   axillary node- neg  . CESAREAN SECTION    . DILATION AND CURETTAGE OF UTERUS     x2  . DILATION AND CURETTAGE, DIAGNOSTIC / THERAPEUTIC     x 2  . fatty tumor     on back  . HERNIA REPAIR    . parathyroid gland one removed    . PARATHYROIDECTOMY    . SKIN CANCER EXCISION     [facility]   . TOE SURGERY Left    joint replacement bit toe  . TUBAL LIGATION    . UMBILICAL HERNIA REPAIR      Current  Outpatient Prescriptions  Medication Sig Dispense Refill  . aspirin 81 MG tablet Take 81 mg by mouth daily.    . Calcium Carb-Cholecalciferol (CALCIUM 600+D3 PO) Take by mouth.    . dicyclomine (BENTYL) 10 MG capsule One capsule q day prn 90 capsule 0  . Golimumab (SIMPONI ARIA IV) Inject into the vein every 8 (eight) weeks.    . hydroxychloroquine (PLAQUENIL) 200 MG tablet Take by mouth daily.    Marland Kitchen lisinopril (PRINIVIL,ZESTRIL) 20 MG tablet Take 1 tablet (20 mg total) by mouth daily. 30 tablet 3  . meloxicam (MOBIC) 7.5 MG tablet Take 7.5 mg by mouth daily.    . Multiple Vitamins-Minerals (ALIVE WOMENS 50+ PO) Take 1 tablet by mouth daily.     . Omega-3 Fatty Acids (FISH OIL PO) Take 1 tablet by mouth daily.     Marland Kitchen omeprazole (PRILOSEC) 20 MG capsule TAKE ONE CAPSULE BY MOUTH ONCE DAILY 90 capsule 3  . cyclobenzaprine (FLEXERIL) 5 MG tablet Take 1 tablet (5 mg total) by mouth at bedtime as needed for muscle spasms. (Patient not taking: Reported on 11/17/2016) 30  tablet 0   No current facility-administered medications for this visit.     Family History  Problem Relation Age of Onset  . Breast cancer Mother 25  . Hypertension Mother   . Stroke Mother        Late 52  . Arthritis Mother   . Colon polyps Mother   . Cancer Mother        Breast  . Hypertension Father   . Hyperlipidemia Father   . Diabetes Neg Hx   . Colon cancer Neg Hx   . Esophageal cancer Neg Hx   . Kidney disease Neg Hx   . Gallbladder disease Neg Hx     Review of Systems  Constitutional: Negative.   HENT: Negative.   Eyes: Negative.   Respiratory: Negative.   Cardiovascular: Negative.   Gastrointestinal: Negative.   Endocrine: Negative.   Genitourinary: Negative.   Musculoskeletal: Negative.   Skin: Negative.   Allergic/Immunologic: Negative.   Neurological: Negative.   Hematological: Negative.   Psychiatric/Behavioral: Negative.     Exam:   BP 138/84 (BP Location: Right Arm, Patient Position:  Sitting, Cuff Size: Normal)   Pulse 68   Resp 16   Ht 5' 5.5" (1.664 m)   Wt 138 lb (62.6 kg)   LMP 05/09/2006 (Approximate)   BMI 22.62 kg/m   Weight change: @WEIGHTCHANGE @ Height:   Height: 5' 5.5" (166.4 cm)  Ht Readings from Last 3 Encounters:  11/17/16 5' 5.5" (1.664 m)  10/20/16 5\' 6"  (1.676 m)  07/04/16 5' 5.75" (1.67 m)    General appearance: alert, cooperative and appears stated age Head: Normocephalic, without obvious abnormality, atraumatic Neck: no adenopathy, supple, symmetrical, trachea midline and thyroid normal to inspection and palpation Lungs: clear to auscultation bilaterally Cardiovascular: regular rate and rhythm Breasts: normal appearance, no masses or tenderness Abdomen: soft, non-tender; bowel sounds normal; no masses,  no organomegaly Extremities: extremities normal, atraumatic, no cyanosis or edema Skin: Skin color, texture, turgor normal. No rashes or lesions Lymph nodes: Cervical, supraclavicular, and axillary nodes normal. No abnormal inguinal nodes palpated Neurologic: Grossly normal   Pelvic: External genitalia:  no lesions              Urethra:  normal appearing urethra with no masses, tenderness or lesions              Bartholins and Skenes: normal                 Vagina: normal appearing atrophic vagina with normal color and discharge, no lesions              Cervix: no lesions               Bimanual Exam:  Uterus:  normal size, contour, position, consistency, mobility, non-tender              Adnexa: no mass, fullness, tenderness               Rectovaginal: Confirms               Anus:  normal sphincter tone, no lesions  Chaperone was present for exam.  A:  Well Woman with normal exam  Osteoporosis followed by Endocrinology  On immunosuppressant for arthritis  P:   Pap with reflex hpv  Mammogram and colonoscopy UTD  Discussed breast self exam  Discussed calcium and vit D intake

## 2016-11-17 NOTE — Telephone Encounter (Signed)
Please call pt and inform her that if her blood pressures are remaining elevated, I would like to go ahead and increase the lisinopril to 20mg  q day.  Continue to spot check her pressures.  Will need new rx sent in.    Dr Nicki Reaper

## 2016-11-17 NOTE — Patient Instructions (Signed)

## 2016-11-17 NOTE — Telephone Encounter (Signed)
See my note

## 2016-11-18 LAB — CYTOLOGY - PAP: Diagnosis: NEGATIVE

## 2016-11-21 ENCOUNTER — Ambulatory Visit (INDEPENDENT_AMBULATORY_CARE_PROVIDER_SITE_OTHER): Payer: Medicare Other | Admitting: Podiatry

## 2016-11-21 DIAGNOSIS — M7751 Other enthesopathy of right foot: Secondary | ICD-10-CM | POA: Diagnosis not present

## 2016-11-21 DIAGNOSIS — M778 Other enthesopathies, not elsewhere classified: Secondary | ICD-10-CM

## 2016-11-21 DIAGNOSIS — I701 Atherosclerosis of renal artery: Secondary | ICD-10-CM

## 2016-11-21 DIAGNOSIS — M779 Enthesopathy, unspecified: Principal | ICD-10-CM

## 2016-11-21 NOTE — Patient Instructions (Signed)

## 2016-11-21 NOTE — Progress Notes (Signed)
She resisted A for follow-up of pain to her right heel. She states that the pain in the heels along the way however have pain on the dorsal lateral aspect of the foot and she points between the fourth and fifth metatarsals proximally.  Objective: Vital signs are stable she's alert and oriented 3 she has from plain range of motion at the fourth fifth met cuboid articulation which is painful. She also has pain on palpation to the proximal fourth intermetatarsal angle.  Assessment: Capsulitis.  Plan: I injected the area today with Kenalog and local anesthetic with a likely this is compensatory in nature follow up with her in 1 month if necessary.

## 2016-12-06 ENCOUNTER — Ambulatory Visit (INDEPENDENT_AMBULATORY_CARE_PROVIDER_SITE_OTHER): Payer: Medicare Other | Admitting: Internal Medicine

## 2016-12-06 ENCOUNTER — Encounter: Payer: Self-pay | Admitting: Internal Medicine

## 2016-12-06 VITALS — BP 138/82 | HR 72 | Temp 98.6°F | Resp 12 | Ht 66.0 in | Wt 136.0 lb

## 2016-12-06 DIAGNOSIS — F411 Generalized anxiety disorder: Secondary | ICD-10-CM

## 2016-12-06 DIAGNOSIS — I773 Arterial fibromuscular dysplasia: Secondary | ICD-10-CM

## 2016-12-06 DIAGNOSIS — M1990 Unspecified osteoarthritis, unspecified site: Secondary | ICD-10-CM

## 2016-12-06 DIAGNOSIS — K219 Gastro-esophageal reflux disease without esophagitis: Secondary | ICD-10-CM | POA: Diagnosis not present

## 2016-12-06 DIAGNOSIS — I701 Atherosclerosis of renal artery: Secondary | ICD-10-CM

## 2016-12-06 DIAGNOSIS — I10 Essential (primary) hypertension: Secondary | ICD-10-CM

## 2016-12-06 DIAGNOSIS — R350 Frequency of micturition: Secondary | ICD-10-CM | POA: Diagnosis not present

## 2016-12-06 DIAGNOSIS — M818 Other osteoporosis without current pathological fracture: Secondary | ICD-10-CM | POA: Diagnosis not present

## 2016-12-06 DIAGNOSIS — K589 Irritable bowel syndrome without diarrhea: Secondary | ICD-10-CM

## 2016-12-06 DIAGNOSIS — E210 Primary hyperparathyroidism: Secondary | ICD-10-CM

## 2016-12-06 LAB — CBC WITH DIFFERENTIAL/PLATELET
BASOS PCT: 0.4 % (ref 0.0–3.0)
Basophils Absolute: 0 10*3/uL (ref 0.0–0.1)
Eosinophils Absolute: 0.2 10*3/uL (ref 0.0–0.7)
Eosinophils Relative: 2.6 % (ref 0.0–5.0)
HEMATOCRIT: 37.2 % (ref 36.0–46.0)
HEMOGLOBIN: 12.3 g/dL (ref 12.0–15.0)
Lymphocytes Relative: 15.7 % (ref 12.0–46.0)
Lymphs Abs: 1.2 10*3/uL (ref 0.7–4.0)
MCHC: 33.1 g/dL (ref 30.0–36.0)
MCV: 93.6 fl (ref 78.0–100.0)
MONOS PCT: 6.6 % (ref 3.0–12.0)
Monocytes Absolute: 0.5 10*3/uL (ref 0.1–1.0)
NEUTROS ABS: 5.6 10*3/uL (ref 1.4–7.7)
Neutrophils Relative %: 74.7 % (ref 43.0–77.0)
PLATELETS: 287 10*3/uL (ref 150.0–400.0)
RBC: 3.98 Mil/uL (ref 3.87–5.11)
RDW: 13.5 % (ref 11.5–15.5)
WBC: 7.5 10*3/uL (ref 4.0–10.5)

## 2016-12-06 LAB — COMPREHENSIVE METABOLIC PANEL
ALBUMIN: 4.7 g/dL (ref 3.5–5.2)
ALT: 16 U/L (ref 0–35)
AST: 20 U/L (ref 0–37)
Alkaline Phosphatase: 53 U/L (ref 39–117)
BILIRUBIN TOTAL: 0.3 mg/dL (ref 0.2–1.2)
BUN: 11 mg/dL (ref 6–23)
CALCIUM: 9.6 mg/dL (ref 8.4–10.5)
CHLORIDE: 98 meq/L (ref 96–112)
CO2: 27 mEq/L (ref 19–32)
CREATININE: 0.71 mg/dL (ref 0.40–1.20)
GFR: 87.38 mL/min (ref >60.00)
Glucose, Bld: 84 mg/dL (ref 70–99)
Potassium: 4.6 mEq/L (ref 3.5–5.1)
Sodium: 132 mEq/L — ABNORMAL LOW (ref 135–145)
Total Protein: 7.1 g/dL (ref 6.0–8.3)

## 2016-12-06 LAB — URINALYSIS, ROUTINE W REFLEX MICROSCOPIC
Bilirubin Urine: NEGATIVE
Hgb urine dipstick: NEGATIVE
KETONES UR: NEGATIVE
Leukocytes, UA: NEGATIVE
Nitrite: NEGATIVE
RBC / HPF: NONE SEEN (ref 0–?)
TOTAL PROTEIN, URINE-UPE24: NEGATIVE
URINE GLUCOSE: NEGATIVE
UROBILINOGEN UA: 0.2 (ref 0.0–1.0)
WBC UA: NONE SEEN (ref 0–?)
pH: 6 (ref 5.0–8.0)

## 2016-12-06 LAB — TSH: TSH: 0.35 u[IU]/mL (ref 0.35–4.50)

## 2016-12-06 NOTE — Progress Notes (Signed)
Pre-visit discussion using our clinic review tool. No additional management support is needed unless otherwise documented below in the visit note.  

## 2016-12-06 NOTE — Progress Notes (Signed)
Patient ID: Victoria Moss, female   DOB: [birth year], 66 y.o.   MRN: [id number]   Subjective:    Patient ID: Victoria Moss, female    DOB: 05/09/1951, 66 y.o.   MRN: [id number]  HPI  Patient here for a scheduled follow up.  Blood pressure was elevated previously.  She has been monitoring her pressures.  Most averaging 130s/70-80s.  She has been followed by rheumatology.  Was on simponi.  Off now.  Still taking plaquenil and meloxicam.  Still with some joint aches.  Some improvement in her wrists.  Has f/u planned with rheumatology.  Tries to stay active.  No chest pain.   No sob.  No acid reflux.  No abdominal pain. Bowels moving. She has lost some weight.  Trying.  Has adjusted her diet.  Is s/p parathyroidectomy.  Receiving IV reclast.  Planning to f/u with Dr Gale Journey for further treatment.  She is having some low back discomfort.  No dysuria.  Some increased frequency.  Some pressure.  No vaginal symptoms.     Past Medical History:  Diagnosis Date  . Allergy   . Arthritis   . Basal cell cancer   . Diverticulosis    H/O  . GERD (gastroesophageal reflux disease)   . Heart murmur   . IBS (irritable bowel syndrome)   . Migraine    H/O, none since stopping chocolate  . Osteoporosis   . Rheumatic fever    H/O  . Status post dilation of esophageal narrowing    Past Surgical History:  Procedure Laterality Date  . APPENDECTOMY  1982  . BREAST BIOPSY Right 05/16   axillary node- neg  . CESAREAN SECTION    . DILATION AND CURETTAGE OF UTERUS     x2  . DILATION AND CURETTAGE, DIAGNOSTIC / THERAPEUTIC     x 2  . fatty tumor     on back  . HERNIA REPAIR    . parathyroid gland one removed    . PARATHYROIDECTOMY    . SKIN CANCER EXCISION     [facility]CCA   . TOE SURGERY Left    joint replacement bit toe  . TUBAL LIGATION    . UMBILICAL HERNIA REPAIR     Family History  Problem Relation Age of Onset  . Breast cancer Mother 36  . Hypertension Mother   . Stroke Mother    Late 88  . Arthritis Mother   . Colon polyps Mother   . Cancer Mother        Breast  . Hypertension Father   . Hyperlipidemia Father   . Diabetes Neg Hx   . Colon cancer Neg Hx   . Esophageal cancer Neg Hx   . Kidney disease Neg Hx   . Gallbladder disease Neg Hx    Social History   Social History  . Marital status: Widowed    Spouse name: N/A  . Number of children: 2  . Years of education: N/A   Occupational History  . Retired Other   Social History Main Topics  . Smoking status: Former Smoker    Years: 5.00    Quit date: 05/09/1973  . Smokeless tobacco: Never Used     Comment: Quit at age 32  . Alcohol use No  . Drug use: No  . Sexual activity: No   Other Topics Concern  . None   Social History Narrative  . None    Outpatient Encounter Prescriptions as of 12/06/2016  Medication Sig  .  aspirin 81 MG tablet Take 81 mg by mouth daily.  . Calcium Carb-Cholecalciferol (CALCIUM 600+D3 PO) Take by mouth.  . cyclobenzaprine (FLEXERIL) 5 MG tablet Take 1 tablet (5 mg total) by mouth at bedtime as needed for muscle spasms.  Marland Kitchen dicyclomine (BENTYL) 10 MG capsule One capsule q day prn  . hydroxychloroquine (PLAQUENIL) 200 MG tablet Take by mouth daily.  Marland Kitchen lisinopril (PRINIVIL,ZESTRIL) 20 MG tablet Take 1 tablet (20 mg total) by mouth daily.  . meloxicam (MOBIC) 7.5 MG tablet Take 7.5 mg by mouth daily.  . Multiple Vitamins-Minerals (ALIVE WOMENS 50+ PO) Take 1 tablet by mouth daily.   . Omega-3 Fatty Acids (FISH OIL PO) Take 1 tablet by mouth daily.   Marland Kitchen omeprazole (PRILOSEC) 20 MG capsule TAKE ONE CAPSULE BY MOUTH ONCE DAILY  . [DISCONTINUED] Golimumab ([place] ARIA IV) Inject into the vein every 8 (eight) weeks.   No facility-administered encounter medications on file as of 12/06/2016.     Review of Systems  Constitutional: Negative for appetite change and unexpected weight change.       Has adjusted her diet.    HENT: Negative for congestion and sinus pressure.     Respiratory: Negative for cough, chest tightness and shortness of breath.   Cardiovascular: Negative for chest pain, palpitations and leg swelling.  Gastrointestinal: Negative for abdominal pain, diarrhea, nausea and vomiting.  Genitourinary: Positive for frequency. Negative for dysuria and vaginal discharge.  Musculoskeletal: Negative for back pain and joint swelling.       Left lower back discomfort.   Skin: Negative for rash.  Neurological: Negative for dizziness, light-headedness and headaches.  Psychiatric/Behavioral: Negative for agitation and dysphoric mood.       Objective:    Physical Exam  Constitutional: She appears well-developed and well-nourished. No distress.  HENT:  Nose: Nose normal.  Mouth/Throat: Oropharynx is clear and moist.  Neck: Neck supple. No thyromegaly present.  Cardiovascular: Normal rate and regular rhythm.   Pulmonary/Chest: Breath sounds normal. No respiratory distress. She has no wheezes.  Abdominal: Soft. Bowel sounds are normal. There is no tenderness.  Musculoskeletal: She exhibits no edema or tenderness.  Lymphadenopathy:    She has no cervical adenopathy.  Skin: No rash noted. No erythema.  Psychiatric: She has a normal mood and affect. Her behavior is normal.    BP 138/82 (BP Location: Left Arm, Patient Position: Sitting, Cuff Size: Normal)   Pulse 72   Temp 98.6 F (37 C) (Oral)   Resp 12   Ht 5\' 6"  (1.676 m)   Wt 136 lb (61.7 kg)   LMP 05/09/2006 (Approximate)   SpO2 96%   BMI 21.95 kg/m  Wt Readings from Last 3 Encounters:  12/06/16 136 lb (61.7 kg)  11/17/16 138 lb (62.6 kg)  10/20/16 143 lb (64.9 kg)     Lab Results  Component Value Date   WBC 7.5 12/06/2016   HGB 12.3 12/06/2016   HCT 37.2 12/06/2016   PLT 287.0 12/06/2016   GLUCOSE 84 12/06/2016   CHOL 155 12/16/2015   TRIG 160.0 (H) 12/16/2015   HDL 55.00 12/16/2015   LDLCALC 68 12/16/2015   ALT 16 12/06/2016   AST 20 12/06/2016   NA 132 (L) 12/06/2016   K  4.6 12/06/2016   CL 98 12/06/2016   CREATININE 0.71 12/06/2016   BUN 11 12/06/2016   CO2 27 12/06/2016   TSH 0.35 12/06/2016   HGBA1C 5.6 12/31/2014       Assessment & Plan:  Problem List Items Addressed This Visit    Fibromuscular dysplasia ([place])    Evaluated by [facility] (03/21/16).  Recommended f/u carotid ultrasound in one year.        Generalized anxiety disorder    Followed by Dr Nicolasa Ducking.        GERD (gastroesophageal reflux disease)    Controlled on omeprazole.        Hypertension - Primary    Blood pressure under good control.  Continue same medication regimen.  Follow pressures.  Follow metabolic panel.        Relevant Orders   CBC with Differential/Platelet (Completed)   Comprehensive metabolic panel (Completed)   TSH (Completed)   IBS (irritable bowel syndrome)    Bowels stable.        Inflammatory arthritis    Seeing rheumatology.  Was on simponi.  Stopped on her own.  Has f/u planned with rheumatology.        Osteoporosis    Last reclast 12/2015.        Primary hyperparathyroidism ([place])    Has been followed by endocrinology.  Planning to f/u with Dr Gale Journey.  Will be due reclast.        Renal artery stenosis ([place])    Evaluated by Dr Lucky Cowboy 03/21/16 - stable.  Continue risk factor modification.         Other Visit Diagnoses    Urinary frequency       Increased frequency and low back pain.  check urinalysis to confirm no infection.    Relevant Orders   Urinalysis, Routine w reflex microscopic (Completed)   Urine Culture       Einar Pheasant, MD

## 2016-12-07 ENCOUNTER — Other Ambulatory Visit: Payer: Self-pay | Admitting: Internal Medicine

## 2016-12-07 DIAGNOSIS — E871 Hypo-osmolality and hyponatremia: Secondary | ICD-10-CM

## 2016-12-07 LAB — URINE CULTURE: ORGANISM ID, BACTERIA: NO GROWTH

## 2016-12-07 NOTE — Assessment & Plan Note (Signed)
Evaluated by [facility] (03/21/16).  Recommended f/u carotid ultrasound in one year.

## 2016-12-07 NOTE — Assessment & Plan Note (Signed)
Controlled on omeprazole.   

## 2016-12-07 NOTE — Assessment & Plan Note (Signed)
Bowels stable.  

## 2016-12-07 NOTE — Assessment & Plan Note (Signed)
Has been followed by endocrinology.  Planning to f/u with Dr Gale Journey.  Will be due reclast.

## 2016-12-07 NOTE — Assessment & Plan Note (Signed)
Followed by Dr Kapur.  

## 2016-12-07 NOTE — Assessment & Plan Note (Signed)
Last reclast 12/2015.

## 2016-12-07 NOTE — Assessment & Plan Note (Signed)
Evaluated by Dr Lucky Cowboy 03/21/16 - stable.  Continue risk factor modification.

## 2016-12-07 NOTE — Progress Notes (Signed)
Order placed for f/u sodium.  ?

## 2016-12-07 NOTE — Assessment & Plan Note (Signed)
Blood pressure under good control.  Continue same medication regimen.  Follow pressures.  Follow metabolic panel.   

## 2016-12-07 NOTE — Assessment & Plan Note (Signed)
Seeing rheumatology.  Was on simponi.  Stopped on her own.  Has f/u planned with rheumatology.

## 2016-12-08 ENCOUNTER — Encounter: Payer: Self-pay | Admitting: Internal Medicine

## 2016-12-09 ENCOUNTER — Other Ambulatory Visit: Payer: Self-pay | Admitting: Internal Medicine

## 2016-12-14 ENCOUNTER — Other Ambulatory Visit: Payer: Self-pay

## 2016-12-15 ENCOUNTER — Encounter: Payer: Self-pay | Admitting: Internal Medicine

## 2016-12-15 ENCOUNTER — Other Ambulatory Visit (INDEPENDENT_AMBULATORY_CARE_PROVIDER_SITE_OTHER): Payer: Medicare Other

## 2016-12-15 DIAGNOSIS — E871 Hypo-osmolality and hyponatremia: Secondary | ICD-10-CM

## 2016-12-15 LAB — SODIUM: Sodium: 135 mEq/L (ref 135–145)

## 2016-12-26 ENCOUNTER — Ambulatory Visit: Payer: Medicare Other | Admitting: Podiatry

## 2017-01-26 ENCOUNTER — Ambulatory Visit (INDEPENDENT_AMBULATORY_CARE_PROVIDER_SITE_OTHER): Payer: Medicare Other | Admitting: *Deleted

## 2017-01-26 DIAGNOSIS — Z23 Encounter for immunization: Secondary | ICD-10-CM | POA: Diagnosis not present

## 2017-02-02 DIAGNOSIS — Z7982 Long term (current) use of aspirin: Secondary | ICD-10-CM | POA: Diagnosis not present

## 2017-02-02 DIAGNOSIS — M12841 Other specific arthropathies, not elsewhere classified, right hand: Secondary | ICD-10-CM | POA: Diagnosis not present

## 2017-02-02 DIAGNOSIS — M810 Age-related osteoporosis without current pathological fracture: Secondary | ICD-10-CM | POA: Diagnosis not present

## 2017-02-02 DIAGNOSIS — M064 Inflammatory polyarthropathy: Secondary | ICD-10-CM | POA: Diagnosis not present

## 2017-02-02 DIAGNOSIS — M12842 Other specific arthropathies, not elsewhere classified, left hand: Secondary | ICD-10-CM | POA: Diagnosis not present

## 2017-02-02 DIAGNOSIS — M25531 Pain in right wrist: Secondary | ICD-10-CM | POA: Diagnosis not present

## 2017-02-02 DIAGNOSIS — Z880 Allergy status to penicillin: Secondary | ICD-10-CM | POA: Diagnosis not present

## 2017-02-02 DIAGNOSIS — K219 Gastro-esophageal reflux disease without esophagitis: Secondary | ICD-10-CM | POA: Diagnosis not present

## 2017-02-02 DIAGNOSIS — K589 Irritable bowel syndrome without diarrhea: Secondary | ICD-10-CM | POA: Diagnosis not present

## 2017-02-02 DIAGNOSIS — Z79899 Other long term (current) drug therapy: Secondary | ICD-10-CM | POA: Diagnosis not present

## 2017-02-02 DIAGNOSIS — Z885 Allergy status to narcotic agent status: Secondary | ICD-10-CM | POA: Diagnosis not present

## 2017-02-02 DIAGNOSIS — M1990 Unspecified osteoarthritis, unspecified site: Secondary | ICD-10-CM | POA: Diagnosis not present

## 2017-02-02 DIAGNOSIS — I10 Essential (primary) hypertension: Secondary | ICD-10-CM | POA: Diagnosis not present

## 2017-02-02 DIAGNOSIS — Z87891 Personal history of nicotine dependence: Secondary | ICD-10-CM | POA: Diagnosis not present

## 2017-02-02 DIAGNOSIS — Z791 Long term (current) use of non-steroidal anti-inflammatories (NSAID): Secondary | ICD-10-CM | POA: Diagnosis not present

## 2017-02-02 DIAGNOSIS — Z6822 Body mass index (BMI) 22.0-22.9, adult: Secondary | ICD-10-CM | POA: Diagnosis not present

## 2017-02-07 ENCOUNTER — Other Ambulatory Visit: Payer: Self-pay | Admitting: Internal Medicine

## 2017-02-07 NOTE — Telephone Encounter (Signed)
Please confirm if pt still needs.  If so, when takes?

## 2017-02-07 NOTE — Telephone Encounter (Signed)
Last OV Was 12/06/2016, last fill was 08/27/16 #30 with no refills.  Please advise, thanks

## 2017-02-08 NOTE — Telephone Encounter (Signed)
Notify pt that I have refilled the medication.  No problem to refill.  I just wanted to confirm still needed.

## 2017-02-08 NOTE — Telephone Encounter (Signed)
Spoke with the patient, it was filled in April with #30 pills, she has taken 28 of them and has 2 left.  Only takes when needed, at night .  She has an upcoming appt in November and will discuss more with you then, she only wants it as a just in case for when she needs at bedtime to reduce achy spasms in hips. thanks

## 2017-02-28 DIAGNOSIS — M810 Age-related osteoporosis without current pathological fracture: Secondary | ICD-10-CM | POA: Diagnosis not present

## 2017-03-09 ENCOUNTER — Encounter: Payer: Self-pay | Admitting: Internal Medicine

## 2017-03-09 ENCOUNTER — Ambulatory Visit (INDEPENDENT_AMBULATORY_CARE_PROVIDER_SITE_OTHER): Payer: Medicare Other | Admitting: Internal Medicine

## 2017-03-09 DIAGNOSIS — E210 Primary hyperparathyroidism: Secondary | ICD-10-CM

## 2017-03-09 DIAGNOSIS — M818 Other osteoporosis without current pathological fracture: Secondary | ICD-10-CM

## 2017-03-09 DIAGNOSIS — I10 Essential (primary) hypertension: Secondary | ICD-10-CM | POA: Diagnosis not present

## 2017-03-09 DIAGNOSIS — M1990 Unspecified osteoarthritis, unspecified site: Secondary | ICD-10-CM

## 2017-03-09 DIAGNOSIS — K219 Gastro-esophageal reflux disease without esophagitis: Secondary | ICD-10-CM | POA: Diagnosis not present

## 2017-03-09 DIAGNOSIS — I773 Arterial fibromuscular dysplasia: Secondary | ICD-10-CM

## 2017-03-09 DIAGNOSIS — F411 Generalized anxiety disorder: Secondary | ICD-10-CM

## 2017-03-09 DIAGNOSIS — I701 Atherosclerosis of renal artery: Secondary | ICD-10-CM | POA: Diagnosis not present

## 2017-03-09 NOTE — Progress Notes (Signed)
Patient ID: Victoria Moss, female   DOB: [birth year], 66 y.o.   MRN: [id number]   Subjective:    Patient ID: Victoria Moss, female    DOB: August 02, 1950, 66 y.o.   MRN: [id number]  HPI  Patient here for a scheduled follow up.  States she is doing relatively well.  Increased stress.  Discussed with her today.  She feels she needs something to help level things out.  She was being followed by Dr Nicolasa Ducking.  Has tried cymbalta.  She is unsure what else tried.  Thinks had intolerance to zoloft.  No suicidal ideations.  Reports increased stress and anxiety.  No chest pain.  No sob.  No acid reflux.  No abdominal pain.  Seeing rheumatology.  Has been diagnosed with  Pseudo gout.  Saw endocrinology 07/2016.  Recommended f/u thyroid ultrasound in 18 months.  Due f/u carotid ultrasound in 03/2017.     Past Medical History:  Diagnosis Date  . Allergy   . Arthritis   . Basal cell cancer   . Diverticulosis    H/O  . GERD (gastroesophageal reflux disease)   . Heart murmur   . IBS (irritable bowel syndrome)   . Migraine    H/O, none since stopping chocolate  . Osteoporosis   . Rheumatic fever    H/O  . Status post dilation of esophageal narrowing    Past Surgical History:  Procedure Laterality Date  . APPENDECTOMY  1982  . BREAST BIOPSY Right 05/16   axillary node- neg  . CESAREAN SECTION    . DILATION AND CURETTAGE OF UTERUS     x2  . DILATION AND CURETTAGE, DIAGNOSTIC / THERAPEUTIC     x 2  . fatty tumor     on back  . HERNIA REPAIR    . parathyroid gland one removed    . PARATHYROIDECTOMY    . SKIN CANCER EXCISION     [facility]   . TOE SURGERY Left    joint replacement bit toe  . TUBAL LIGATION    . UMBILICAL HERNIA REPAIR     Family History  Problem Relation Age of Onset  . Breast cancer Mother 66  . Hypertension Mother   . Stroke Mother        Late 12  . Arthritis Mother   . Colon polyps Mother   . Cancer Mother        Breast  . Hypertension Father   .  Hyperlipidemia Father   . Diabetes Neg Hx   . Colon cancer Neg Hx   . Esophageal cancer Neg Hx   . Kidney disease Neg Hx   . Gallbladder disease Neg Hx    Social History   Socioeconomic History  . Marital status: Widowed    Spouse name: None  . Number of children: 2  . Years of education: None  . Highest education level: None  Social Needs  . Financial resource strain: None  . Food insecurity - worry: None  . Food insecurity - inability: None  . Transportation needs - medical: None  . Transportation needs - non-medical: None  Occupational History  . Occupation: Retired    Fish farm manager: [employer]  Tobacco Use  . Smoking status: Former Smoker    Years: 5.00    Last attempt to quit: 05/09/1973    Years since quitting: 43.8  . Smokeless tobacco: Never Used  . Tobacco comment: Quit at age 45  Substance and Sexual Activity  . Alcohol use: No  Alcohol/week: 0.0 oz  . Drug use: No  . Sexual activity: No  Other Topics Concern  . None  Social History Narrative  . None    Outpatient Encounter Medications as of 03/09/2017  Medication Sig  . aspirin 81 MG tablet Take 81 mg by mouth daily.  . Calcium Carb-Cholecalciferol (CALCIUM 600+D3 PO) Take by mouth.  . cyclobenzaprine (FLEXERIL) 5 MG tablet TAKE 1 TABLET BY MOUTH AT BEDTIME AS NEEDED FOR MUSCLE SPASMS  . dicyclomine (BENTYL) 10 MG capsule TAKE ONE CASPULE BY MOUTH ONCE A DAY AS NEEDED AS DIRCTED BY PHYSICIAN.  . hydroxychloroquine (PLAQUENIL) 200 MG tablet Take by mouth daily.  . meloxicam (MOBIC) 7.5 MG tablet Take 7.5 mg by mouth daily.  . Multiple Vitamins-Minerals (ALIVE WOMENS 50+ PO) Take 1 tablet by mouth daily.   . Omega-3 Fatty Acids (FISH OIL PO) Take 1 tablet by mouth daily.   Marland Kitchen omeprazole (PRILOSEC) 20 MG capsule TAKE ONE CAPSULE BY MOUTH ONCE DAILY  . [DISCONTINUED] lisinopril (PRINIVIL,ZESTRIL) 20 MG tablet Take 1 tablet (20 mg total) by mouth daily.   No facility-administered encounter medications on file as of  03/09/2017.     Review of Systems  Constitutional: Negative for appetite change and unexpected weight change.  HENT: Negative for congestion and sinus pressure.   Respiratory: Negative for cough, chest tightness and shortness of breath.   Cardiovascular: Negative for chest pain, palpitations and leg swelling.  Gastrointestinal: Negative for abdominal pain, diarrhea, nausea and vomiting.  Genitourinary: Negative for difficulty urinating and dysuria.  Musculoskeletal: Negative for back pain and gait problem.  Skin: Negative for color change and rash.  Neurological: Negative for dizziness, light-headedness and headaches.  Psychiatric/Behavioral: Negative for confusion.       Increased stress as outlined.         Objective:    Physical Exam  Constitutional: She appears well-developed and well-nourished. No distress.  HENT:  Nose: Nose normal.  Mouth/Throat: Oropharynx is clear and moist.  Neck: Neck supple. No thyromegaly present.  Cardiovascular: Normal rate and regular rhythm.  Pulmonary/Chest: Breath sounds normal. No respiratory distress. She has no wheezes.  Abdominal: Soft. Bowel sounds are normal. There is no tenderness.  Musculoskeletal: She exhibits no edema or tenderness.  Lymphadenopathy:    She has no cervical adenopathy.  Skin: No rash noted. No erythema.  Psychiatric: She has a normal mood and affect. Her behavior is normal.    BP 136/80 (BP Location: Left Arm, Patient Position: Sitting, Cuff Size: Normal)   Pulse 67   Temp 98.6 F (37 C) (Oral)   Resp 14   Ht 5\' 6"  (1.676 m)   Wt 142 lb 6.4 oz (64.6 kg)   LMP 05/09/2006 (Approximate)   SpO2 97%   BMI 22.98 kg/m  Wt Readings from Last 3 Encounters:  03/09/17 142 lb 6.4 oz (64.6 kg)  12/06/16 136 lb (61.7 kg)  11/17/16 138 lb (62.6 kg)     Lab Results  Component Value Date   WBC 7.5 12/06/2016   HGB 12.3 12/06/2016   HCT 37.2 12/06/2016   PLT 287.0 12/06/2016   GLUCOSE 84 12/06/2016   CHOL 155  12/16/2015   TRIG 160.0 (H) 12/16/2015   HDL 55.00 12/16/2015   LDLCALC 68 12/16/2015   ALT 16 12/06/2016   AST 20 12/06/2016   NA 135 12/15/2016   K 4.6 12/06/2016   CL 98 12/06/2016   CREATININE 0.71 12/06/2016   BUN 11 12/06/2016   CO2 27 12/06/2016  TSH 0.35 12/06/2016   HGBA1C 5.6 12/31/2014       Assessment & Plan:   Problem List Items Addressed This Visit    Fibromuscular dysplasia ([place])    Followed by [facility].  Last evaluated 11.13.17.  Scheduled for f/u ultrasound next month.        Generalized anxiety disorder    Has been followed by Dr Nicolasa Ducking previously.  Increased stress.  Discussed with her today.  She elects to f/u here.  Discussed starting medication. She is unsure medications tried previously.  I know she has been on cymbalta.  Thinks intolerance to zoloft.  Obtain records.        GERD (gastroesophageal reflux disease)    Controlled on omeprazole.        Hypertension    Blood pressure varying.  Elevated on some outside checks.  recheck here improved.  Continue same medication regimen.  Follow pressures.  Follow metabolic panel.        Inflammatory arthritis    Seeing rheumatology.  States diagnosed with pseudogout.  Stable.  Follow.        Osteoporosis    Receiving reclast.  Planning to get third dose soon.        Primary hyperparathyroidism [facility])    Seeing Dr Gale Journey now.        Renal artery stenosis ([place])    Evaluated by Dr Lucky Cowboy.  Stable.  Continue risk factor modification.            Einar Pheasant, MD

## 2017-03-10 ENCOUNTER — Other Ambulatory Visit: Payer: Self-pay | Admitting: Internal Medicine

## 2017-03-10 DIAGNOSIS — H8109 Meniere's disease, unspecified ear: Secondary | ICD-10-CM | POA: Diagnosis not present

## 2017-03-10 DIAGNOSIS — H905 Unspecified sensorineural hearing loss: Secondary | ICD-10-CM | POA: Diagnosis not present

## 2017-03-12 NOTE — Assessment & Plan Note (Signed)
Followed by [facility].  Last evaluated 11.13.17.  Scheduled for f/u ultrasound next month.

## 2017-03-12 NOTE — Assessment & Plan Note (Signed)
Seeing Dr Gale Journey now.

## 2017-03-12 NOTE — Assessment & Plan Note (Signed)
Receiving reclast.  Planning to get third dose soon.

## 2017-03-12 NOTE — Assessment & Plan Note (Signed)
Seeing rheumatology.  States diagnosed with pseudogout.  Stable.  Follow.

## 2017-03-12 NOTE — Assessment & Plan Note (Signed)
Evaluated by Dr Lucky Cowboy.  Stable.  Continue risk factor modification.

## 2017-03-12 NOTE — Assessment & Plan Note (Addendum)
Blood pressure varying.  Elevated on some outside checks.  recheck here improved.  Continue same medication regimen.  Follow pressures.  Follow metabolic panel.

## 2017-03-12 NOTE — Assessment & Plan Note (Signed)
Has been followed by Dr Nicolasa Ducking previously.  Increased stress.  Discussed with her today.  She elects to f/u here.  Discussed starting medication. She is unsure medications tried previously.  I know she has been on cymbalta.  Thinks intolerance to zoloft.  Obtain records.

## 2017-03-12 NOTE — Assessment & Plan Note (Signed)
Controlled on omeprazole.   

## 2017-03-13 ENCOUNTER — Telehealth: Payer: Self-pay

## 2017-03-13 ENCOUNTER — Encounter: Payer: Self-pay | Admitting: Internal Medicine

## 2017-03-13 NOTE — Telephone Encounter (Signed)
Patient was seen in office on 03/09/17. Do not see where any new meds were sent in.

## 2017-03-13 NOTE — Telephone Encounter (Signed)
I had told her that I would need to check with Dr Waylan Boga office and she what she has tried.  She had states there were multiple medications.  I reviewed Dr Waylan Boga note that we have and I know she has tried cymbalta.  Pt also had mentioned had taken zoloft.  Please call Dr Waylan Boga office and see what other meds they have tried with pt.  We referred her, so we should be able to get list of meds tried.  Thanks.  Please let pt know we are waiting on list if we do not get the list today.

## 2017-03-13 NOTE — Telephone Encounter (Signed)
Copied from [place] [phone]. Topic: Inquiry >> Mar 13, 2017  9:55 AM Victoria Moss wrote: Reason for CRM: Seen Dr Nicki Reaper last Thursday Checkup  and she discussed prescribing an anit-depressant, the pt hasn't not heard anything back yet she checked with her pharmacy  and they didn't have it yet.

## 2017-03-14 ENCOUNTER — Telehealth: Payer: Self-pay

## 2017-03-14 NOTE — Telephone Encounter (Signed)
Sent my chart message to pt about starting citalopram.

## 2017-03-14 NOTE — Telephone Encounter (Signed)
Per fax from ENT they wanted to start her on Dyazide 37.5mg -25mg . You made a note giving okay and once she started to check MetB and BP. Pt states she is starting Dyazide tomorrow 03/15/17. Made a lab appt for 03/28/17. When would you be able to work in her BP f/u?

## 2017-03-14 NOTE — Telephone Encounter (Signed)
I can see her at 12:00 on 03/29/17.

## 2017-03-14 NOTE — Telephone Encounter (Signed)
Called Dr. Waylan Boga office and states that the only meds she has listed that patient has tried are Melatonin 5mg , Zoloft 50mg , and Cymbalta at 20,30, and 60mg . Last o/v with them was 06/03/16. Mychart open as well for same thing from patient.

## 2017-03-15 MED ORDER — CITALOPRAM HYDROBROMIDE 10 MG PO TABS: 10.0000 mg | ORAL_TABLET | Freq: Every day | ORAL | 1 refills | Status: DC

## 2017-03-15 NOTE — Telephone Encounter (Signed)
Left message to return call to our office.  

## 2017-03-15 NOTE — Telephone Encounter (Signed)
Called patient she is ok to come in at that time/date can you add to schedule.

## 2017-03-16 NOTE — Telephone Encounter (Signed)
Patient scheduled.

## 2017-03-17 ENCOUNTER — Encounter: Payer: Self-pay | Admitting: Internal Medicine

## 2017-03-17 DIAGNOSIS — H8109 Meniere's disease, unspecified ear: Secondary | ICD-10-CM

## 2017-03-17 NOTE — Telephone Encounter (Signed)
I have placed the order for the lab.  Ok to send results.

## 2017-03-24 ENCOUNTER — Telehealth: Payer: Self-pay | Admitting: Internal Medicine

## 2017-03-24 NOTE — Telephone Encounter (Signed)
Need to know if she is taking anything more for acid reflux.  Specific symptoms?

## 2017-03-24 NOTE — Telephone Encounter (Signed)
She is taking zantac 75mg  qd. Patient states she is having dry cough and burning in throat. She also feels like she had "lump" in throat. Patient states when she d/c the Prilosec she had same symptoms.

## 2017-03-24 NOTE — Telephone Encounter (Signed)
Called patient she will take as directed and let you know how she is doing a f/u next week.

## 2017-03-24 NOTE — Telephone Encounter (Signed)
Have her increase the zantac to 150mg  bid (take 30 minutes before breakfast and 30 minutes before evening meal)

## 2017-03-24 NOTE — Telephone Encounter (Signed)
Please advise 

## 2017-03-24 NOTE — Telephone Encounter (Signed)
Copied from [place] [id number]. Topic: Quick Communication - See Telephone Encounter >> Mar 24, 2017  8:25 AM Robina Ade, Helene Kelp D wrote: CRM for notification. See Telephone encounter for: 03/24/17. Patient continue to have Acid reflect and want to know if she can continue taking her prilosec. Patient would like to talk to provider or CMA.

## 2017-03-27 ENCOUNTER — Ambulatory Visit (INDEPENDENT_AMBULATORY_CARE_PROVIDER_SITE_OTHER): Payer: Medicare Other

## 2017-03-27 ENCOUNTER — Ambulatory Visit (INDEPENDENT_AMBULATORY_CARE_PROVIDER_SITE_OTHER): Payer: Medicare Other | Admitting: Vascular Surgery

## 2017-03-27 ENCOUNTER — Encounter (INDEPENDENT_AMBULATORY_CARE_PROVIDER_SITE_OTHER): Payer: Self-pay | Admitting: Vascular Surgery

## 2017-03-27 VITALS — BP 145/81 | HR 88 | Resp 15 | Ht 65.5 in | Wt 140.0 lb

## 2017-03-27 DIAGNOSIS — I701 Atherosclerosis of renal artery: Secondary | ICD-10-CM

## 2017-03-27 DIAGNOSIS — I10 Essential (primary) hypertension: Secondary | ICD-10-CM

## 2017-03-27 DIAGNOSIS — I6523 Occlusion and stenosis of bilateral carotid arteries: Secondary | ICD-10-CM

## 2017-03-27 DIAGNOSIS — I773 Arterial fibromuscular dysplasia: Secondary | ICD-10-CM | POA: Diagnosis not present

## 2017-03-27 DIAGNOSIS — K219 Gastro-esophageal reflux disease without esophagitis: Secondary | ICD-10-CM

## 2017-03-27 NOTE — Progress Notes (Signed)
MRN : [id number]  Victoria Moss is a 66 y.o. (1950/06/22) female who presents with chief complaint of No chief complaint on file. Marland Kitchen  History of Present Illness: The patient is seen for follow up evaluation of carotid stenosis. The carotid stenosis followed by ultrasound.   The patient denies amaurosis fugax. There is no recent history of TIA symptoms or focal motor deficits. There is no prior documented CVA.  She is also here for follow u regarding renal artery stenosis.  No abrupt changes in her BP control over the past year.  The patient is taking enteric-coated aspirin 81 mg daily.  There is no history of migraine headaches. There is no history of seizures.  The patient has a history of coronary artery disease, no recent episodes of angina or shortness of breath. The patient denies PAD or claudication symptoms. There is a history of hyperlipidemia which is being treated with a statin.     No outpatient medications have been marked as taking for the 03/27/17 encounter (Appointment) with Delana Meyer, Dolores Lory, MD.    Past Medical History:  Diagnosis Date  . Allergy   . Arthritis   . Basal cell cancer   . Diverticulosis    H/O  . GERD (gastroesophageal reflux disease)   . Heart murmur   . IBS (irritable bowel syndrome)   . Migraine    H/O, none since stopping chocolate  . Osteoporosis   . Rheumatic fever    H/O  . Status post dilation of esophageal narrowing     Past Surgical History:  Procedure Laterality Date  . APPENDECTOMY  1982  . BREAST BIOPSY Right 05/16   axillary node- neg  . CESAREAN SECTION    . DILATION AND CURETTAGE OF UTERUS     x2  . DILATION AND CURETTAGE, DIAGNOSTIC / THERAPEUTIC     x 2  . fatty tumor     on back  . HERNIA REPAIR    . parathyroid gland one removed    . PARATHYROIDECTOMY    . SKIN CANCER EXCISION     BCCA   . TOE SURGERY Left    joint replacement bit toe  . TUBAL LIGATION    . UMBILICAL HERNIA REPAIR       Social History Social History   Tobacco Use  . Smoking status: Former Smoker    Years: 5.00    Last attempt to quit: 05/09/1973    Years since quitting: 43.9  . Smokeless tobacco: Never Used  . Tobacco comment: Quit at age 39  Substance Use Topics  . Alcohol use: No    Alcohol/week: 0.0 oz  . Drug use: No    Family History Family History  Problem Relation Age of Onset  . Breast cancer Mother 100  . Hypertension Mother   . Stroke Mother        Late 7  . Arthritis Mother   . Colon polyps Mother   . Cancer Mother        Breast  . Hypertension Father   . Hyperlipidemia Father   . Diabetes Neg Hx   . Colon cancer Neg Hx   . Esophageal cancer Neg Hx   . Kidney disease Neg Hx   . Gallbladder disease Neg Hx     Allergies  Allergen Reactions  . Codeine Itching  . Lac Bovis Diarrhea    Other reaction(s): Abdominal Pain  . Penicillins Itching     REVIEW OF SYSTEMS (Negative unless checked)  Constitutional: [] Weight loss  [] Fever  [] Chills Cardiac: [] Chest pain   [] Chest pressure   [] Palpitations   [] Shortness of breath when laying flat   [] Shortness of breath with exertion. Vascular:  [] Pain in legs with walking   [] Pain in legs at rest  [] History of DVT   [] Phlebitis   [] Swelling in legs   [] Varicose veins   [] Non-healing ulcers Pulmonary:   [] Uses home oxygen   [] Productive cough   [] Hemoptysis   [] Wheeze  [] COPD   [] Asthma Neurologic:  [] Dizziness   [] Seizures   [] History of stroke   [] History of TIA  [] Aphasia   [] Vissual changes   [] Weakness or numbness in arm   [] Weakness or numbness in leg Musculoskeletal:   [] Joint swelling   [x] Joint pain   [x] Low back pain Hematologic:  [] Easy bruising  [] Easy bleeding   [] Hypercoagulable state   [] Anemic Gastrointestinal:  [] Diarrhea   [] Vomiting  [] Gastroesophageal reflux/heartburn   [] Difficulty swallowing. Genitourinary:  [] Chronic kidney disease   [] Difficult urination  [] Frequent urination   [] Blood in urine Skin:   [] Rashes   [] Ulcers  Psychological:  [] History of anxiety   []  History of major depression.  Physical Examination  There were no vitals filed for this visit. There is no height or weight on file to calculate BMI. Gen: WD/WN, NAD Head: [place]/AT, No temporalis wasting.  Ear/Nose/Throat: Hearing grossly intact, nares w/o erythema or drainage Eyes: PER, EOMI, sclera nonicteric.  Neck: Supple, no large masses.   Pulmonary:  Good air movement, no audible wheezing bilaterally, no use of accessory muscles.  Cardiac: RRR, no JVD Vascular: bilateral carotid bruits Vessel Right Left  Radial Palpable Palpable  Ulnar Palpable Palpable  Brachial Palpable Palpable  Carotid Palpable Palpable  Femoral Palpable Palpable  Popliteal Palpable Palpable  PT Palpable Palpable  DP Palpable Palpable  Gastrointestinal: Non-distended. No guarding/no peritoneal signs.  Musculoskeletal: M/S 5/5 throughout.  No deformity or atrophy.  Neurologic: CN 2-12 intact. Symmetrical.  Speech is fluent. Motor exam as listed above. Psychiatric: Judgment intact, Mood & affect appropriate for pt's clinical situation. Dermatologic: No rashes or ulcers noted.  No changes consistent with cellulitis. Lymph : No lichenification or skin changes of chronic lymphedema.  CBC Lab Results  Component Value Date   WBC 7.5 12/06/2016   HGB 12.3 12/06/2016   HCT 37.2 12/06/2016   MCV 93.6 12/06/2016   PLT 287.0 12/06/2016    BMET    Component Value Date/Time   NA 135 12/15/2016 1025   K 4.6 12/06/2016 1245   CL 98 12/06/2016 1245   CO2 27 12/06/2016 1245   GLUCOSE 84 12/06/2016 1245   BUN 11 12/06/2016 1245   CREATININE 0.71 12/06/2016 1245   CALCIUM 9.6 12/06/2016 1245   CrCl cannot be calculated (Patient's most recent lab result is older than the maximum 21 days allowed.).  COAG No results found for: INR, PROTIME  Radiology No results found.   Assessment/Plan 1. Bilateral carotid artery  stenosis Recommend:  Given the patient's asymptomatic subcritical stenosis no further invasive testing or surgery at this time.  Continue antiplatelet therapy as prescribed Continue management of CAD, HTN and Hyperlipidemia Healthy heart diet,  encouraged exercise at least 4 times per week Follow up in 24 months with duplex ultrasound and physical exam based on <50% stenosis of the bilateral carotid artery   2. Renal artery stenosis ([facility]) Recommend:  Given the patient's asymptomatic subcritical stenosis no further invasive testing or surgery at this time.  Duplex ultrasound shows <  50% stenosis bilaterally.  Continue antiplatelet therapy as prescribed Continue management of CAD, HTN and Hyperlipidemia Healthy heart diet,  encouraged exercise at least 4 times per week Follow up in 24 months with duplex ultrasound and physical exam based on <50% stenosis of the bilateal renal arteries   3. Essential hypertension Continue antihypertensive medications as already ordered, these medications have been reviewed and there are no changes at this time.   4. Gastroesophageal reflux disease, esophagitis presence not specified Continue PPI as already ordered, these medications have been reviewed and there are no changes at this time.     Hortencia Pilar, MD  03/27/2017 8:29 AM

## 2017-03-28 ENCOUNTER — Other Ambulatory Visit (INDEPENDENT_AMBULATORY_CARE_PROVIDER_SITE_OTHER): Payer: Medicare Other

## 2017-03-28 DIAGNOSIS — H8109 Meniere's disease, unspecified ear: Secondary | ICD-10-CM | POA: Diagnosis not present

## 2017-03-28 LAB — BASIC METABOLIC PANEL
BUN: 13 mg/dL (ref 6–23)
CALCIUM: 9.8 mg/dL (ref 8.4–10.5)
CO2: 29 mEq/L (ref 19–32)
CREATININE: 0.78 mg/dL (ref 0.40–1.20)
Chloride: 97 mEq/L (ref 96–112)
GFR: 78.32 mL/min (ref >60.00)
Glucose, Bld: 73 mg/dL (ref 70–99)
Potassium: 4.3 mEq/L (ref 3.5–5.1)
SODIUM: 132 meq/L — AB (ref 135–145)

## 2017-03-29 ENCOUNTER — Ambulatory Visit: Payer: Self-pay | Admitting: Internal Medicine

## 2017-03-29 ENCOUNTER — Encounter: Payer: Self-pay | Admitting: Internal Medicine

## 2017-03-29 ENCOUNTER — Ambulatory Visit (INDEPENDENT_AMBULATORY_CARE_PROVIDER_SITE_OTHER): Payer: Medicare Other | Admitting: Internal Medicine

## 2017-03-29 VITALS — BP 124/76 | HR 73 | Temp 97.8°F | Resp 14 | Wt 142.2 lb

## 2017-03-29 DIAGNOSIS — M818 Other osteoporosis without current pathological fracture: Secondary | ICD-10-CM

## 2017-03-29 DIAGNOSIS — F411 Generalized anxiety disorder: Secondary | ICD-10-CM

## 2017-03-29 DIAGNOSIS — I701 Atherosclerosis of renal artery: Secondary | ICD-10-CM

## 2017-03-29 DIAGNOSIS — K219 Gastro-esophageal reflux disease without esophagitis: Secondary | ICD-10-CM | POA: Diagnosis not present

## 2017-03-29 DIAGNOSIS — I773 Arterial fibromuscular dysplasia: Secondary | ICD-10-CM

## 2017-03-29 DIAGNOSIS — M1990 Unspecified osteoarthritis, unspecified site: Secondary | ICD-10-CM

## 2017-03-29 DIAGNOSIS — M1380 Other specified arthritis, unspecified site: Secondary | ICD-10-CM

## 2017-03-29 DIAGNOSIS — I6523 Occlusion and stenosis of bilateral carotid arteries: Secondary | ICD-10-CM | POA: Diagnosis not present

## 2017-03-29 DIAGNOSIS — E871 Hypo-osmolality and hyponatremia: Secondary | ICD-10-CM | POA: Diagnosis not present

## 2017-03-29 DIAGNOSIS — E210 Primary hyperparathyroidism: Secondary | ICD-10-CM | POA: Diagnosis not present

## 2017-03-29 DIAGNOSIS — K589 Irritable bowel syndrome without diarrhea: Secondary | ICD-10-CM | POA: Diagnosis not present

## 2017-03-29 DIAGNOSIS — I10 Essential (primary) hypertension: Secondary | ICD-10-CM

## 2017-03-29 MED ORDER — PANTOPRAZOLE SODIUM 40 MG PO TBEC: 40.0000 mg | DELAYED_RELEASE_TABLET | Freq: Every day | ORAL | 3 refills | Status: DC

## 2017-03-29 NOTE — Progress Notes (Signed)
Patient ID: Victoria Moss, female   DOB: 12/14/1950, 66 y.o.   MRN: [id number]   Subjective:    Patient ID: Victoria Moss, female    DOB: 02-Jan-1951, 66 y.o.   MRN: [id number]  HPI  Patient here for a scheduled follow up.  States she is doing relatively well.  Taking citalopram.  May be helping some.  Just started.  Discussed medication. Will continue current dose.  Stays active.  No chest pain.  No sob.  No abdominal pain.  Bowels moving.  Saw vascular surgery recently.  States not due f/u for two years.  With increased acid reflux.  I stopped her omeprazole since starting citalopram.  Increased reflux despite taking ranitidine 150mg  bid.  Will have to change back to PPI.  She has f/u planned with GI.  Plans to discuss.     Past Medical History:  Diagnosis Date  . Allergy   . Arthritis   . Basal cell cancer   . Diverticulosis    H/O  . GERD (gastroesophageal reflux disease)   . Heart murmur   . IBS (irritable bowel syndrome)   . Migraine    H/O, none since stopping chocolate  . Osteoporosis   . Rheumatic fever    H/O  . Status post dilation of esophageal narrowing    Past Surgical History:  Procedure Laterality Date  . APPENDECTOMY  1982  . BREAST BIOPSY Right 05/16   axillary node- neg  . CESAREAN SECTION    . DILATION AND CURETTAGE OF UTERUS     x2  . DILATION AND CURETTAGE, DIAGNOSTIC / THERAPEUTIC     x 2  . fatty tumor     on back  . HERNIA REPAIR    . parathyroid gland one removed    . PARATHYROIDECTOMY    . SKIN CANCER EXCISION     [facility]   . TOE SURGERY Left    joint replacement bit toe  . TUBAL LIGATION    . UMBILICAL HERNIA REPAIR     Family History  Problem Relation Age of Onset  . Breast cancer Mother 20  . Hypertension Mother   . Stroke Mother        Late 27  . Arthritis Mother   . Colon polyps Mother   . Cancer Mother        Breast  . Hypertension Father   . Hyperlipidemia Father   . Diabetes Neg Hx   . Colon cancer Neg Hx     . Esophageal cancer Neg Hx   . Kidney disease Neg Hx   . Gallbladder disease Neg Hx    Social History   Socioeconomic History  . Marital status: Widowed    Spouse name: None  . Number of children: 2  . Years of education: None  . Highest education level: None  Social Needs  . Financial resource strain: None  . Food insecurity - worry: None  . Food insecurity - inability: None  . Transportation needs - medical: None  . Transportation needs - non-medical: None  Occupational History  . Occupation: Retired    Fish farm manager: OTHER  Tobacco Use  . Smoking status: Former Smoker    Years: 5.00    Last attempt to quit: 05/09/1973    Years since quitting: 43.9  . Smokeless tobacco: Never Used  . Tobacco comment: Quit at age 53  Substance and Sexual Activity  . Alcohol use: No    Alcohol/week: 0.0 oz  . Drug use:  No  . Sexual activity: No  Other Topics Concern  . None  Social History Narrative  . None    Outpatient Encounter Medications as of 03/29/2017  Medication Sig  . aspirin 81 MG tablet Take 81 mg by mouth daily.  . B Complex Vitamins (VITAMIN-B COMPLEX) TABS Take by mouth.  . Calcium Carb-Ergocalciferol 500-200 MG-UNIT TABS Take by mouth.  . Cholecalciferol (VITAMIN D3) 400 units CAPS Take by mouth.  . citalopram (CELEXA) 10 MG tablet Take 1 tablet (10 mg total) daily by mouth.  . cyclobenzaprine (FLEXERIL) 5 MG tablet TAKE 1 TABLET BY MOUTH AT BEDTIME AS NEEDED FOR MUSCLE SPASMS  . dicyclomine (BENTYL) 10 MG capsule TAKE ONE CASPULE BY MOUTH ONCE A DAY AS NEEDED AS DIRCTED BY PHYSICIAN.  Marland Kitchen DOCOSAHEXAENOIC ACID PO Take by mouth.  . hydroxychloroquine (PLAQUENIL) 200 MG tablet Take by mouth daily.  Marland Kitchen lisinopril (PRINIVIL,ZESTRIL) 20 MG tablet TAKE 1 TABLET BY MOUTH DAILY  . meloxicam (MOBIC) 7.5 MG tablet Take 7.5 mg by mouth daily.  . Multiple Vitamin (MULTI-VITAMINS) TABS Take by mouth.  . Omega-3 Fatty Acids (FISH OIL PO) Take 1 tablet by mouth daily.   . [DISCONTINUED]  ranitidine (RANITIDINE ACID REDUCER) 75 MG tablet Take 75 mg 2 (two) times daily by mouth.  . pantoprazole (PROTONIX) 40 MG tablet Take 1 tablet (40 mg total) by mouth daily.   No facility-administered encounter medications on file as of 03/29/2017.     Review of Systems  Constitutional: Negative for appetite change and unexpected weight change.  HENT: Negative for congestion and sinus pressure.   Respiratory: Negative for cough, chest tightness and shortness of breath.   Cardiovascular: Negative for chest pain, palpitations and leg swelling.  Gastrointestinal: Negative for abdominal pain, diarrhea, nausea and vomiting.  Genitourinary: Negative for difficulty urinating and dysuria.  Musculoskeletal: Negative for joint swelling and myalgias.  Skin: Negative for color change and rash.  Neurological: Negative for dizziness and headaches.  Psychiatric/Behavioral: Negative for agitation and dysphoric mood.       Increased stress.  Desires no further intervention at this time.        Objective:    Physical Exam  Constitutional: She appears well-developed and well-nourished. No distress.  HENT:  Nose: Nose normal.  Mouth/Throat: Oropharynx is clear and moist.  Neck: Neck supple. No thyromegaly present.  Cardiovascular: Normal rate and regular rhythm.  Pulmonary/Chest: Breath sounds normal. No respiratory distress. She has no wheezes.  Abdominal: Soft. Bowel sounds are normal. There is no tenderness.  Musculoskeletal: She exhibits no edema or tenderness.  Lymphadenopathy:    She has no cervical adenopathy.  Skin: No rash noted. No erythema.  Psychiatric: She has a normal mood and affect. Her behavior is normal.    BP 124/76 (BP Location: Left Arm, Patient Position: Sitting, Cuff Size: Normal)   Pulse 73   Temp 97.8 F (36.6 C) (Oral)   Resp 14   Wt 142 lb 3.2 oz (64.5 kg)   LMP 05/09/2006 (Approximate)   SpO2 99%   BMI 23.30 kg/m  Wt Readings from Last 3 Encounters:    03/29/17 142 lb 3.2 oz (64.5 kg)  03/27/17 140 lb (63.5 kg)  03/09/17 142 lb 6.4 oz (64.6 kg)     Lab Results  Component Value Date   WBC 7.5 12/06/2016   HGB 12.3 12/06/2016   HCT 37.2 12/06/2016   PLT 287.0 12/06/2016   GLUCOSE 73 03/28/2017   CHOL 155 12/16/2015   TRIG 160.0 (  H) 12/16/2015   HDL 55.00 12/16/2015   LDLCALC 68 12/16/2015   ALT 16 12/06/2016   AST 20 12/06/2016   NA 132 (L) 03/28/2017   K 4.3 03/28/2017   CL 97 03/28/2017   CREATININE 0.78 03/28/2017   BUN 13 03/28/2017   CO2 29 03/28/2017   TSH 0.35 12/06/2016   HGBA1C 5.6 12/31/2014       Assessment & Plan:   Problem List Items Addressed This Visit    Carotid stenosis    Being followed by [facility].       Fibromuscular dysplasia ([place])    Just evaluated by [facility].  Stable. Pt reports f/u recommended in 2 years.        Generalized anxiety disorder    Previously saw Dr Nicolasa Ducking.  Discussed increased stress and anxiety.  See last note.  Started on citalopram.  Tolerating.  May have helped some.  Just started.  Will continue current dose.  Follow.        GERD (gastroesophageal reflux disease)    Increased acid reflux off PPI.  On ranitidine 150mg  bid now.  Will start protonix 40mg  q day.  Continue ranitidine 150mg  before evening meal.  Has f/u planned with GI 04/2017.  Plans to discuss.        Relevant Medications   pantoprazole (PROTONIX) 40 MG tablet   Hypertension    Blood pressure as outlined.  Follow pressures.  Follow metabolic panel.  On lisinopril.        IBS (irritable bowel syndrome)    Following with GI.       Relevant Medications   pantoprazole (PROTONIX) 40 MG tablet   Inflammatory arthritis    States diagnosed with pseudogout.  On plaquenil.  Stable.        Osteoporosis    Receiving reclast.        Primary hyperparathyroidism ([place])    Seeing Dr Gale Journey.        Renal artery stenosis ([facility])    Followed by [facility].        Other Visit Diagnoses    Hyponatremia    -  Primary    Relevant Orders   Sodium       Einar Pheasant, MD

## 2017-03-29 NOTE — Patient Instructions (Signed)
Take protonix (pantoprazole) 40mg  - 30 minutes before breakfast  Take zantac (ranitidine) 150mg  - 30 minutes before your evening meal.

## 2017-04-01 ENCOUNTER — Encounter: Payer: Self-pay | Admitting: Internal Medicine

## 2017-04-01 NOTE — Assessment & Plan Note (Signed)
States diagnosed with pseudogout.  On plaquenil.  Stable.

## 2017-04-01 NOTE — Assessment & Plan Note (Signed)
Increased acid reflux off PPI.  On ranitidine 150mg  bid now.  Will start protonix 40mg  q day.  Continue ranitidine 150mg  before evening meal.  Has f/u planned with GI 04/2017.  Plans to discuss.

## 2017-04-01 NOTE — Assessment & Plan Note (Signed)
Previously saw Dr Nicolasa Ducking.  Discussed increased stress and anxiety.  See last note.  Started on citalopram.  Tolerating.  May have helped some.  Just started.  Will continue current dose.  Follow.

## 2017-04-01 NOTE — Assessment & Plan Note (Signed)
Receiving reclast.  °

## 2017-04-01 NOTE — Assessment & Plan Note (Signed)
Blood pressure as outlined.  Follow pressures.  Follow metabolic panel.  On lisinopril.

## 2017-04-01 NOTE — Assessment & Plan Note (Signed)
Following with GI. °

## 2017-04-01 NOTE — Assessment & Plan Note (Signed)
Followed by [facility].   

## 2017-04-01 NOTE — Assessment & Plan Note (Signed)
Seeing Dr Gale Journey.

## 2017-04-01 NOTE — Assessment & Plan Note (Signed)
Being followed by [facility].   

## 2017-04-01 NOTE — Assessment & Plan Note (Signed)
Just evaluated by [facility].  Stable. Pt reports f/u recommended in 2 years.

## 2017-04-02 ENCOUNTER — Encounter (INDEPENDENT_AMBULATORY_CARE_PROVIDER_SITE_OTHER): Payer: Self-pay | Admitting: Vascular Surgery

## 2017-04-12 ENCOUNTER — Other Ambulatory Visit (INDEPENDENT_AMBULATORY_CARE_PROVIDER_SITE_OTHER): Payer: Self-pay

## 2017-04-12 ENCOUNTER — Encounter: Payer: Self-pay | Admitting: Internal Medicine

## 2017-04-12 ENCOUNTER — Ambulatory Visit (INDEPENDENT_AMBULATORY_CARE_PROVIDER_SITE_OTHER): Payer: Medicare Other | Admitting: Internal Medicine

## 2017-04-12 VITALS — BP 134/84 | HR 68 | Temp 97.9°F | Resp 16 | Wt 143.0 lb

## 2017-04-12 DIAGNOSIS — R300 Dysuria: Secondary | ICD-10-CM

## 2017-04-12 DIAGNOSIS — N3000 Acute cystitis without hematuria: Secondary | ICD-10-CM | POA: Diagnosis not present

## 2017-04-12 DIAGNOSIS — I701 Atherosclerosis of renal artery: Secondary | ICD-10-CM | POA: Diagnosis not present

## 2017-04-12 LAB — POCT URINALYSIS DIPSTICK
BILIRUBIN UA: NEGATIVE
GLUCOSE UA: NEGATIVE
Ketones, UA: NEGATIVE
NITRITE UA: NEGATIVE
Protein, UA: NEGATIVE
Spec Grav, UA: 1.01 (ref 1.010–1.025)
UROBILINOGEN UA: 0.2 U/dL
pH, UA: 6 (ref 5.0–8.0)

## 2017-04-12 MED ORDER — PHENAZOPYRIDINE HCL 200 MG PO TABS: 200.0000 mg | ORAL_TABLET | Freq: Three times a day (TID) | ORAL | 0 refills | Status: DC | PRN

## 2017-04-12 MED ORDER — SULFAMETHOXAZOLE-TRIMETHOPRIM 800-160 MG PO TABS: 1.0000 | ORAL_TABLET | Freq: Two times a day (BID) | ORAL | 0 refills | Status: DC

## 2017-04-12 NOTE — Patient Instructions (Signed)
Take the antibiotic as prescribed.  Take tylenol if needed.  Increase your water intake.      Urinary Tract Infection, Adult A urinary tract infection (UTI) is an infection of any part of the urinary tract, which includes the kidneys, ureters, bladder, and urethra. These organs make, store, and get rid of urine in the body. UTI can be a bladder infection (cystitis) or kidney infection (pyelonephritis). What are the causes? This infection may be caused by fungi, viruses, or bacteria. Bacteria are the most common cause of UTIs. This condition can also be caused by repeated incomplete emptying of the bladder during urination. What increases the risk? This condition is more likely to develop if:  You ignore your need to urinate or hold urine for long periods of time.  You do not empty your bladder completely during urination.  You wipe back to front after urinating or having a bowel movement, if you are female.  You are uncircumcised, if you are female.  You are constipated.  You have a urinary catheter that stays in place (indwelling).  You have a weak defense (immune) system.  You have a medical condition that affects your bowels, kidneys, or bladder.  You have diabetes.  You take antibiotic medicines frequently or for long periods of time, and the antibiotics no longer work well against certain types of infections (antibiotic resistance).  You take medicines that irritate your urinary tract.  You are exposed to chemicals that irritate your urinary tract.  You are female.  What are the signs or symptoms? Symptoms of this condition include:  Fever.  Frequent urination or passing small amounts of urine frequently.  Needing to urinate urgently.  Pain or burning with urination.  Urine that smells bad or unusual.  Cloudy urine.  Pain in the lower abdomen or back.  Trouble urinating.  Blood in the urine.  Vomiting or being less hungry than normal.  Diarrhea or  abdominal pain.  Vaginal discharge, if you are female.  How is this diagnosed? This condition is diagnosed with a medical history and physical exam. You will also need to provide a urine sample to test your urine. Other tests may be done, including:  Blood tests.  Sexually transmitted disease (STD) testing.  If you have had more than one UTI, a cystoscopy or imaging studies may be done to determine the cause of the infections. How is this treated? Treatment for this condition often includes a combination of two or more of the following:  Antibiotic medicine.  Other medicines to treat less common causes of UTI.  Over-the-counter medicines to treat pain.  Drinking enough water to stay hydrated.  Follow these instructions at home:  Take over-the-counter and prescription medicines only as told by your health care provider.  If you were prescribed an antibiotic, take it as told by your health care provider. Do not stop taking the antibiotic even if you start to feel better.  Avoid alcohol, caffeine, tea, and carbonated beverages. They can irritate your bladder.  Drink enough fluid to keep your urine clear or pale yellow.  Keep all follow-up visits as told by your health care provider. This is important.  Make sure to: ? Empty your bladder often and completely. Do not hold urine for long periods of time. ? Empty your bladder before and after sex. ? Wipe from front to back after a bowel movement if you are female. Use each tissue one time when you wipe. Contact a health care provider if:    You have back pain.  You have a fever.  You feel nauseous or vomit.  Your symptoms do not get better after 3 days.  Your symptoms go away and then return. Get help right away if:  You have severe back pain or lower abdominal pain.  You are vomiting and cannot keep down any medicines or water. This information is not intended to replace advice given to you by your health care provider.  Make sure you discuss any questions you have with your health care provider. Document Released: 02/02/2005 Document Revised: 10/07/2015 Document Reviewed: 03/16/2015 Elsevier Interactive Patient Education  2017 Elsevier Inc.  

## 2017-04-12 NOTE — Assessment & Plan Note (Signed)
Urine dip consistent with UTI Will send urine for culture Take the antibiotic as prescribed.  Pyridium prn Take tylenol if needed.   Increase your water intake.  Call if no improvement

## 2017-04-12 NOTE — Progress Notes (Signed)
Subjective:    Patient ID: Victoria Moss, female    DOB: 10-09-1950, 66 y.o.   MRN: [id number]  HPI The patient is here for an acute visit for a possible UTI.  ?  UTI: Her symptoms started yesterday.  She is experiencing dysuria, urinary frequency.  She has some right flank pain that may be related to traveling or arthritis.  Her urine is stronger smelling and darker.  She has mild pressure in her bladder, but no abdominal pain.  She denies fevers, nausea, hematuria, nausea and back pain.      Medications and allergies reviewed with patient and updated if appropriate.  Patient Active Problem List   Diagnosis Date Noted  . Hypertension 10/22/2016  . Closed nondisplaced fracture of styloid process of right ulna 08/11/2016  . Ganglion cyst of both wrists 06/24/2016  . Carotid stenosis 03/21/2016  . Renal artery stenosis ([place]) 03/21/2016  . Generalized anxiety disorder 12/17/2015  . Primary hyperparathyroidism ([place]) 02/06/2015  . Fibromuscular dysplasia ([place]) 12/03/2014  . Insomnia 10/27/2014  . Health care maintenance 08/10/2014  . Family history of colonic polyps 07/02/2013  . Inflammatory arthritis 12/23/2012  . IBS (irritable bowel syndrome) 12/23/2012  . Diverticulosis 12/23/2012  . GERD (gastroesophageal reflux disease) 12/23/2012  . Environmental allergies 12/23/2012  . Migraine 12/23/2012  . Osteoporosis     Current Outpatient Medications on File Prior to Visit  Medication Sig Dispense Refill  . aspirin 81 MG tablet Take 81 mg by mouth daily.    . B Complex Vitamins (VITAMIN-B COMPLEX) TABS Take by mouth.    . Calcium Carb-Ergocalciferol 500-200 MG-UNIT TABS Take by mouth.    . Cholecalciferol (VITAMIN D3) 400 units CAPS Take by mouth.    . citalopram (CELEXA) 10 MG tablet Take 1 tablet (10 mg total) daily by mouth. 30 tablet 1  . cyclobenzaprine (FLEXERIL) 5 MG tablet TAKE 1 TABLET BY MOUTH AT BEDTIME AS NEEDED FOR MUSCLE SPASMS 30 tablet 0  .  dicyclomine (BENTYL) 10 MG capsule TAKE ONE CASPULE BY MOUTH ONCE A DAY AS NEEDED AS DIRCTED BY PHYSICIAN. 90 capsule 1  . DOCOSAHEXAENOIC ACID PO Take by mouth.    . hydroxychloroquine (PLAQUENIL) 200 MG tablet Take by mouth daily.    Marland Kitchen lisinopril (PRINIVIL,ZESTRIL) 20 MG tablet TAKE 1 TABLET BY MOUTH DAILY 30 tablet 3  . meloxicam (MOBIC) 7.5 MG tablet Take 7.5 mg by mouth daily.    . Multiple Vitamin (MULTI-VITAMINS) TABS Take by mouth.    . Omega-3 Fatty Acids (FISH OIL PO) Take 1 tablet by mouth daily.     . pantoprazole (PROTONIX) 40 MG tablet Take 1 tablet (40 mg total) by mouth daily. 30 tablet 3   No current facility-administered medications on file prior to visit.     Past Medical History:  Diagnosis Date  . Allergy   . Arthritis   . Basal cell cancer   . Diverticulosis    H/O  . GERD (gastroesophageal reflux disease)   . Heart murmur   . IBS (irritable bowel syndrome)   . Migraine    H/O, none since stopping chocolate  . Osteoporosis   . Rheumatic fever    H/O  . Status post dilation of esophageal narrowing     Past Surgical History:  Procedure Laterality Date  . APPENDECTOMY  1982  . BREAST BIOPSY Right 05/16   axillary node- neg  . CESAREAN SECTION    . DILATION AND CURETTAGE OF UTERUS  x2  . DILATION AND CURETTAGE, DIAGNOSTIC / THERAPEUTIC     x 2  . fatty tumor     on back  . HERNIA REPAIR    . parathyroid gland one removed    . PARATHYROIDECTOMY    . SKIN CANCER EXCISION     BCCA   . TOE SURGERY Left    joint replacement bit toe  . TUBAL LIGATION    . UMBILICAL HERNIA REPAIR      Social History   Socioeconomic History  . Marital status: Widowed    Spouse name: None  . Number of children: 2  . Years of education: None  . Highest education level: None  Social Needs  . Financial resource strain: None  . Food insecurity - worry: None  . Food insecurity - inability: None  . Transportation needs - medical: None  . Transportation needs -  non-medical: None  Occupational History  . Occupation: Retired    Fish farm manager: [employer]  Tobacco Use  . Smoking status: Former Smoker    Years: 5.00    Last attempt to quit: 05/09/1973    Years since quitting: 43.9  . Smokeless tobacco: Never Used  . Tobacco comment: Quit at age 47  Substance and Sexual Activity  . Alcohol use: No    Alcohol/week: 0.0 oz  . Drug use: No  . Sexual activity: No  Other Topics Concern  . None  Social History Narrative  . None    Family History  Problem Relation Age of Onset  . Breast cancer Mother 36  . Hypertension Mother   . Stroke Mother        Late 74  . Arthritis Mother   . Colon polyps Mother   . Cancer Mother        Breast  . Hypertension Father   . Hyperlipidemia Father   . Diabetes Neg Hx   . Colon cancer Neg Hx   . Esophageal cancer Neg Hx   . Kidney disease Neg Hx   . Gallbladder disease Neg Hx     Review of Systems  Constitutional: Negative for fever.  Gastrointestinal: Negative for nausea.  Genitourinary: Positive for dysuria, flank pain (right) and frequency. Negative for hematuria.       Urine is darker       Objective:   Vitals:   04/12/17 1422  BP: 134/84  Pulse: 68  Resp: 16  Temp: 97.9 F (36.6 C)  SpO2: 98%   Wt Readings from Last 3 Encounters:  04/12/17 143 lb (64.9 kg)  03/29/17 142 lb 3.2 oz (64.5 kg)  03/27/17 140 lb (63.5 kg)   Body mass index is 23.43 kg/m.   Physical Exam    Constitutional: Appears well-developed and well-nourished. No distress.  Abdomen: Soft, nondistended, nontender GU: No CVA tenderness Skin: Skin is warm and dry. Not diaphoretic.  Psychiatric: Normal mood and affect. Behavior is normal.      Assessment & Plan:    See Problem List for Assessment and Plan of chronic medical problems.

## 2017-04-13 LAB — URINALYSIS, ROUTINE W REFLEX MICROSCOPIC
BILIRUBIN URINE: NEGATIVE
KETONES UR: NEGATIVE
Nitrite: NEGATIVE
Total Protein, Urine: NEGATIVE
UROBILINOGEN UA: 0.2 (ref 0.0–1.0)
Urine Glucose: NEGATIVE
pH: 6 (ref 5.0–8.0)

## 2017-04-18 DIAGNOSIS — K219 Gastro-esophageal reflux disease without esophagitis: Secondary | ICD-10-CM | POA: Diagnosis not present

## 2017-04-20 ENCOUNTER — Encounter: Payer: Self-pay | Admitting: Internal Medicine

## 2017-04-20 ENCOUNTER — Other Ambulatory Visit (INDEPENDENT_AMBULATORY_CARE_PROVIDER_SITE_OTHER): Payer: Medicare Other

## 2017-04-20 ENCOUNTER — Telehealth: Payer: Self-pay

## 2017-04-20 DIAGNOSIS — E871 Hypo-osmolality and hyponatremia: Secondary | ICD-10-CM | POA: Diagnosis not present

## 2017-04-20 LAB — SODIUM: SODIUM: 139 meq/L (ref 135–145)

## 2017-04-20 NOTE — Telephone Encounter (Signed)
I have completed the form.  Not sure who to send this to.   Will need to be faxed to [facility] (rheumatology dept does these and it appears this is where she received previously.   Let me know if I need to do anything more.  Thanks.  Form placed in your chair.

## 2017-04-20 NOTE — Telephone Encounter (Signed)
Patient states that you have ordered this in the past. Her endocrinologist told her that she could have her infusion done in [place] and [place] told her that they only do them for the doctors in town. This is her 3rd infusion and she is trying to get it done. Please advise   Copied from [place]. Topic: Referral - Request >> Apr 19, 2017  2:38 PM Synthia Innocent wrote: Reason for CRM: Would like Dr Nicki Reaper to order a reclast infusion at [facility]

## 2017-04-26 NOTE — Telephone Encounter (Signed)
Form has been faxed as requested.

## 2017-04-28 DIAGNOSIS — M25532 Pain in left wrist: Secondary | ICD-10-CM | POA: Diagnosis not present

## 2017-04-28 DIAGNOSIS — M25531 Pain in right wrist: Secondary | ICD-10-CM | POA: Diagnosis not present

## 2017-04-28 DIAGNOSIS — M79643 Pain in unspecified hand: Secondary | ICD-10-CM | POA: Diagnosis not present

## 2017-04-28 DIAGNOSIS — Z7982 Long term (current) use of aspirin: Secondary | ICD-10-CM | POA: Diagnosis not present

## 2017-04-28 DIAGNOSIS — I10 Essential (primary) hypertension: Secondary | ICD-10-CM | POA: Diagnosis not present

## 2017-04-28 DIAGNOSIS — Z79899 Other long term (current) drug therapy: Secondary | ICD-10-CM | POA: Diagnosis not present

## 2017-04-28 DIAGNOSIS — R200 Anesthesia of skin: Secondary | ICD-10-CM | POA: Diagnosis not present

## 2017-04-28 DIAGNOSIS — M150 Primary generalized (osteo)arthritis: Secondary | ICD-10-CM | POA: Diagnosis not present

## 2017-04-28 DIAGNOSIS — Z6823 Body mass index (BMI) 23.0-23.9, adult: Secondary | ICD-10-CM | POA: Diagnosis not present

## 2017-04-28 DIAGNOSIS — K219 Gastro-esophageal reflux disease without esophagitis: Secondary | ICD-10-CM | POA: Diagnosis not present

## 2017-04-28 DIAGNOSIS — M1990 Unspecified osteoarthritis, unspecified site: Secondary | ICD-10-CM | POA: Diagnosis not present

## 2017-04-28 DIAGNOSIS — M064 Inflammatory polyarthropathy: Secondary | ICD-10-CM | POA: Diagnosis not present

## 2017-05-11 ENCOUNTER — Other Ambulatory Visit: Payer: Self-pay | Admitting: Internal Medicine

## 2017-05-11 ENCOUNTER — Telehealth: Payer: Self-pay | Admitting: Internal Medicine

## 2017-05-11 MED ORDER — CITALOPRAM HYDROBROMIDE 10 MG PO TABS: 10.0000 mg | ORAL_TABLET | Freq: Every day | ORAL | 1 refills | Status: DC

## 2017-05-11 NOTE — Telephone Encounter (Signed)
Pt called [facility], rheumatology dept and they Have not received these forms. Pt would like you to resend please:  AttnJuliann Pulse Fax:  [phone]

## 2017-05-11 NOTE — Telephone Encounter (Signed)
Re faxed form to [facility] rheumatology as requested [phone], patient aware.

## 2017-05-11 NOTE — Telephone Encounter (Signed)
Please advise 

## 2017-05-11 NOTE — Telephone Encounter (Signed)
Copied from [place] [id number]. Topic: Quick Communication - See Telephone Encounter >> May 11, 2017  8:50 AM Cleaster Corin, NT wrote: CRM for notification. See Telephone encounter for:   05/11/17. Pt. Calling to request a refill on me. Celexa 10 mg pt. Can be reached at   [place] [place] [address] Phone: [phone] Fax: [phone]

## 2017-05-25 DIAGNOSIS — K2900 Acute gastritis without bleeding: Secondary | ICD-10-CM | POA: Diagnosis not present

## 2017-05-25 DIAGNOSIS — K3189 Other diseases of stomach and duodenum: Secondary | ICD-10-CM | POA: Diagnosis not present

## 2017-05-25 DIAGNOSIS — K219 Gastro-esophageal reflux disease without esophagitis: Secondary | ICD-10-CM | POA: Diagnosis not present

## 2017-05-25 DIAGNOSIS — R1013 Epigastric pain: Secondary | ICD-10-CM | POA: Diagnosis not present

## 2017-05-25 DIAGNOSIS — K2950 Unspecified chronic gastritis without bleeding: Secondary | ICD-10-CM | POA: Diagnosis not present

## 2017-05-25 DIAGNOSIS — R1310 Dysphagia, unspecified: Secondary | ICD-10-CM | POA: Diagnosis not present

## 2017-05-25 DIAGNOSIS — K319 Disease of stomach and duodenum, unspecified: Secondary | ICD-10-CM | POA: Diagnosis not present

## 2017-05-25 DIAGNOSIS — K210 Gastro-esophageal reflux disease with esophagitis: Secondary | ICD-10-CM | POA: Diagnosis not present

## 2017-05-26 DIAGNOSIS — M810 Age-related osteoporosis without current pathological fracture: Secondary | ICD-10-CM | POA: Diagnosis not present

## 2017-05-31 DIAGNOSIS — D485 Neoplasm of uncertain behavior of skin: Secondary | ICD-10-CM | POA: Diagnosis not present

## 2017-05-31 DIAGNOSIS — Z08 Encounter for follow-up examination after completed treatment for malignant neoplasm: Secondary | ICD-10-CM | POA: Diagnosis not present

## 2017-05-31 DIAGNOSIS — Z85828 Personal history of other malignant neoplasm of skin: Secondary | ICD-10-CM | POA: Diagnosis not present

## 2017-05-31 DIAGNOSIS — D0472 Carcinoma in situ of skin of left lower limb, including hip: Secondary | ICD-10-CM | POA: Diagnosis not present

## 2017-06-01 ENCOUNTER — Ambulatory Visit (INDEPENDENT_AMBULATORY_CARE_PROVIDER_SITE_OTHER): Payer: Medicare Other | Admitting: Internal Medicine

## 2017-06-01 ENCOUNTER — Encounter: Payer: Self-pay | Admitting: Internal Medicine

## 2017-06-01 DIAGNOSIS — F411 Generalized anxiety disorder: Secondary | ICD-10-CM | POA: Diagnosis not present

## 2017-06-01 DIAGNOSIS — M818 Other osteoporosis without current pathological fracture: Secondary | ICD-10-CM

## 2017-06-01 DIAGNOSIS — I701 Atherosclerosis of renal artery: Secondary | ICD-10-CM

## 2017-06-01 DIAGNOSIS — E210 Primary hyperparathyroidism: Secondary | ICD-10-CM

## 2017-06-01 DIAGNOSIS — K589 Irritable bowel syndrome without diarrhea: Secondary | ICD-10-CM

## 2017-06-01 DIAGNOSIS — M1990 Unspecified osteoarthritis, unspecified site: Secondary | ICD-10-CM | POA: Diagnosis not present

## 2017-06-01 DIAGNOSIS — K219 Gastro-esophageal reflux disease without esophagitis: Secondary | ICD-10-CM

## 2017-06-01 DIAGNOSIS — I773 Arterial fibromuscular dysplasia: Secondary | ICD-10-CM | POA: Diagnosis not present

## 2017-06-01 DIAGNOSIS — I10 Essential (primary) hypertension: Secondary | ICD-10-CM

## 2017-06-01 MED ORDER — LOSARTAN POTASSIUM 100 MG PO TABS: 100.0000 mg | ORAL_TABLET | Freq: Every day | ORAL | 2 refills | Status: DC

## 2017-06-01 NOTE — Progress Notes (Signed)
Patient ID: Victoria Moss, female   DOB: 1950-09-20, 67 y.o.   MRN: [id number]   Subjective:    Patient ID: Victoria Moss, female    DOB: 04/18/1951, 67 y.o.   MRN: [id number]  HPI  Patient here for a scheduled follow up.  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  Unable to take antiinflammatories.  On plaquenil.  Due for eye exam.  Rheumatology discussed starting MTX.  She tries to stay active.  No chest pain.  No sob.  Recently saw GI.  Had EGD 05/25/17 - grade A reflux esophagitis and non bleeding erosive gastropathy.  On protonix.  Bowels stable.  Has f/u with Dawson Bills 06/07/17.  She is concerned about her blood pressure variations.  Reviewed outside blood pressure readings.  Blood pressures averaging 120-160/70-80.  She wants to change her lisinopril.  Also concerned it is contributing to her cough.  Handling stress.  Does not feel she needs to be on celexa.  Would like to try to taper off.     Past Medical History:  Diagnosis Date  . Allergy   . Arthritis   . Basal cell cancer   . Diverticulosis    H/O  . GERD (gastroesophageal reflux disease)   . Heart murmur   . IBS (irritable bowel syndrome)   . Migraine    H/O, none since stopping chocolate  . Osteoporosis   . Rheumatic fever    H/O  . Status post dilation of esophageal narrowing    Past Surgical History:  Procedure Laterality Date  . APPENDECTOMY  1982  . BREAST BIOPSY Right 05/16   axillary node- neg  . CESAREAN SECTION    . DILATION AND CURETTAGE OF UTERUS     x2  . DILATION AND CURETTAGE, DIAGNOSTIC / THERAPEUTIC     x 2  . fatty tumor     on back  . HERNIA REPAIR    . parathyroid gland one removed    . PARATHYROIDECTOMY    . SKIN CANCER EXCISION     [facility]   . TOE SURGERY Left    joint replacement bit toe  . TUBAL LIGATION    . UMBILICAL HERNIA REPAIR     Family History  Problem Relation Age of Onset  . Breast cancer Mother 70  .  Hypertension Mother   . Stroke Mother        Late 62  . Arthritis Mother   . Colon polyps Mother   . Cancer Mother        Breast  . Hypertension Father   . Hyperlipidemia Father   . Diabetes Neg Hx   . Colon cancer Neg Hx   . Esophageal cancer Neg Hx   . Kidney disease Neg Hx   . Gallbladder disease Neg Hx    Social History   Socioeconomic History  . Marital status: Widowed    Spouse name: None  . Number of children: 2  . Years of education: None  . Highest education level: None  Social Needs  . Financial resource strain: None  . Food insecurity - worry: None  . Food insecurity - inability: None  . Transportation needs - medical: None  . Transportation needs - non-medical: None  Occupational History  . Occupation: Retired    Fish farm manager: [employer]  Tobacco Use  . Smoking status: Former Smoker    Years: 5.00    Last attempt to quit: 05/09/1973  Years since quitting: 44.1  . Smokeless tobacco: Never Used  . Tobacco comment: Quit at age 6  Substance and Sexual Activity  . Alcohol use: No    Alcohol/week: 0.0 oz  . Drug use: No  . Sexual activity: No  Other Topics Concern  . None  Social History Narrative  . None    Outpatient Encounter Medications as of 06/01/2017  Medication Sig  . aspirin 81 MG tablet Take 81 mg by mouth daily.  . B Complex Vitamins (VITAMIN-B COMPLEX) TABS Take by mouth.  . Calcium Carb-Ergocalciferol 500-200 MG-UNIT TABS Take by mouth.  . Cholecalciferol (VITAMIN D3) 400 units CAPS Take by mouth.  . citalopram (CELEXA) 10 MG tablet Take 1 tablet (10 mg total) by mouth daily.  . cyclobenzaprine (FLEXERIL) 5 MG tablet TAKE 1 TABLET BY MOUTH AT BEDTIME AS NEEDED FOR MUSCLE SPASMS  . dicyclomine (BENTYL) 10 MG capsule TAKE ONE CASPULE BY MOUTH ONCE A DAY AS NEEDED AS DIRCTED BY PHYSICIAN.  Marland Kitchen DOCOSAHEXAENOIC ACID PO Take by mouth.  . hydroxychloroquine (PLAQUENIL) 200 MG tablet Take by mouth daily.  Marland Kitchen losartan (COZAAR) 100 MG tablet Take 1 tablet  (100 mg total) by mouth daily.  . meloxicam (MOBIC) 7.5 MG tablet Take 7.5 mg by mouth daily.  . Multiple Vitamin (MULTI-VITAMINS) TABS Take by mouth.  . Omega-3 Fatty Acids (FISH OIL PO) Take 1 tablet by mouth daily.   . pantoprazole (PROTONIX) 40 MG tablet Take 1 tablet (40 mg total) by mouth daily.  . phenazopyridine (PYRIDIUM) 200 MG tablet Take 1 tablet (200 mg total) by mouth 3 (three) times daily as needed for pain.  Marland Kitchen sulfamethoxazole-trimethoprim (BACTRIM DS,SEPTRA DS) 800-160 MG tablet Take 1 tablet by mouth 2 (two) times daily.  . [DISCONTINUED] lisinopril (PRINIVIL,ZESTRIL) 20 MG tablet TAKE 1 TABLET BY MOUTH DAILY   No facility-administered encounter medications on file as of 06/01/2017.     Review of Systems  Constitutional: Negative for appetite change and unexpected weight change.  HENT: Negative for congestion and sinus pressure.   Respiratory: Positive for cough. Negative for chest tightness and shortness of breath.   Cardiovascular: Negative for chest pain, palpitations and leg swelling.  Gastrointestinal: Negative for abdominal pain, diarrhea, nausea and vomiting.  Genitourinary: Negative for difficulty urinating and dysuria.  Musculoskeletal: Negative for myalgias.       Persistent joint pain.  Seeing rheumatology.  In discussions with them about starting MTX.   Skin: Negative for color change and rash.  Neurological: Negative for dizziness, light-headedness and headaches.  Psychiatric/Behavioral: Negative for agitation and dysphoric mood.       Objective:    Physical Exam  Constitutional: She appears well-developed and well-nourished. No distress.  HENT:  Nose: Nose normal.  Mouth/Throat: Oropharynx is clear and moist.  Neck: Neck supple. No thyromegaly present.  Cardiovascular: Normal rate and regular rhythm.  Pulmonary/Chest: Breath sounds normal. No respiratory distress. She has no wheezes.  Abdominal: Soft. Bowel sounds are normal. There is no tenderness.   Musculoskeletal: She exhibits no edema or tenderness.  Lymphadenopathy:    She has no cervical adenopathy.  Skin: No rash noted. No erythema.  Psychiatric: She has a normal mood and affect. Her behavior is normal.    BP 132/86 (BP Location: Left Arm, Patient Position: Sitting, Cuff Size: Normal)   Pulse 70   Temp 98.2 F (36.8 C) (Oral)   Resp 20   Wt 146 lb 3.2 oz (66.3 kg)   LMP 05/09/2006 (Approximate)  SpO2 95%   BMI 23.96 kg/m  Wt Readings from Last 3 Encounters:  06/01/17 146 lb 3.2 oz (66.3 kg)  04/12/17 143 lb (64.9 kg)  03/29/17 142 lb 3.2 oz (64.5 kg)     Lab Results  Component Value Date   WBC 7.5 12/06/2016   HGB 12.3 12/06/2016   HCT 37.2 12/06/2016   PLT 287.0 12/06/2016   GLUCOSE 73 03/28/2017   CHOL 155 12/16/2015   TRIG 160.0 (H) 12/16/2015   HDL 55.00 12/16/2015   LDLCALC 68 12/16/2015   ALT 16 12/06/2016   AST 20 12/06/2016   NA 139 04/20/2017   K 4.3 03/28/2017   CL 97 03/28/2017   CREATININE 0.78 03/28/2017   BUN 13 03/28/2017   CO2 29 03/28/2017   TSH 0.35 12/06/2016   HGBA1C 5.6 12/31/2014       Assessment & Plan:   Problem List Items Addressed This Visit    Fibromuscular dysplasia ([place])    Evaluated by [facility].  F/u recommended in 2 years.  (approximately 04/2019).        Relevant Medications   losartan (COZAAR) 100 MG tablet   Generalized anxiety disorder    Previously saw Dr Nicolasa Ducking.  On citalopram.  Does not feel needs any more.  Instructed on taper.  Follow.       GERD (gastroesophageal reflux disease)    Just had EGD as outlined.  On prtonix.  Continue f/u with GI.  Avoid antiinflammatories.       Hypertension    Blood pressure remains elevated.  Feels lisinopril is contributing to her cough.  Will change to 100mg  losartan.  Follow pressures.  Follow metabolic panel.        Relevant Medications   losartan (COZAAR) 100 MG tablet   IBS (irritable bowel syndrome)    Follow with GI.       Inflammatory arthritis     Followed by rheumatology.  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  On plaquenil.  They have discussed MTX.  Follow.       Osteoporosis    Just received reclast.  Follow bone density.        Primary hyperparathyroidism ([place])    Has been evaluated by Dr Gale Journey.  Follow calcium level.       Renal artery stenosis ([facility])    Has been evaluated by [facility] - Dr Lucky Cowboy.  Follow renal function.        Relevant Medications   losartan (COZAAR) 100 MG tablet       Einar Pheasant, MD

## 2017-06-01 NOTE — Progress Notes (Signed)
Patient ID: Victoria Moss, female   DOB: 1950/11/13, 67 y.o.   MRN: [id number]

## 2017-06-04 ENCOUNTER — Encounter: Payer: Self-pay | Admitting: Internal Medicine

## 2017-06-04 NOTE — Assessment & Plan Note (Signed)
Just had EGD as outlined.  On prtonix.  Continue f/u with GI.  Avoid antiinflammatories.

## 2017-06-04 NOTE — Assessment & Plan Note (Signed)
Has been evaluated by [facility] - Dr Lucky Cowboy.  Follow renal function.

## 2017-06-04 NOTE — Assessment & Plan Note (Signed)
Just received reclast.  Follow bone density.

## 2017-06-04 NOTE — Assessment & Plan Note (Signed)
Followed by rheumatology.  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  On plaquenil.  They have discussed MTX.  Follow.

## 2017-06-04 NOTE — Assessment & Plan Note (Signed)
Evaluated by [facility].  F/u recommended in 2 years.  (approximately 04/2019).

## 2017-06-04 NOTE — Assessment & Plan Note (Signed)
Follow with GI

## 2017-06-04 NOTE — Assessment & Plan Note (Signed)
Previously saw Dr Nicolasa Ducking.  On citalopram.  Does not feel needs any more.  Instructed on taper.  Follow.

## 2017-06-04 NOTE — Assessment & Plan Note (Signed)
Has been evaluated by Dr Gale Journey.  Follow calcium level.

## 2017-06-04 NOTE — Assessment & Plan Note (Signed)
Blood pressure remains elevated.  Feels lisinopril is contributing to her cough.  Will change to 100mg  losartan.  Follow pressures.  Follow metabolic panel.

## 2017-06-07 DIAGNOSIS — K2960 Other gastritis without bleeding: Secondary | ICD-10-CM | POA: Insufficient documentation

## 2017-06-07 DIAGNOSIS — Z8371 Family history of colonic polyps: Secondary | ICD-10-CM | POA: Diagnosis not present

## 2017-06-07 DIAGNOSIS — K219 Gastro-esophageal reflux disease without esophagitis: Secondary | ICD-10-CM | POA: Diagnosis not present

## 2017-06-08 ENCOUNTER — Encounter: Payer: Self-pay | Admitting: Internal Medicine

## 2017-06-08 NOTE — Telephone Encounter (Signed)
Ok to refill celexa x 3.   Make sure her other medications get transferred to [facility]

## 2017-06-09 ENCOUNTER — Other Ambulatory Visit: Payer: Self-pay

## 2017-06-09 MED ORDER — CITALOPRAM HYDROBROMIDE 10 MG PO TABS: 10.0000 mg | ORAL_TABLET | Freq: Every day | ORAL | 3 refills | Status: DC

## 2017-06-12 ENCOUNTER — Other Ambulatory Visit: Payer: Self-pay

## 2017-06-12 DIAGNOSIS — H43813 Vitreous degeneration, bilateral: Secondary | ICD-10-CM | POA: Diagnosis not present

## 2017-06-12 DIAGNOSIS — H4420 Degenerative myopia, unspecified eye: Secondary | ICD-10-CM | POA: Diagnosis not present

## 2017-06-12 DIAGNOSIS — M1990 Unspecified osteoarthritis, unspecified site: Secondary | ICD-10-CM | POA: Diagnosis not present

## 2017-06-12 DIAGNOSIS — Z79899 Other long term (current) drug therapy: Secondary | ICD-10-CM | POA: Insufficient documentation

## 2017-06-12 DIAGNOSIS — H5213 Myopia, bilateral: Secondary | ICD-10-CM | POA: Insufficient documentation

## 2017-06-12 MED ORDER — PANTOPRAZOLE SODIUM 40 MG PO TBEC: 40.0000 mg | DELAYED_RELEASE_TABLET | Freq: Every day | ORAL | 3 refills | Status: DC

## 2017-06-12 MED ORDER — LOSARTAN POTASSIUM 100 MG PO TABS: 100.0000 mg | ORAL_TABLET | Freq: Every day | ORAL | 2 refills | Status: DC

## 2017-06-12 MED ORDER — DICYCLOMINE HCL 10 MG PO CAPS: ORAL_CAPSULE | ORAL | 1 refills | Status: DC

## 2017-06-27 DIAGNOSIS — M1990 Unspecified osteoarthritis, unspecified site: Secondary | ICD-10-CM | POA: Diagnosis not present

## 2017-06-27 DIAGNOSIS — Z6822 Body mass index (BMI) 22.0-22.9, adult: Secondary | ICD-10-CM | POA: Diagnosis not present

## 2017-06-28 DIAGNOSIS — D0472 Carcinoma in situ of skin of left lower limb, including hip: Secondary | ICD-10-CM | POA: Diagnosis not present

## 2017-07-18 ENCOUNTER — Ambulatory Visit (INDEPENDENT_AMBULATORY_CARE_PROVIDER_SITE_OTHER): Payer: Medicare Other | Admitting: Internal Medicine

## 2017-07-18 VITALS — BP 128/80 | HR 69 | Temp 98.2°F | Resp 18 | Wt 142.6 lb

## 2017-07-18 DIAGNOSIS — I701 Atherosclerosis of renal artery: Secondary | ICD-10-CM

## 2017-07-18 DIAGNOSIS — F411 Generalized anxiety disorder: Secondary | ICD-10-CM

## 2017-07-18 DIAGNOSIS — I10 Essential (primary) hypertension: Secondary | ICD-10-CM | POA: Diagnosis not present

## 2017-07-18 DIAGNOSIS — K219 Gastro-esophageal reflux disease without esophagitis: Secondary | ICD-10-CM

## 2017-07-18 DIAGNOSIS — Z1239 Encounter for other screening for malignant neoplasm of breast: Secondary | ICD-10-CM

## 2017-07-18 DIAGNOSIS — M1380 Other specified arthritis, unspecified site: Secondary | ICD-10-CM

## 2017-07-18 DIAGNOSIS — M818 Other osteoporosis without current pathological fracture: Secondary | ICD-10-CM | POA: Diagnosis not present

## 2017-07-18 DIAGNOSIS — Z1231 Encounter for screening mammogram for malignant neoplasm of breast: Secondary | ICD-10-CM

## 2017-07-18 DIAGNOSIS — I773 Arterial fibromuscular dysplasia: Secondary | ICD-10-CM | POA: Diagnosis not present

## 2017-07-18 DIAGNOSIS — E210 Primary hyperparathyroidism: Secondary | ICD-10-CM | POA: Diagnosis not present

## 2017-07-18 DIAGNOSIS — Z79899 Other long term (current) drug therapy: Secondary | ICD-10-CM | POA: Diagnosis not present

## 2017-07-18 DIAGNOSIS — M1990 Unspecified osteoarthritis, unspecified site: Secondary | ICD-10-CM

## 2017-07-18 LAB — MAGNESIUM: Magnesium: 2.2 mg/dL (ref 1.5–2.5)

## 2017-07-18 LAB — BASIC METABOLIC PANEL
BUN: 13 mg/dL (ref 6–23)
CHLORIDE: 103 meq/L (ref 96–112)
CO2: 26 mEq/L (ref 19–32)
Calcium: 9.7 mg/dL (ref 8.4–10.5)
Creatinine, Ser: 0.73 mg/dL (ref 0.40–1.20)
GFR: 84.47 mL/min (ref >60.00)
GLUCOSE: 77 mg/dL (ref 70–99)
POTASSIUM: 4.2 meq/L (ref 3.5–5.1)
SODIUM: 138 meq/L (ref 135–145)

## 2017-07-18 LAB — VITAMIN B12: VITAMIN B 12: 805 pg/mL (ref 211–911)

## 2017-07-18 MED ORDER — AMLODIPINE BESYLATE 5 MG PO TABS: 5.0000 mg | ORAL_TABLET | Freq: Every day | ORAL | 2 refills | Status: DC

## 2017-07-18 NOTE — Progress Notes (Signed)
Patient ID: Victoria Moss, female   DOB: 1951-04-25, 67 y.o.   MRN: [id number]   Subjective:    Patient ID: Victoria Moss, female    DOB: 1951-04-17, 67 y.o.   MRN: [id number]  HPI  Patient here for a scheduled follow up. She is seeing rheumatology for inflammatory arthritis.  Was to start on MTX injections.  Decided not to take.  On plaquenil.  Also is followed by GI.  On carafate and protonix.  Reflux symptoms controlled.  No chest pain.  No sob.  No abdominal pain.  Bowels moving.  Off lisinopril.  Cough resolved.  Blood pressures overall remaining elevated.  Discussed further treatment.  On celexa.  Tapering herself off the medication.     Past Medical History:  Diagnosis Date  . Allergy   . Arthritis   . Basal cell cancer   . Diverticulosis    H/O  . GERD (gastroesophageal reflux disease)   . Heart murmur   . IBS (irritable bowel syndrome)   . Migraine    H/O, none since stopping chocolate  . Osteoporosis   . Rheumatic fever    H/O  . Status post dilation of esophageal narrowing    Past Surgical History:  Procedure Laterality Date  . APPENDECTOMY  1982  . BREAST BIOPSY Right 05/16   axillary node- neg  . CESAREAN SECTION    . DILATION AND CURETTAGE OF UTERUS     x2  . DILATION AND CURETTAGE, DIAGNOSTIC / THERAPEUTIC     x 2  . fatty tumor     on back  . HERNIA REPAIR    . parathyroid gland one removed    . PARATHYROIDECTOMY    . SKIN CANCER EXCISION     [facility]   . TOE SURGERY Left    joint replacement bit toe  . TUBAL LIGATION    . UMBILICAL HERNIA REPAIR     Family History  Problem Relation Age of Onset  . Breast cancer Mother 34  . Hypertension Mother   . Stroke Mother        Late 8  . Arthritis Mother   . Colon polyps Mother   . Cancer Mother        Breast  . Hypertension Father   . Hyperlipidemia Father   . Diabetes Neg Hx   . Colon cancer Neg Hx   . Esophageal cancer Neg Hx   . Kidney disease Neg Hx   . Gallbladder disease Neg  Hx    Social History   Socioeconomic History  . Marital status: Widowed    Spouse name: None  . Number of children: 2  . Years of education: None  . Highest education level: None  Social Needs  . Financial resource strain: None  . Food insecurity - worry: None  . Food insecurity - inability: None  . Transportation needs - medical: None  . Transportation needs - non-medical: None  Occupational History  . Occupation: Retired    Fish farm manager: [employer]  Tobacco Use  . Smoking status: Former Smoker    Years: 5.00    Last attempt to quit: 05/09/1973    Years since quitting: 44.2  . Smokeless tobacco: Never Used  . Tobacco comment: Quit at age 3  Substance and Sexual Activity  . Alcohol use: No    Alcohol/week: 0.0 oz  . Drug use: No  . Sexual activity: No  Other Topics Concern  . None  Social History Narrative  . None  Outpatient Encounter Medications as of 07/18/2017  Medication Sig  . aspirin 81 MG tablet Take 81 mg by mouth daily.  . B Complex Vitamins (VITAMIN-B COMPLEX) TABS Take by mouth.  . Calcium Carb-Ergocalciferol 500-200 MG-UNIT TABS Take by mouth.  . Cholecalciferol (VITAMIN D3) 400 units CAPS Take by mouth.  . citalopram (CELEXA) 10 MG tablet Take 1 tablet (10 mg total) by mouth daily.  . cyclobenzaprine (FLEXERIL) 5 MG tablet TAKE 1 TABLET BY MOUTH AT BEDTIME AS NEEDED FOR MUSCLE SPASMS  . dicyclomine (BENTYL) 10 MG capsule TAKE ONE CASPULE BY MOUTH ONCE A DAY AS NEEDED AS DIRCTED BY PHYSICIAN.  Marland Kitchen DOCOSAHEXAENOIC ACID PO Take by mouth.  . hydroxychloroquine (PLAQUENIL) 200 MG tablet Take by mouth daily.  Marland Kitchen losartan (COZAAR) 100 MG tablet Take 1 tablet (100 mg total) by mouth daily.  . Multiple Vitamin (MULTI-VITAMINS) TABS Take by mouth.  . Omega-3 Fatty Acids (FISH OIL PO) Take 1 tablet by mouth daily.   . pantoprazole (PROTONIX) 40 MG tablet Take 1 tablet (40 mg total) by mouth daily.  . phenazopyridine (PYRIDIUM) 200 MG tablet Take 1 tablet (200 mg total) by  mouth 3 (three) times daily as needed for pain.  Marland Kitchen sulfamethoxazole-trimethoprim (BACTRIM DS,SEPTRA DS) 800-160 MG tablet Take 1 tablet by mouth 2 (two) times daily.  Marland Kitchen amLODipine (NORVASC) 5 MG tablet Take 1 tablet (5 mg total) by mouth daily.  . meloxicam (MOBIC) 7.5 MG tablet Take 7.5 mg by mouth daily.   No facility-administered encounter medications on file as of 07/18/2017.     Review of Systems  Constitutional: Negative for appetite change and unexpected weight change.  HENT: Negative for congestion and sinus pressure.   Respiratory: Negative for cough, chest tightness and shortness of breath.   Cardiovascular: Negative for chest pain, palpitations and leg swelling.  Gastrointestinal: Negative for abdominal pain, diarrhea, nausea and vomiting.  Genitourinary: Negative for difficulty urinating and dysuria.  Musculoskeletal: Negative for myalgias.       Followed by rheumatology for her joint issues.    Skin: Negative for color change and rash.  Neurological: Negative for dizziness, light-headedness and headaches.  Psychiatric/Behavioral: Negative for agitation and dysphoric mood.       Objective:    Physical Exam  Constitutional: She appears well-developed and well-nourished. No distress.  HENT:  Nose: Nose normal.  Mouth/Throat: Oropharynx is clear and moist.  Neck: Neck supple. No thyromegaly present.  Cardiovascular: Normal rate and regular rhythm.  Pulmonary/Chest: Breath sounds normal. No respiratory distress. She has no wheezes.  Abdominal: Soft. Bowel sounds are normal. There is no tenderness.  Musculoskeletal: She exhibits no edema or tenderness.  Lymphadenopathy:    She has no cervical adenopathy.  Skin: No rash noted. No erythema.  Psychiatric: She has a normal mood and affect. Her behavior is normal.    BP 128/80 (BP Location: Left Arm, Patient Position: Sitting, Cuff Size: Normal)   Pulse 69   Temp 98.2 F (36.8 C) (Oral)   Resp 18   Wt 142 lb 9.6 oz  (64.7 kg)   LMP 05/09/2006 (Approximate)   SpO2 98%   BMI 23.37 kg/m  Wt Readings from Last 3 Encounters:  07/18/17 142 lb 9.6 oz (64.7 kg)  06/01/17 146 lb 3.2 oz (66.3 kg)  04/12/17 143 lb (64.9 kg)     Lab Results  Component Value Date   WBC 7.5 12/06/2016   HGB 12.3 12/06/2016   HCT 37.2 12/06/2016   PLT 287.0 12/06/2016  GLUCOSE 77 07/18/2017   CHOL 155 12/16/2015   TRIG 160.0 (H) 12/16/2015   HDL 55.00 12/16/2015   LDLCALC 68 12/16/2015   ALT 16 12/06/2016   AST 20 12/06/2016   NA 138 07/18/2017   K 4.2 07/18/2017   CL 103 07/18/2017   CREATININE 0.73 07/18/2017   BUN 13 07/18/2017   CO2 26 07/18/2017   TSH 0.35 12/06/2016   HGBA1C 5.6 12/31/2014       Assessment & Plan:   Problem List Items Addressed This Visit    Current use of proton pump inhibitor   Relevant Orders   Vitamin B12 (Completed)   Fibromuscular dysplasia ([place])    Evaluated by [facility].  Recommended f/u in 2 years.  (due 04/2019).        Relevant Medications   amLODipine (NORVASC) 5 MG tablet   Generalized anxiety disorder    On citalopram.  Tapering off.  Follow.        GERD (gastroesophageal reflux disease)    On protonix.  No upper symptoms reported.  Follow.        Relevant Orders   Magnesium (Completed)   Hypertension    Blood pressure still remains elevated.  Reviewed her list of readings.  Increase amlodipine to 5mg  q day.  Follow pressures.  Follow metabolic panel.        Relevant Medications   amLODipine (NORVASC) 5 MG tablet   Other Relevant Orders   Basic metabolic panel (Completed)   Inflammatory arthritis    Followed by rheumatology.  Diagnosed with CPPD.  On plaquenil.  Wanted to hold on MTX.        Osteoporosis    Received reclast.        Primary hyperparathyroidism ([place])    Has been evaluated by Dr Gale Journey.  Follow calcium level.        Renal artery stenosis ([facility])    Has been evaluated by [facility].  Follow renal function.        Relevant Medications    amLODipine (NORVASC) 5 MG tablet    Other Visit Diagnoses    Breast cancer screening    -  Primary   Relevant Orders   MM Digital Screening       Einar Pheasant, MD

## 2017-07-19 ENCOUNTER — Encounter: Payer: Self-pay | Admitting: Internal Medicine

## 2017-07-22 ENCOUNTER — Encounter: Payer: Self-pay | Admitting: Internal Medicine

## 2017-07-22 NOTE — Assessment & Plan Note (Signed)
Blood pressure still remains elevated.  Reviewed her list of readings.  Increase amlodipine to 5mg  q day.  Follow pressures.  Follow metabolic panel.

## 2017-07-22 NOTE — Assessment & Plan Note (Signed)
Followed by rheumatology.  Diagnosed with CPPD.  On plaquenil.  Wanted to hold on MTX.

## 2017-07-22 NOTE — Assessment & Plan Note (Signed)
On protonix.  No upper symptoms reported.  Follow.  ?

## 2017-07-22 NOTE — Assessment & Plan Note (Signed)
Has been evaluated by [facility].  Follow renal function.  

## 2017-07-22 NOTE — Assessment & Plan Note (Signed)
On citalopram.  Tapering off.  Follow.

## 2017-07-22 NOTE — Assessment & Plan Note (Signed)
Has been evaluated by Dr Gale Journey.  Follow calcium level.

## 2017-07-22 NOTE — Assessment & Plan Note (Signed)
Received reclast.   

## 2017-07-22 NOTE — Assessment & Plan Note (Signed)
Evaluated by [facility].  Recommended f/u in 2 years.  (due 04/2019).

## 2017-08-02 ENCOUNTER — Ambulatory Visit (INDEPENDENT_AMBULATORY_CARE_PROVIDER_SITE_OTHER): Payer: Medicare Other

## 2017-08-02 ENCOUNTER — Encounter: Payer: Self-pay | Admitting: Podiatry

## 2017-08-02 ENCOUNTER — Ambulatory Visit (INDEPENDENT_AMBULATORY_CARE_PROVIDER_SITE_OTHER): Payer: Medicare Other | Admitting: Podiatry

## 2017-08-02 DIAGNOSIS — M7751 Other enthesopathy of right foot: Secondary | ICD-10-CM

## 2017-08-02 DIAGNOSIS — I701 Atherosclerosis of renal artery: Secondary | ICD-10-CM | POA: Diagnosis not present

## 2017-08-02 DIAGNOSIS — M778 Other enthesopathies, not elsewhere classified: Secondary | ICD-10-CM

## 2017-08-02 DIAGNOSIS — M2011 Hallux valgus (acquired), right foot: Secondary | ICD-10-CM

## 2017-08-02 DIAGNOSIS — M779 Enthesopathy, unspecified: Secondary | ICD-10-CM

## 2017-08-02 NOTE — Progress Notes (Signed)
She presents today chief complaint of pain to the right first metatarsal phalangeal joint.  She states that he knows about time that I get this fixed but has a caregiver from appearance I am unable to do so at this time.  She is about to go on a trip and let like to consider an injection to her great toe.  Objective: Vital signs are stable she is alert and oriented x3.  She has pain on range of motion of the first metatarsophalangeal joint of the right foot pulses remain palpable no change in neurovascular status.  Muscle strength is normal symmetrical.  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  Some osteoarthritic changes dorsal of the foot at the tarsometatarsal junction.  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  Plan: After sterile Betadine skin prep I injected 20 mg Kenalog 5 mg Marcaine through a 30-gauge needle into the first metatarsophalangeal joint of the right foot.  Tolerated procedure well without complications.  We will follow-up with her in late fall for surgery.

## 2017-08-10 DIAGNOSIS — M25531 Pain in right wrist: Secondary | ICD-10-CM | POA: Diagnosis not present

## 2017-08-10 DIAGNOSIS — Z6822 Body mass index (BMI) 22.0-22.9, adult: Secondary | ICD-10-CM | POA: Diagnosis not present

## 2017-08-10 DIAGNOSIS — Q798 Other congenital malformations of musculoskeletal system: Secondary | ICD-10-CM | POA: Diagnosis not present

## 2017-08-10 DIAGNOSIS — M06031 Rheumatoid arthritis without rheumatoid factor, right wrist: Secondary | ICD-10-CM | POA: Diagnosis not present

## 2017-08-10 DIAGNOSIS — Z7982 Long term (current) use of aspirin: Secondary | ICD-10-CM | POA: Diagnosis not present

## 2017-08-10 DIAGNOSIS — M1990 Unspecified osteoarthritis, unspecified site: Secondary | ICD-10-CM | POA: Diagnosis not present

## 2017-08-10 DIAGNOSIS — I10 Essential (primary) hypertension: Secondary | ICD-10-CM | POA: Diagnosis not present

## 2017-08-10 DIAGNOSIS — M810 Age-related osteoporosis without current pathological fracture: Secondary | ICD-10-CM | POA: Diagnosis not present

## 2017-08-11 DIAGNOSIS — G43109 Migraine with aura, not intractable, without status migrainosus: Secondary | ICD-10-CM | POA: Diagnosis not present

## 2017-08-29 ENCOUNTER — Ambulatory Visit (INDEPENDENT_AMBULATORY_CARE_PROVIDER_SITE_OTHER): Payer: Medicare Other | Admitting: Internal Medicine

## 2017-08-29 ENCOUNTER — Encounter: Payer: Self-pay | Admitting: Internal Medicine

## 2017-08-29 DIAGNOSIS — E210 Primary hyperparathyroidism: Secondary | ICD-10-CM

## 2017-08-29 DIAGNOSIS — M1990 Unspecified osteoarthritis, unspecified site: Secondary | ICD-10-CM

## 2017-08-29 DIAGNOSIS — I10 Essential (primary) hypertension: Secondary | ICD-10-CM

## 2017-08-29 DIAGNOSIS — I773 Arterial fibromuscular dysplasia: Secondary | ICD-10-CM | POA: Diagnosis not present

## 2017-08-29 DIAGNOSIS — D171 Benign lipomatous neoplasm of skin and subcutaneous tissue of trunk: Secondary | ICD-10-CM

## 2017-08-29 DIAGNOSIS — I701 Atherosclerosis of renal artery: Secondary | ICD-10-CM | POA: Diagnosis not present

## 2017-08-29 DIAGNOSIS — I6523 Occlusion and stenosis of bilateral carotid arteries: Secondary | ICD-10-CM

## 2017-08-29 DIAGNOSIS — K219 Gastro-esophageal reflux disease without esophagitis: Secondary | ICD-10-CM

## 2017-08-29 MED ORDER — CYCLOBENZAPRINE HCL 5 MG PO TABS: ORAL_TABLET | ORAL | 0 refills | Status: DC

## 2017-08-29 NOTE — Progress Notes (Signed)
Patient ID: Synethia Endicott, female   DOB: 06/10/1950, 67 y.o.   MRN: [id number]   Subjective:    Patient ID: Arihana Ambrocio, female    DOB: 1950-06-18, 67 y.o.   MRN: [id number]  HPI  Patient here for a scheduled follow up.  Seeing podiatry for persistent foot pain.  Diagnosed with capsulitis osteoarthritis.  S/p injection.  Also recently evaluated by ophthalmology after noticing flashes of light and floater.  Continues f/u with ophthalmology.  Also just saw rheumatology for inflammatory arthritis.  Off plaquenil.  Taking tumeric now.  Feels this is helping.  No increased pain.  Right wrist still swollen.  If she rotates her right forearm, increased pain.  Tries to stay active.  No chest pain.  No sob.  No acid reflux. No abdominal pain.  Bowels moving.  Does have fatty tumor - left mid back.  Wants referral to surgery for evaluation and removal.  Some increased pain at times.  Has been removed on two previous occasions.  Blood pressures attached.  Improving.  Most readings recently 110-130s/70-80s.     Past Medical History:  Diagnosis Date  . Allergy   . Arthritis   . Basal cell cancer   . Diverticulosis    H/O  . GERD (gastroesophageal reflux disease)   . Heart murmur   . IBS (irritable bowel syndrome)   . Migraine    H/O, none since stopping chocolate  . Osteoporosis   . Rheumatic fever    H/O  . Status post dilation of esophageal narrowing    Past Surgical History:  Procedure Laterality Date  . APPENDECTOMY  1982  . BREAST BIOPSY Right 05/16   axillary node- neg  . CESAREAN SECTION    . DILATION AND CURETTAGE OF UTERUS     x2  . DILATION AND CURETTAGE, DIAGNOSTIC / THERAPEUTIC     x 2  . fatty tumor     on back  . HERNIA REPAIR    . parathyroid gland one removed    . PARATHYROIDECTOMY    . SKIN CANCER EXCISION     [facility]   . TOE SURGERY Left    joint replacement bit toe  . TUBAL LIGATION    . UMBILICAL HERNIA REPAIR     Family History  Problem  Relation Age of Onset  . Breast cancer Mother 48  . Hypertension Mother   . Stroke Mother        Late 44  . Arthritis Mother   . Colon polyps Mother   . Cancer Mother        Breast  . Hypertension Father   . Hyperlipidemia Father   . Diabetes Neg Hx   . Colon cancer Neg Hx   . Esophageal cancer Neg Hx   . Kidney disease Neg Hx   . Gallbladder disease Neg Hx    Social History   Socioeconomic History  . Marital status: Widowed    Spouse name: Not on file  . Number of children: 2  . Years of education: Not on file  . Highest education level: Not on file  Occupational History  . Occupation: Retired    Fish farm manager: OTHER  Social Needs  . Financial resource strain: Not on file  . Food insecurity:    Worry: Not on file    Inability: Not on file  . Transportation needs:    Medical: Not on file    Non-medical: Not on file  Tobacco Use  . Smoking status:  Former Smoker    Years: 5.00    Last attempt to quit: 05/09/1973    Years since quitting: 44.3  . Smokeless tobacco: Never Used  . Tobacco comment: Quit at age 7  Substance and Sexual Activity  . Alcohol use: No    Alcohol/week: 0.0 oz  . Drug use: No  . Sexual activity: Never  Lifestyle  . Physical activity:    Days per week: Not on file    Minutes per session: Not on file  . Stress: Not on file  Relationships  . Social connections:    Talks on phone: Not on file    Gets together: Not on file    Attends religious service: Not on file    Active member of club or organization: Not on file    Attends meetings of clubs or organizations: Not on file    Relationship status: Not on file  Other Topics Concern  . Not on file  Social History Narrative  . Not on file    Outpatient Encounter Medications as of 08/29/2017  Medication Sig  . amLODipine (NORVASC) 5 MG tablet Take 1 tablet (5 mg total) by mouth daily.  Marland Kitchen aspirin 81 MG tablet Take 81 mg by mouth daily.  . B Complex Vitamins (VITAMIN-B COMPLEX) TABS Take by  mouth.  . Calcium Carb-Ergocalciferol 500-200 MG-UNIT TABS Take by mouth.  . Cholecalciferol (VITAMIN D3) 400 units CAPS Take by mouth.  . cyclobenzaprine (FLEXERIL) 5 MG tablet TAKE 1 TABLET BY MOUTH AT BEDTIME AS NEEDED FOR MUSCLE SPASMS  . dicyclomine (BENTYL) 10 MG capsule TAKE ONE CASPULE BY MOUTH ONCE A DAY AS NEEDED AS DIRCTED BY PHYSICIAN.  Marland Kitchen DOCOSAHEXAENOIC ACID PO Take by mouth.  . hydroxychloroquine (PLAQUENIL) 200 MG tablet Take by mouth daily.  Marland Kitchen losartan (COZAAR) 100 MG tablet Take 1 tablet (100 mg total) by mouth daily.  . Multiple Vitamin (MULTI-VITAMINS) TABS Take by mouth.  . Omega-3 Fatty Acids (FISH OIL PO) Take 1 tablet by mouth daily.   . pantoprazole (PROTONIX) 40 MG tablet Take 1 tablet (40 mg total) by mouth daily.  . [DISCONTINUED] cyclobenzaprine (FLEXERIL) 5 MG tablet TAKE 1 TABLET BY MOUTH AT BEDTIME AS NEEDED FOR MUSCLE SPASMS  . [DISCONTINUED] citalopram (CELEXA) 10 MG tablet Take 1 tablet (10 mg total) by mouth daily.  . [DISCONTINUED] meloxicam (MOBIC) 7.5 MG tablet Take 7.5 mg by mouth daily.  . [DISCONTINUED] phenazopyridine (PYRIDIUM) 200 MG tablet Take 1 tablet (200 mg total) by mouth 3 (three) times daily as needed for pain.   No facility-administered encounter medications on file as of 08/29/2017.     Review of Systems  Constitutional: Negative for appetite change and unexpected weight change.  HENT: Negative for congestion and sinus pressure.   Respiratory: Negative for cough, chest tightness and shortness of breath.   Cardiovascular: Negative for chest pain, palpitations and leg swelling.  Gastrointestinal: Negative for abdominal pain, diarrhea, nausea and vomiting.  Genitourinary: Negative for difficulty urinating and dysuria.  Musculoskeletal:       Increased pain and swelling - right wrist.  Joint pains.  Taking tumeric.  Feels helping.    Skin: Negative for color change and rash.  Neurological: Negative for dizziness, light-headedness and  headaches.  Psychiatric/Behavioral: Negative for agitation and dysphoric mood.       Objective:     Blood pressure rechecked by me:  128/82  Physical Exam  Constitutional: She appears well-developed and well-nourished. No distress.  HENT:  Nose: Nose  normal.  Mouth/Throat: Oropharynx is clear and moist.  Neck: Neck supple. No thyromegaly present.  Cardiovascular: Normal rate and regular rhythm.  Pulmonary/Chest: Breath sounds normal. No respiratory distress. She has no wheezes.  Abdominal: Soft. Bowel sounds are normal. There is no tenderness.  Musculoskeletal: She exhibits no edema or tenderness.  Right wrist swelling and tenderness to palpation. No erythema.  Lipoma - left mid back.   Lymphadenopathy:    She has no cervical adenopathy.  Skin: No rash noted. No erythema.  Psychiatric: She has a normal mood and affect. Her behavior is normal.    BP 128/82   Pulse (!) 59   Temp 97.9 F (36.6 C) (Oral)   Resp 18   Wt 138 lb 3.2 oz (62.7 kg)   LMP 05/09/2006 (Approximate)   SpO2 96%   BMI 22.65 kg/m  Wt Readings from Last 3 Encounters:  08/29/17 138 lb 3.2 oz (62.7 kg)  07/18/17 142 lb 9.6 oz (64.7 kg)  06/01/17 146 lb 3.2 oz (66.3 kg)     Lab Results  Component Value Date   WBC 7.5 12/06/2016   HGB 12.3 12/06/2016   HCT 37.2 12/06/2016   PLT 287.0 12/06/2016   GLUCOSE 77 07/18/2017   CHOL 155 12/16/2015   TRIG 160.0 (H) 12/16/2015   HDL 55.00 12/16/2015   LDLCALC 68 12/16/2015   ALT 16 12/06/2016   AST 20 12/06/2016   NA 138 07/18/2017   K 4.2 07/18/2017   CL 103 07/18/2017   CREATININE 0.73 07/18/2017   BUN 13 07/18/2017   CO2 26 07/18/2017   TSH 0.35 12/06/2016   HGBA1C 5.6 12/31/2014       Assessment & Plan:   Problem List Items Addressed This Visit    Carotid stenosis    Followed by [facility].       Fibromuscular dysplasia ([place])    Evaluated by [facility].  Recommended f/u in two years.  (due f/u 04/2019).        GERD (gastroesophageal reflux  disease)    Controlled on protonix.  Follow.        Hypertension    Blood pressure rechecked improved.  Reviewed her outside readings.  Improved.  Same medication regimen.  Follow.        Relevant Orders   CBC with Differential/Platelet   Hepatic function panel   Lipid panel   TSH   Basic metabolic panel   Inflammatory arthritis    Just evaluated by rheumatology.  Diagnosed with CPPD.  Off plaquenil.  Did not start MTX.  On tumeric.  Desires no further intervention.  Follow.        Relevant Medications   cyclobenzaprine (FLEXERIL) 5 MG tablet   Lipoma of back    Soft tissue mass - left mid back.  Appears to be c/w lipoma.  Request referral to surgery.        Relevant Orders   Ambulatory referral to General Surgery   Primary hyperparathyroidism [facility])    Has been evaluated by Dr Gale Journey.  Follow calcium.       Relevant Orders   VITAMIN D 25 Hydroxy (Vit-D Deficiency, Fractures)   Renal artery stenosis ([place])    Evaluated by [facility].  Follow renal function.            Einar Pheasant, MD

## 2017-09-02 ENCOUNTER — Encounter: Payer: Self-pay | Admitting: Internal Medicine

## 2017-09-02 DIAGNOSIS — D171 Benign lipomatous neoplasm of skin and subcutaneous tissue of trunk: Secondary | ICD-10-CM | POA: Insufficient documentation

## 2017-09-02 NOTE — Assessment & Plan Note (Signed)
Evaluated by [facility].  Recommended f/u in two years.  (due f/u 04/2019).

## 2017-09-02 NOTE — Assessment & Plan Note (Signed)
Followed by [facility].   

## 2017-09-02 NOTE — Assessment & Plan Note (Signed)
Evaluated by [facility].  Follow renal function.   

## 2017-09-02 NOTE — Assessment & Plan Note (Signed)
Soft tissue mass - left mid back.  Appears to be c/w lipoma.  Request referral to surgery.

## 2017-09-02 NOTE — Assessment & Plan Note (Signed)
Blood pressure rechecked improved.  Reviewed her outside readings.  Improved.  Same medication regimen.  Follow.

## 2017-09-02 NOTE — Assessment & Plan Note (Signed)
Just evaluated by rheumatology.  Diagnosed with CPPD.  Off plaquenil.  Did not start MTX.  On tumeric.  Desires no further intervention.  Follow.

## 2017-09-02 NOTE — Assessment & Plan Note (Signed)
Controlled on protonix.  Follow.   

## 2017-09-02 NOTE — Assessment & Plan Note (Signed)
Has been evaluated by Dr Gale Journey.  Follow calcium.

## 2017-09-04 ENCOUNTER — Encounter: Payer: Self-pay | Admitting: General Surgery

## 2017-09-06 ENCOUNTER — Ambulatory Visit
Admission: RE | Admit: 2017-09-06 | Discharge: 2017-09-06 | Disposition: A | Discharge status: Home / Self Care | Payer: Medicare Other | Source: Ambulatory Visit | Attending: Internal Medicine | Admitting: Internal Medicine

## 2017-09-06 DIAGNOSIS — Z1239 Encounter for other screening for malignant neoplasm of breast: Secondary | ICD-10-CM

## 2017-09-06 DIAGNOSIS — Z1231 Encounter for screening mammogram for malignant neoplasm of breast: Secondary | ICD-10-CM | POA: Insufficient documentation

## 2017-09-11 ENCOUNTER — Other Ambulatory Visit: Payer: Self-pay | Admitting: Internal Medicine

## 2017-10-03 ENCOUNTER — Encounter: Payer: Self-pay | Admitting: General Surgery

## 2017-10-03 ENCOUNTER — Ambulatory Visit (INDEPENDENT_AMBULATORY_CARE_PROVIDER_SITE_OTHER): Payer: Medicare Other | Admitting: General Surgery

## 2017-10-03 VITALS — BP 130/70 | HR 74 | Resp 14 | Ht 65.75 in | Wt 141.0 lb

## 2017-10-03 DIAGNOSIS — I701 Atherosclerosis of renal artery: Secondary | ICD-10-CM

## 2017-10-03 DIAGNOSIS — D216 Benign neoplasm of connective and other soft tissue of trunk, unspecified: Secondary | ICD-10-CM

## 2017-10-03 DIAGNOSIS — D171 Benign lipomatous neoplasm of skin and subcutaneous tissue of trunk: Secondary | ICD-10-CM

## 2017-10-03 NOTE — Patient Instructions (Addendum)
The patient is aware to call back for any questions or concerns.  Discussed options of excision of back lipoma with Same Day Surgery

## 2017-10-03 NOTE — Progress Notes (Signed)
Patient ID: Victoria Moss, female   DOB: 1950/10/11, 67 y.o.   MRN: [id number]  Chief Complaint  Patient presents with  . Lipoma    HPI Victoria Moss is a 67 y.o. female.  Here for evaluation of a lipoma on her back referred by Dr Nicki Reaper. She states it has been removed twice and grown back each time. She states it is tender to touch. She does not think it has gotten significantly larger over the last year, but more symptomatic with direct pressure.   HPI  Past Medical History:  Diagnosis Date  . Allergy   . Arthritis   . Basal cell cancer   . Diverticulosis    H/O  . GERD (gastroesophageal reflux disease)   . Heart murmur   . IBS (irritable bowel syndrome)   . Migraine    H/O, none since stopping chocolate  . Osteoporosis   . Rheumatic fever    H/O  . Status post dilation of esophageal narrowing     Past Surgical History:  Procedure Laterality Date  . APPENDECTOMY  1982  . BREAST BIOPSY Right 05/16   axillary node- neg  . CESAREAN SECTION    . COLONOSCOPY  2018  . DILATION AND CURETTAGE OF UTERUS     x2  . DILATION AND CURETTAGE, DIAGNOSTIC / THERAPEUTIC     x 2  . fatty tumor     on back x2  . HERNIA REPAIR    . parathyroid gland one removed    . PARATHYROIDECTOMY    . SKIN CANCER EXCISION     [facility]   . TOE SURGERY Left    joint replacement bit toe  . TUBAL LIGATION    . UMBILICAL HERNIA REPAIR    . UPPER GI ENDOSCOPY      Family History  Problem Relation Age of Onset  . Breast cancer Mother 77  . Hypertension Mother   . Stroke Mother        Late 55  . Arthritis Mother   . Colon polyps Mother   . Cancer Mother        Breast  . Hypertension Father   . Hyperlipidemia Father   . Dementia Father   . Diabetes Neg Hx   . Colon cancer Neg Hx   . Esophageal cancer Neg Hx   . Kidney disease Neg Hx   . Gallbladder disease Neg Hx     Social History Social History   Tobacco Use  . Smoking status: Former Smoker    Years: 7.00   Last attempt to quit: 05/09/1973    Years since quitting: 44.4  . Smokeless tobacco: Never Used  . Tobacco comment: Quit at age 5  Substance Use Topics  . Alcohol use: No    Alcohol/week: 0.0 oz  . Drug use: No    Allergies  Allergen Reactions  . Codeine Itching  . Lac Bovis Diarrhea    Other reaction(s): Abdominal Pain  . Penicillins Itching  . Propofol Nausea And Vomiting    Current Outpatient Medications  Medication Sig Dispense Refill  . amLODipine (NORVASC) 5 MG tablet TAKE 1 TABLET BY MOUTH DAILY 30 tablet 2  . B Complex Vitamins (VITAMIN-B COMPLEX) TABS Take by mouth.    . Calcium Carb-Ergocalciferol 500-200 MG-UNIT TABS Take by mouth.    . Cholecalciferol (VITAMIN D3) 400 units CAPS Take by mouth.    . dicyclomine (BENTYL) 10 MG capsule TAKE ONE CASPULE BY MOUTH ONCE A DAY AS NEEDED  AS DIRCTED BY PHYSICIAN. 90 capsule 1  . losartan (COZAAR) 100 MG tablet Take 1 tablet (100 mg total) by mouth daily. 30 tablet 2  . Multiple Vitamin (MULTI-VITAMINS) TABS Take by mouth.    . Omega-3 Fatty Acids (FISH OIL PO) Take 1 tablet by mouth daily.     . pantoprazole (PROTONIX) 40 MG tablet Take 1 tablet (40 mg total) by mouth daily. 30 tablet 3  . cyclobenzaprine (FLEXERIL) 5 MG tablet TAKE 1 TABLET BY MOUTH AT BEDTIME AS NEEDED FOR MUSCLE SPASMS (Patient not taking: Reported on 10/03/2017) 30 tablet 0   No current facility-administered medications for this visit.     Review of Systems Review of Systems  Constitutional: Negative.   Respiratory: Negative.   Cardiovascular: Negative.     Blood pressure 130/70, pulse 74, resp. rate 14, height 5' 5.75" (1.67 m), weight 141 lb (64 kg), last menstrual period 05/09/2006.  Physical Exam Physical Exam  Constitutional: She appears well-developed and well-nourished.  HENT:  Mouth/Throat: No oropharyngeal exudate.  Eyes: Conjunctivae are normal. No scleral icterus.  Neck: Neck supple.  Cardiovascular: Normal rate, regular rhythm and  normal heart sounds.  Pulmonary/Chest: Effort normal and breath sounds normal.      This area measured 5 cm at the time of her 2016 exam.  Skin: Skin is warm and dry.  Left back at T10 a 5 x 8 cm lipoma  Psychiatric: Her behavior is normal.       Assessment    Recurrent lipoma.    Plan    Discussed options of excision of back lipoma with Same Day Surgery  The patient is the primary caregiver for her parents were 13 and 31.  She will have to coordinate surgery to make sure that her parents are cared for.  HPI, Physical Exam, Assessment and Plan have been scribed under the direction and in the presence of [name], MD. Victoria Fetch, RN  I have completed the exam and reviewed the above documentation for accuracy and completeness.  I agree with the above.  [employer] has been used and any errors in dictation or transcription are unintentional.  Hervey Ard, M.D., F.A.C.S. . [name] 10/03/2017, 4:55 PM

## 2017-10-23 ENCOUNTER — Ambulatory Visit: Payer: Self-pay

## 2017-10-25 ENCOUNTER — Other Ambulatory Visit: Payer: Self-pay | Admitting: Internal Medicine

## 2017-11-16 DIAGNOSIS — Z85828 Personal history of other malignant neoplasm of skin: Secondary | ICD-10-CM | POA: Diagnosis not present

## 2017-11-16 DIAGNOSIS — D2272 Melanocytic nevi of left lower limb, including hip: Secondary | ICD-10-CM | POA: Diagnosis not present

## 2017-11-16 DIAGNOSIS — D2271 Melanocytic nevi of right lower limb, including hip: Secondary | ICD-10-CM | POA: Diagnosis not present

## 2017-11-16 DIAGNOSIS — D2261 Melanocytic nevi of right upper limb, including shoulder: Secondary | ICD-10-CM | POA: Diagnosis not present

## 2017-11-16 DIAGNOSIS — D225 Melanocytic nevi of trunk: Secondary | ICD-10-CM | POA: Diagnosis not present

## 2017-11-16 DIAGNOSIS — D2262 Melanocytic nevi of left upper limb, including shoulder: Secondary | ICD-10-CM | POA: Diagnosis not present

## 2017-11-16 DIAGNOSIS — Z08 Encounter for follow-up examination after completed treatment for malignant neoplasm: Secondary | ICD-10-CM | POA: Diagnosis not present

## 2017-11-20 ENCOUNTER — Encounter: Payer: Self-pay | Admitting: Obstetrics and Gynecology

## 2017-11-20 ENCOUNTER — Ambulatory Visit (INDEPENDENT_AMBULATORY_CARE_PROVIDER_SITE_OTHER): Payer: Medicare Other | Admitting: Obstetrics and Gynecology

## 2017-11-20 VITALS — BP 126/80 | HR 74 | Ht 65.0 in | Wt 144.0 lb

## 2017-11-20 DIAGNOSIS — Z124 Encounter for screening for malignant neoplasm of cervix: Secondary | ICD-10-CM

## 2017-11-20 DIAGNOSIS — Z01419 Encounter for gynecological examination (general) (routine) without abnormal findings: Secondary | ICD-10-CM | POA: Diagnosis not present

## 2017-11-20 NOTE — Progress Notes (Signed)
67 y.o. [id number] WidowedCaucasianF here for annual exam.  No vaginal bleeding. Not sexually active.  She has been dealing with arthritis issues. Had to go off of NSAID's secondary to ulcers. On Protonix, no stomach c/o. Mother died 6 weeks ago. Dad is in [place], [place].     Patient's last menstrual period was 05/09/2006 (approximate).          Sexually active: No.  The current method of family planning is none.    Exercising: Yes.    walking Smoker: former  Health Maintenance: Pap: 11/17/2016 neg.          08/07/14 neg. HR HPV:neg  History of abnormal Pap:  no MMG:  09/06/17 BIRADS1:neg  Colonoscopy:  05/24/16 normal. f/u 5 years  BMD: 10/28/15 Osteoporosis in her spine, osteopenia in her hips. She is on Reclast, followed by Endocrinology.  TDaP:  08/2014     reports that she quit smoking about 44 years ago. She quit after 7.00 years of use. She has never used smokeless tobacco. She reports that she does not drink alcohol or use drugs. One daughters live in [place], one in [place] with her young grandson.  Past Medical History:  Diagnosis Date  . Allergy   . Arthritis   . Basal cell cancer   . Diverticulosis    H/O  . GERD (gastroesophageal reflux disease)   . Heart murmur   . IBS (irritable bowel syndrome)   . Migraine    H/O, none since stopping chocolate  . Osteoporosis   . Rheumatic fever    H/O  . Status post dilation of esophageal narrowing     Past Surgical History:  Procedure Laterality Date  . APPENDECTOMY  1982  . BREAST BIOPSY Right 05/16   axillary node- neg  . CESAREAN SECTION    . COLONOSCOPY  2018  . DILATION AND CURETTAGE OF UTERUS     x2  . DILATION AND CURETTAGE, DIAGNOSTIC / THERAPEUTIC     x 2  . fatty tumor     on back x2  . HERNIA REPAIR    . parathyroid gland one removed    . PARATHYROIDECTOMY    . SKIN CANCER EXCISION     B[facility]   . TOE SURGERY Left    joint replacement bit toe  . TUBAL LIGATION    . UMBILICAL HERNIA REPAIR    . UPPER GI  ENDOSCOPY      Current Outpatient Medications  Medication Sig Dispense Refill  . amLODipine (NORVASC) 5 MG tablet TAKE 1 TABLET BY MOUTH DAILY 30 tablet 2  . B Complex Vitamins (VITAMIN-B COMPLEX) TABS Take by mouth.    . Calcium Carb-Ergocalciferol 500-200 MG-UNIT TABS Take by mouth.    . Cholecalciferol (VITAMIN D3) 400 units CAPS Take by mouth.    . cyclobenzaprine (FLEXERIL) 5 MG tablet TAKE 1 TABLET BY MOUTH AT BEDTIME AS NEEDED FOR MUSCLE SPASMS (Patient not taking: Reported on 10/03/2017) 30 tablet 0  . dicyclomine (BENTYL) 10 MG capsule TAKE ONE CASPULE BY MOUTH ONCE A DAY AS NEEDED AS DIRCTED BY PHYSICIAN. 90 capsule 1  . losartan (COZAAR) 100 MG tablet TAKE ONE TABLET BY MOUTH EVERY DAY 30 tablet 2  . Multiple Vitamin (MULTI-VITAMINS) TABS Take by mouth.    . Omega-3 Fatty Acids (FISH OIL PO) Take 1 tablet by mouth daily.     . pantoprazole (PROTONIX) 40 MG tablet Take 1 tablet (40 mg total) by mouth daily. 30 tablet 3   No current  facility-administered medications for this visit.     Family History  Problem Relation Age of Onset  . Breast cancer Mother 45  . Hypertension Mother   . Stroke Mother        Late 76  . Arthritis Mother   . Colon polyps Mother   . Cancer Mother        Breast  . Hypertension Father   . Hyperlipidemia Father   . Dementia Father   . Diabetes Neg Hx   . Colon cancer Neg Hx   . Esophageal cancer Neg Hx   . Kidney disease Neg Hx   . Gallbladder disease Neg Hx   Father had a MI in the last few months, 82, has dementia.  Mom died of a stroke  Review of Systems  Constitutional: Negative.   HENT: Negative.   Eyes: Negative.   Respiratory: Negative.   Cardiovascular: Negative.   Gastrointestinal: Negative.   Endocrine: Negative.   Genitourinary: Negative.   Musculoskeletal: Positive for arthralgias.  Skin: Negative.   Allergic/Immunologic: Negative.   Neurological: Negative.   Hematological: Negative.   Psychiatric/Behavioral: Negative.      Exam:   LMP 05/09/2006 (Approximate)   Weight change: @WEIGHTCHANGE @ Height:      Ht Readings from Last 3 Encounters:  10/03/17 5' 5.75" (1.67 m)  03/27/17 5' 5.5" (1.664 m)  03/09/17 5\' 6"  (1.676 m)    General appearance: alert, cooperative and appears stated age Head: Normocephalic, without obvious abnormality, atraumatic Neck: no adenopathy, supple, symmetrical, trachea midline and thyroid normal to inspection and palpation Lungs: clear to auscultation bilaterally Cardiovascular: regular rate and rhythm Breasts: normal appearance, no masses or tenderness Abdomen: soft, non-tender; non distended,  no masses,  no organomegaly Extremities: extremities normal, atraumatic, no cyanosis or edema Skin: Skin color, texture, turgor normal. No rashes or lesions Lymph nodes: Cervical, supraclavicular, and axillary nodes normal. No abnormal inguinal nodes palpated Neurologic: Grossly normal   Pelvic: External genitalia:  no lesions              Urethra:  normal appearing urethra with no masses, tenderness or lesions              Bartholins and Skenes: normal                 Vagina: normal appearing atrophic vagina with normal color and discharge, no lesions              Cervix: no lesions               Bimanual Exam:  Uterus:  normal size, contour, position, consistency, mobility, non-tender              Adnexa: no mass, fullness, tenderness               Rectovaginal: Confirms               Anus:  normal sphincter tone, no lesions  Chaperone was present for exam.  A:  Well Woman with normal exam  Osteoporosis, followed by Endocrinology. On Reclast  P:   No pap this year  Mammogram and colonoscopy UTD  Continue calcium and vit  Labs with primary MD  Breast self awareness

## 2017-11-20 NOTE — Patient Instructions (Signed)
EXERCISE AND DIET:  We recommended that you start or continue a regular exercise program for good health. Regular exercise means any activity that makes your heart beat faster and makes you sweat.  We recommend exercising at least 30 minutes per day at least 3 days a week, preferably 4 or 5.  We also recommend a diet low in fat and sugar.  Inactivity, poor dietary choices and obesity can cause diabetes, heart attack, stroke, and kidney damage, among others.    ALCOHOL AND SMOKING:  Women should limit their alcohol intake to no more than 7 drinks/beers/glasses of wine (combined, not each!) per week. Moderation of alcohol intake to this level decreases your risk of breast cancer and liver damage. And of course, no recreational drugs are part of a healthy lifestyle.  And absolutely no smoking or even second hand smoke. Most people know smoking can cause heart and lung diseases, but did you know it also contributes to weakening of your bones? Aging of your skin?  Yellowing of your teeth and nails?  CALCIUM AND VITAMIN D:  Adequate intake of calcium and Vitamin D are recommended.  The recommendations for exact amounts of these supplements seem to change often, but generally speaking 600 mg of calcium (either carbonate or citrate) and 800 units of Vitamin D per day seems prudent. Certain women may benefit from higher intake of Vitamin D.  If you are among these women, your doctor will have told you during your visit.    PAP SMEARS:  Pap smears, to check for cervical cancer or precancers,  have traditionally been done yearly, although recent scientific advances have shown that most women can have pap smears less often.  However, every woman still should have a physical exam from her gynecologist every year. It will include a breast check, inspection of the vulva and vagina to check for abnormal growths or skin changes, a visual exam of the cervix, and then an exam to evaluate the size and shape of the uterus and  ovaries.  And after 67 years of age, a rectal exam is indicated to check for rectal cancers. We will also provide age appropriate advice regarding health maintenance, like when you should have certain vaccines, screening for sexually transmitted diseases, bone density testing, colonoscopy, mammograms, etc.   MAMMOGRAMS:  All women over 40 years old should have a yearly mammogram. Many facilities now offer a "3D" mammogram, which may cost around $50 extra out of pocket. If possible,  we recommend you accept the option to have the 3D mammogram performed.  It both reduces the number of women who will be called back for extra views which then turn out to be normal, and it is better than the routine mammogram at detecting truly abnormal areas.    COLONOSCOPY:  Colonoscopy to screen for colon cancer is recommended for all women at age 50.  We know, you hate the idea of the prep.  We agree, BUT, having colon cancer and not knowing it is worse!!  Colon cancer so often starts as a polyp that can be seen and removed at colonscopy, which can quite literally save your life!  And if your first colonoscopy is normal and you have no family history of colon cancer, most women don't have to have it again for 10 years.  Once every ten years, you can do something that may end up saving your life, right?  We will be happy to help you get it scheduled when you are ready.    Be sure to check your insurance coverage so you understand how much it will cost.  It may be covered as a preventative service at no cost, but you should check your particular policy.      Breast Self-Awareness Breast self-awareness means being familiar with how your breasts look and feel. It involves checking your breasts regularly and reporting any changes to your health care provider. Practicing breast self-awareness is important. A change in your breasts can be a sign of a serious medical problem. Being familiar with how your breasts look and feel allows  you to find any problems early, when treatment is more likely to be successful. All women should practice breast self-awareness, including women who have had breast implants. How to do a breast self-exam One way to learn what is normal for your breasts and whether your breasts are changing is to do a breast self-exam. To do a breast self-exam: Look for Changes  1. Remove all the clothing above your waist. 2. Stand in front of a mirror in a room with good lighting. 3. Put your hands on your hips. 4. Push your hands firmly downward. 5. Compare your breasts in the mirror. Look for differences between them (asymmetry), such as: ? Differences in shape. ? Differences in size. ? Puckers, dips, and bumps in one breast and not the other. 6. Look at each breast for changes in your skin, such as: ? Redness. ? Scaly areas. 7. Look for changes in your nipples, such as: ? Discharge. ? Bleeding. ? Dimpling. ? Redness. ? A change in position. Feel for Changes  Carefully feel your breasts for lumps and changes. It is best to do this while lying on your back on the floor and again while sitting or standing in the shower or tub with soapy water on your skin. Feel each breast in the following way:  Place the arm on the side of the breast you are examining above your head.  Feel your breast with the other hand.  Start in the nipple area and make  inch (2 cm) overlapping circles to feel your breast. Use the pads of your three middle fingers to do this. Apply light pressure, then medium pressure, then firm pressure. The light pressure will allow you to feel the tissue closest to the skin. The medium pressure will allow you to feel the tissue that is a little deeper. The firm pressure will allow you to feel the tissue close to the ribs.  Continue the overlapping circles, moving downward over the breast until you feel your ribs below your breast.  Move one finger-width toward the center of the body.  Continue to use the  inch (2 cm) overlapping circles to feel your breast as you move slowly up toward your collarbone.  Continue the up and down exam using all three pressures until you reach your armpit.  Write Down What You Find  Write down what is normal for each breast and any changes that you find. Keep a written record with breast changes or normal findings for each breast. By writing this information down, you do not need to depend only on memory for size, tenderness, or location. Write down where you are in your menstrual cycle, if you are still menstruating. If you are having trouble noticing differences in your breasts, do not get discouraged. With time you will become more familiar with the variations in your breasts and more comfortable with the exam. How often should I examine my breasts? Examine   your breasts every month. If you are breastfeeding, the best time to examine your breasts is after a feeding or after using a breast pump. If you menstruate, the best time to examine your breasts is 5-7 days after your period is over. During your period, your breasts are lumpier, and it may be more difficult to notice changes. When should I see my health care provider? See your health care provider if you notice:  A change in shape or size of your breasts or nipples.  A change in the skin of your breast or nipples, such as a reddened or scaly area.  Unusual discharge from your nipples.  A lump or thick area that was not there before.  Pain in your breasts.  Anything that concerns you.  This information is not intended to replace advice given to you by your health care provider. Make sure you discuss any questions you have with your health care provider. Document Released: 04/25/2005 Document Revised: 10/01/2015 Document Reviewed: 03/15/2015 Elsevier Interactive Patient Education  2018 [employer]vier Inc[employer]  

## 2017-11-30 ENCOUNTER — Other Ambulatory Visit (INDEPENDENT_AMBULATORY_CARE_PROVIDER_SITE_OTHER): Payer: Medicare Other

## 2017-11-30 DIAGNOSIS — I10 Essential (primary) hypertension: Secondary | ICD-10-CM | POA: Diagnosis not present

## 2017-11-30 DIAGNOSIS — E210 Primary hyperparathyroidism: Secondary | ICD-10-CM | POA: Diagnosis not present

## 2017-11-30 LAB — BASIC METABOLIC PANEL
BUN: 17 mg/dL (ref 6–23)
CO2: 29 meq/L (ref 19–32)
CREATININE: 0.82 mg/dL (ref 0.40–1.20)
Calcium: 9.8 mg/dL (ref 8.4–10.5)
Chloride: 102 mEq/L (ref 96–112)
GFR: 73.78 mL/min (ref >60.00)
GLUCOSE: 83 mg/dL (ref 70–99)
Potassium: 4.3 mEq/L (ref 3.5–5.1)
Sodium: 140 mEq/L (ref 135–145)

## 2017-11-30 LAB — CBC WITH DIFFERENTIAL/PLATELET
BASOS PCT: 0.4 % (ref 0.0–3.0)
Basophils Absolute: 0 10*3/uL (ref 0.0–0.1)
EOS ABS: 0.2 10*3/uL (ref 0.0–0.7)
EOS PCT: 3.1 % (ref 0.0–5.0)
HCT: 35.9 % — ABNORMAL LOW (ref 36.0–46.0)
Hemoglobin: 12.3 g/dL (ref 12.0–15.0)
LYMPHS ABS: 1 10*3/uL (ref 0.7–4.0)
Lymphocytes Relative: 18.8 % (ref 12.0–46.0)
MCHC: 34.3 g/dL (ref 30.0–36.0)
MCV: 92.4 fl (ref 78.0–100.0)
MONO ABS: 0.3 10*3/uL (ref 0.1–1.0)
Monocytes Relative: 5.9 % (ref 3.0–12.0)
NEUTROS ABS: 4 10*3/uL (ref 1.4–7.7)
NEUTROS PCT: 71.8 % (ref 43.0–77.0)
PLATELETS: 295 10*3/uL (ref 150.0–400.0)
RBC: 3.88 Mil/uL (ref 3.87–5.11)
RDW: 12.9 % (ref 11.5–15.5)
WBC: 5.6 10*3/uL (ref 4.0–10.5)

## 2017-11-30 LAB — HEPATIC FUNCTION PANEL
ALK PHOS: 71 U/L (ref 39–117)
ALT: 21 U/L (ref 0–35)
AST: 17 U/L (ref 0–37)
Albumin: 4.4 g/dL (ref 3.5–5.2)
BILIRUBIN TOTAL: 0.5 mg/dL (ref 0.2–1.2)
Bilirubin, Direct: 0.1 mg/dL (ref 0.0–0.3)
Total Protein: 7.4 g/dL (ref 6.0–8.3)

## 2017-11-30 LAB — LIPID PANEL
CHOL/HDL RATIO: 3
CHOLESTEROL: 182 mg/dL (ref 0–200)
HDL: 57.2 mg/dL (ref >39.00)
LDL CALC: 90 mg/dL (ref 0–99)
NONHDL: 124.58
Triglycerides: 172 mg/dL — ABNORMAL HIGH (ref 0.0–149.0)
VLDL: 34.4 mg/dL (ref 0.0–40.0)

## 2017-11-30 LAB — VITAMIN D 25 HYDROXY (VIT D DEFICIENCY, FRACTURES): VITD: 31.52 ng/mL (ref 30.00–100.00)

## 2017-11-30 LAB — TSH: TSH: 0.79 u[IU]/mL (ref 0.35–4.50)

## 2017-12-01 ENCOUNTER — Encounter: Payer: Self-pay | Admitting: Internal Medicine

## 2017-12-05 ENCOUNTER — Ambulatory Visit (INDEPENDENT_AMBULATORY_CARE_PROVIDER_SITE_OTHER): Payer: Medicare Other | Admitting: Internal Medicine

## 2017-12-05 ENCOUNTER — Encounter: Payer: Self-pay | Admitting: Internal Medicine

## 2017-12-05 DIAGNOSIS — M1990 Unspecified osteoarthritis, unspecified site: Secondary | ICD-10-CM | POA: Diagnosis not present

## 2017-12-05 DIAGNOSIS — K219 Gastro-esophageal reflux disease without esophagitis: Secondary | ICD-10-CM | POA: Diagnosis not present

## 2017-12-05 DIAGNOSIS — I773 Arterial fibromuscular dysplasia: Secondary | ICD-10-CM

## 2017-12-05 DIAGNOSIS — F411 Generalized anxiety disorder: Secondary | ICD-10-CM | POA: Diagnosis not present

## 2017-12-05 DIAGNOSIS — E210 Primary hyperparathyroidism: Secondary | ICD-10-CM

## 2017-12-05 DIAGNOSIS — I10 Essential (primary) hypertension: Secondary | ICD-10-CM

## 2017-12-05 DIAGNOSIS — I701 Atherosclerosis of renal artery: Secondary | ICD-10-CM | POA: Diagnosis not present

## 2017-12-05 DIAGNOSIS — M818 Other osteoporosis without current pathological fracture: Secondary | ICD-10-CM | POA: Diagnosis not present

## 2017-12-05 MED ORDER — CYCLOBENZAPRINE HCL 5 MG PO TABS: ORAL_TABLET | ORAL | 0 refills | Status: DC

## 2017-12-05 MED ORDER — LOSARTAN POTASSIUM 100 MG PO TABS: 100.0000 mg | ORAL_TABLET | Freq: Every day | ORAL | 3 refills | Status: DC

## 2017-12-05 MED ORDER — AMLODIPINE BESYLATE 5 MG PO TABS: 5.0000 mg | ORAL_TABLET | Freq: Every day | ORAL | 3 refills | Status: DC

## 2017-12-05 NOTE — Progress Notes (Signed)
Patient ID: Victoria Moss, female   DOB: 1950/07/21, 67 y.o.   MRN: [id number]   Subjective:    Patient ID: Victoria Moss, female    DOB: 09-12-1950, 67 y.o.   MRN: [id number]  HPI  Patient here for a follow up appt.  Was scheduled for a physical, but just had physical through gyn 11/20/17.  Some increased stress recently.  Mother recently passed away.  Has sitters for her dad.  Her father had MI recently.  Overall she feels she is handling things relatively well.  Does not feel needs anything more at this time.  No chest pain.  No sob.  No acid reflux.  No abdominal pain.  Bowels moving.  Off plaquenil.  Feels better.  Request f/u with local endocrinologist regarding her parathyroid.     Past Medical History:  Diagnosis Date  . Allergy   . Arthritis   . Basal cell cancer   . Diverticulosis    H/O  . GERD (gastroesophageal reflux disease)   . Heart murmur   . IBS (irritable bowel syndrome)   . Migraine    H/O, none since stopping chocolate  . Osteoporosis   . Rheumatic fever    H/O  . Status post dilation of esophageal narrowing    Past Surgical History:  Procedure Laterality Date  . APPENDECTOMY  1982  . BREAST BIOPSY Right 05/16   axillary node- neg  . CESAREAN SECTION    . COLONOSCOPY  2018  . DILATION AND CURETTAGE OF UTERUS     x2  . DILATION AND CURETTAGE, DIAGNOSTIC / THERAPEUTIC     x 2  . fatty tumor     on back x2  . HERNIA REPAIR    . parathyroid gland one removed    . PARATHYROIDECTOMY    . SKIN CANCER EXCISION     [facility]   . TOE SURGERY Left    joint replacement bit toe  . TUBAL LIGATION    . UMBILICAL HERNIA REPAIR    . UPPER GI ENDOSCOPY     Family History  Problem Relation Age of Onset  . Breast cancer Mother 24  . Hypertension Mother   . Stroke Mother        Late 23  . Arthritis Mother   . Colon polyps Mother   . Cancer Mother        Breast  . Hypertension Father   . Hyperlipidemia Father   . Dementia Father   . Diabetes  Neg Hx   . Colon cancer Neg Hx   . Esophageal cancer Neg Hx   . Kidney disease Neg Hx   . Gallbladder disease Neg Hx    Social History   Socioeconomic History  . Marital status: Widowed    Spouse name: Not on file  . Number of children: 2  . Years of education: Not on file  . Highest education level: Not on file  Occupational History  . Occupation: Retired    Fish farm manager: OTHER  Social Needs  . Financial resource strain: Not on file  . Food insecurity:    Worry: Not on file    Inability: Not on file  . Transportation needs:    Medical: Not on file    Non-medical: Not on file  Tobacco Use  . Smoking status: Former Smoker    Years: 7.00    Last attempt to quit: 05/09/1973    Years since quitting: 44.6  . Smokeless tobacco: Never Used  .  Tobacco comment: Quit at age 67  Substance and Sexual Activity  . Alcohol use: No    Alcohol/week: 0.0 oz  . Drug use: No  . Sexual activity: Never  Lifestyle  . Physical activity:    Days per week: Not on file    Minutes per session: Not on file  . Stress: Not on file  Relationships  . Social connections:    Talks on phone: Not on file    Gets together: Not on file    Attends religious service: Not on file    Active member of club or organization: Not on file    Attends meetings of clubs or organizations: Not on file    Relationship status: Not on file  Other Topics Concern  . Not on file  Social History Narrative  . Not on file    Outpatient Encounter Medications as of 12/05/2017  Medication Sig  . amLODipine (NORVASC) 5 MG tablet Take 1 tablet (5 mg total) by mouth daily.  . B Complex Vitamins (VITAMIN-B COMPLEX) TABS Take by mouth.  . Calcium Carb-Ergocalciferol 500-200 MG-UNIT TABS Take by mouth.  . Cholecalciferol (VITAMIN D3) 400 units CAPS Take by mouth.  . cyclobenzaprine (FLEXERIL) 5 MG tablet TAKE 1 TABLET BY MOUTH AT BEDTIME AS NEEDED FOR MUSCLE SPASMS  . dicyclomine (BENTYL) 10 MG capsule TAKE ONE CASPULE BY MOUTH  ONCE A DAY AS NEEDED AS DIRCTED BY PHYSICIAN.  Marland Kitchen losartan (COZAAR) 100 MG tablet Take 1 tablet (100 mg total) by mouth daily.  . Multiple Vitamin (MULTI-VITAMINS) TABS Take by mouth.  . Omega-3 Fatty Acids (FISH OIL PO) Take 1 tablet by mouth daily.   . pantoprazole (PROTONIX) 40 MG tablet Take 1 tablet (40 mg total) by mouth daily.  Marland Kitchen UNABLE TO FIND tumeric  . [DISCONTINUED] amLODipine (NORVASC) 5 MG tablet TAKE 1 TABLET BY MOUTH DAILY  . [DISCONTINUED] cyclobenzaprine (FLEXERIL) 5 MG tablet TAKE 1 TABLET BY MOUTH AT BEDTIME AS NEEDED FOR MUSCLE SPASMS  . [DISCONTINUED] losartan (COZAAR) 100 MG tablet TAKE ONE TABLET BY MOUTH EVERY DAY   No facility-administered encounter medications on file as of 12/05/2017.     Review of Systems  Constitutional: Negative for appetite change and unexpected weight change.  HENT: Negative for congestion and sinus pressure.   Respiratory: Negative for cough, chest tightness and shortness of breath.   Cardiovascular: Negative for chest pain, palpitations and leg swelling.  Gastrointestinal: Negative for abdominal pain, diarrhea, nausea and vomiting.  Genitourinary: Negative for difficulty urinating and dysuria.  Musculoskeletal: Negative for myalgias.       Thumb pain and swelling.    Skin: Negative for color change and rash.  Neurological: Negative for dizziness, light-headedness and headaches.  Psychiatric/Behavioral: Negative for agitation and dysphoric mood.       Objective:    Physical Exam  Constitutional: She appears well-developed and well-nourished. No distress.  HENT:  Nose: Nose normal.  Mouth/Throat: Oropharynx is clear and moist.  Neck: Neck supple. No thyromegaly present.  Cardiovascular: Normal rate and regular rhythm.  Pulmonary/Chest: Breath sounds normal. No respiratory distress. She has no wheezes.  Abdominal: Soft. Bowel sounds are normal. There is no tenderness.  Musculoskeletal: She exhibits no edema or tenderness.    Lymphadenopathy:    She has no cervical adenopathy.  Skin: No rash noted. No erythema.  Psychiatric: She has a normal mood and affect. Her behavior is normal.    BP 117/70 (BP Location: Left Arm, Patient Position: Sitting, Cuff Size: Normal)  Pulse 60   Temp 97.6 F (36.4 C) (Oral)   Resp 18   Wt 147 lb (66.7 kg)   LMP 05/09/2006 (Approximate)   SpO2 97%   BMI 24.46 kg/m  Wt Readings from Last 3 Encounters:  12/05/17 147 lb (66.7 kg)  11/20/17 144 lb (65.3 kg)  10/03/17 141 lb (64 kg)     Lab Results  Component Value Date   WBC 5.6 11/30/2017   HGB 12.3 11/30/2017   HCT 35.9 (L) 11/30/2017   PLT 295.0 11/30/2017   GLUCOSE 83 11/30/2017   CHOL 182 11/30/2017   TRIG 172.0 (H) 11/30/2017   HDL 57.20 11/30/2017   LDLCALC 90 11/30/2017   ALT 21 11/30/2017   AST 17 11/30/2017   NA 140 11/30/2017   K 4.3 11/30/2017   CL 102 11/30/2017   CREATININE 0.82 11/30/2017   BUN 17 11/30/2017   CO2 29 11/30/2017   TSH 0.79 11/30/2017   HGBA1C 5.6 12/31/2014    Mm Screening Breast Tomo Bilateral  Result Date: 09/06/2017 CLINICAL DATA:  Screening. EXAM: DIGITAL SCREENING BILATERAL MAMMOGRAM WITH TOMO AND CAD COMPARISON:  Previous exam(s). ACR Breast Density Category c: The breast tissue is heterogeneously dense, which may obscure small masses. FINDINGS: There are no findings suspicious for malignancy. Images were processed with CAD. IMPRESSION: No mammographic evidence of malignancy. A result letter of this screening mammogram will be mailed directly to the patient. RECOMMENDATION: Screening mammogram in one year. (Code:SM-B-01Y) BI-RADS CATEGORY  1: Negative. Electronically Signed   By: Nolon Nations M.D.   On: 09/06/2017 15:09       Assessment & Plan:   Problem List Items Addressed This Visit    Fibromuscular dysplasia ([place])    Evaluated by [facility].  Recommended f/u in two years.  Due f/u in 04/2019.        Relevant Medications   amLODipine (NORVASC) 5 MG tablet    losartan (COZAAR) 100 MG tablet   Generalized anxiety disorder    Off citalopram.  Follow.        GERD (gastroesophageal reflux disease)    Controlled on protonix.  Follow.        Hypertension    Blood pressure under good control.  Continue same medication regimen.  Follow pressures.  Follow metabolic panel.        Relevant Medications   amLODipine (NORVASC) 5 MG tablet   losartan (COZAAR) 100 MG tablet   Other Relevant Orders   Hepatic function panel   Lipid panel   Basic metabolic panel   Inflammatory arthritis    No longer on plaquenil.  Feels better.  Follow.        Relevant Medications   cyclobenzaprine (FLEXERIL) 5 MG tablet   Osteoporosis    Received reclast.  Due in 05/2017.  Follow.        Primary hyperparathyroidism ([place])    Previously followed by Dr Gale Journey.  Follow calcium.  Request f/u with local endocrinologist.        Relevant Orders   Ambulatory referral to Endocrinology       Einar Pheasant, MD

## 2017-12-09 ENCOUNTER — Encounter: Payer: Self-pay | Admitting: Internal Medicine

## 2017-12-09 NOTE — Assessment & Plan Note (Signed)
Controlled on protonix.  Follow.   

## 2017-12-09 NOTE — Assessment & Plan Note (Signed)
Off citalopram.  Follow.

## 2017-12-09 NOTE — Assessment & Plan Note (Signed)
Received reclast.  Due in 05/2017.  Follow.

## 2017-12-09 NOTE — Assessment & Plan Note (Signed)
Evaluated by [facility].  Recommended f/u in two years.  Due f/u in 04/2019.   

## 2017-12-09 NOTE — Assessment & Plan Note (Signed)
No longer on plaquenil.  Feels better.  Follow.

## 2017-12-09 NOTE — Assessment & Plan Note (Signed)
Previously followed by Dr Gale Journey.  Follow calcium.  Request f/u with local endocrinologist.

## 2017-12-09 NOTE — Assessment & Plan Note (Signed)
Blood pressure under good control.  Continue same medication regimen.  Follow pressures.  Follow metabolic panel.   

## 2018-01-22 ENCOUNTER — Ambulatory Visit: Payer: Medicare Other | Admitting: Podiatry

## 2018-02-06 ENCOUNTER — Ambulatory Visit (INDEPENDENT_AMBULATORY_CARE_PROVIDER_SITE_OTHER): Payer: Medicare Other | Admitting: Family Medicine

## 2018-02-06 ENCOUNTER — Telehealth: Payer: Self-pay | Admitting: Family Medicine

## 2018-02-06 ENCOUNTER — Encounter: Payer: Self-pay | Admitting: Family Medicine

## 2018-02-06 VITALS — BP 118/72 | HR 67 | Temp 98.0°F | Ht 65.0 in | Wt 142.0 lb

## 2018-02-06 DIAGNOSIS — E538 Deficiency of other specified B group vitamins: Secondary | ICD-10-CM | POA: Diagnosis not present

## 2018-02-06 DIAGNOSIS — E559 Vitamin D deficiency, unspecified: Secondary | ICD-10-CM

## 2018-02-06 DIAGNOSIS — M2550 Pain in unspecified joint: Secondary | ICD-10-CM | POA: Diagnosis not present

## 2018-02-06 DIAGNOSIS — M1990 Unspecified osteoarthritis, unspecified site: Secondary | ICD-10-CM

## 2018-02-06 DIAGNOSIS — Z23 Encounter for immunization: Secondary | ICD-10-CM | POA: Diagnosis not present

## 2018-02-06 DIAGNOSIS — R5382 Chronic fatigue, unspecified: Secondary | ICD-10-CM | POA: Diagnosis not present

## 2018-02-06 DIAGNOSIS — W57XXXA Bitten or stung by nonvenomous insect and other nonvenomous arthropods, initial encounter: Secondary | ICD-10-CM

## 2018-02-06 LAB — CBC
HEMATOCRIT: 37.4 % (ref 36.0–46.0)
HEMOGLOBIN: 12.8 g/dL (ref 12.0–15.0)
MCHC: 34.2 g/dL (ref 30.0–36.0)
MCV: 91.2 fl (ref 78.0–100.0)
PLATELETS: 282 10*3/uL (ref 150.0–400.0)
RBC: 4.1 Mil/uL (ref 3.87–5.11)
RDW: 12.9 % (ref 11.5–15.5)
WBC: 5.8 10*3/uL (ref 4.0–10.5)

## 2018-02-06 LAB — B12 AND FOLATE PANEL: Folate: >23.9 ng/mL (ref >5.9)

## 2018-02-06 LAB — VITAMIN D 25 HYDROXY (VIT D DEFICIENCY, FRACTURES): VITD: 32.65 ng/mL (ref 30.00–100.00)

## 2018-02-06 NOTE — Telephone Encounter (Signed)
Correct test has been ordered

## 2018-02-06 NOTE — Progress Notes (Signed)
Subjective:    Patient ID: Victoria Moss, female    DOB: 05/23/1950, 67 y.o.   MRN: [id number]  HPI   Presents to clinic c/o pain in hands, feet and inner thighs off and on for months, seems more noticeable past few weeks.  Patient has seen rheumatology for his chronic pains, but states she never felt as if she was given straightforward answer as to why she has this going on.  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 She remains hesitant to start methotrexate due to potential risks of use and feeling that there is a low probability of improving her symptoms. We reviewed the weak evidence base for effective therapies in both OA and CPPD, and that her options are limited both due to this and due to her GI issues which make both oral NSAIDs and oral MTX problematic. She is taking HCQ without dramatic improvement. Local steroid injections are certainly an option, and this was done today with instructions to monitor amount and duration of improvement so we can decide if this is worth repeating. If she notes a good response, would schedule in [place] clinic next time for targeted injection to the ulnar compartment. We reviewed her questions about MTX and she will continue to consider this as an option, she will let me know if she wants to start this before our next scheduled appointment. - Continue HCQ 200mg  BID - Right radiocarpal joint injected today - Consider future US-guided injections if good response - Continue to consider MTX for erosive/inflammatory arthritis  Patient also reports a chronic fatigue and also chronic abdominal issues.  Patient states she was bitten by tick many years ago, and has read about late onset Lyme disease so would like this tested for.  Patient  also has history of ulcers, she is treated with antiacid medicine and is taking Carafate, but often will have feelings of bloating and cramping and is unsure why she continues to have these abdominal symptoms.   Patient Active Problem List   Diagnosis Date Noted  . Lipoma of back 09/02/2017  . Current use of proton pump inhibitor 07/18/2017  . Hypertension 10/22/2016  . Closed nondisplaced fracture of styloid process of right ulna 08/11/2016  . Ganglion cyst of both wrists 06/24/2016  . Carotid stenosis 03/21/2016  . Renal artery stenosis ([place]) 03/21/2016  . Generalized anxiety disorder 12/17/2015  . Primary hyperparathyroidism ([place]) 02/06/2015  . Fibromuscular dysplasia ([place]) 12/03/2014  . Insomnia 10/27/2014  . Health care maintenance 08/10/2014  . Family history of colonic polyps 07/02/2013  . Inflammatory arthritis 12/23/2012  . IBS (irritable bowel syndrome) 12/23/2012  . Diverticulosis 12/23/2012  . GERD (gastroesophageal reflux disease) 12/23/2012  . Environmental allergies 12/23/2012  . Migraine 12/23/2012  . Osteoporosis    Social History   Tobacco Use  . Smoking status: Former Smoker    Years: 7.00    Last attempt to quit: 05/09/1973    Years since quitting: 44.7  . Smokeless tobacco: Never Used  . Tobacco comment: Quit at age 75  Substance Use Topics  . Alcohol use: No    Alcohol/week: 0.0 standard drinks    Review of Systems   Constitutional: +fatigue. Negative for chills, and fever.  HENT: Negative for congestion, ear pain, sinus pain and sore throat.   Eyes: Negative.  Respiratory: Negative for cough, shortness of breath and wheezing.   Cardiovascular: Negative for chest pain, palpitations and leg swelling.  Gastrointestinal: ABD cramping/bloating.  Genitourinary: Negative for dysuria, frequency and urgency.  Musculoskeletal: Bilat hand/foot pain, inner thigh pain  Skin: Negative for color change, pallor and rash.  Neurological: Negative for syncope,  light-headedness and headaches.  Psychiatric/Behavioral: The patient is not nervous/anxious.       Objective:   Physical Exam  Constitutional: She is oriented to person, place, and time. No distress.  HENT:  Head: Normocephalic and atraumatic.  Eyes: Conjunctivae and EOM are normal. No scleral icterus.  Neck: Normal range of motion. Neck supple. No tracheal deviation present.  Cardiovascular: Normal rate and regular rhythm.  Pulmonary/Chest: Effort normal. No respiratory distress. She has no wheezes. She has no rales.  Abdominal: Soft. Bowel sounds are normal. There is no tenderness.  Musculoskeletal: Normal range of motion.  Multiple joint tenderness. Knot on right wrist, been there for long time & evaluated by ortho/rheumatology Gait normal.   Neurological: She is alert and oriented to person, place, and time.  Skin: Skin is warm and dry. No rash noted. She is not diaphoretic. No pallor.  Psychiatric: She has a normal mood and affect. Her behavior is normal.  Nursing note and vitals reviewed.     Vitals:   02/06/18 1136  BP: 118/72  Pulse: 67  Temp: 98 F (36.7 C)  SpO2: 93%    Assessment & Plan:    A total of 25  minutes were spent face-to-face with the patient during this encounter and over half of that time was spent on counseling and coordination of care. The patient was counseled on how often autoimmune type of illnesses such as an inflammatory arthritis are hard to pinpoint exactly why they occur.  Also discussed often times many autoimmune type illnesses are diagnoses of exclusion rather than having a specific lab test or imaging study that confirms exactly what is going on.    Chronic fatigue/multiple joint pain (inflam arthritis) - we will get lab work including CBC, CMP, thyroid panel, vitamin D, V37, folic acid to evaluate other possible causes of fatigue.  Patient is interested in having new rheumatology referral for another opinion as to why she has office  chronic joint pain.  Rheumatology referral placed.  Tick bite --tick bite years ago, we will get Lyme disease and [facility] spotted fever lab work.  B12/vitamin D deficiency- it is possible patient has B12 and/or vitamin D deficiency, these could be contributing to her fatigue and multiple joint pain.  B12 and vitamin D lab levels ordered.  Abdominal pain- patient follows regularly with GI, plans to make another GI appointment.  Advised to continue taking antiacid medication as prescribed.  Also advised to keep note of any foods that seem to bother her stomach more than others.  Keep regularly scheduled follow-up as planned.  He will be contacted with information regarding rheumatology referral.

## 2018-02-06 NOTE — Telephone Encounter (Signed)
My lyme test was cancelled, looks like I clicked wrong order -- I put in different order, can you make sure that lyme is tested? Patient was concerned with this especially.  Thanks, LG

## 2018-02-06 NOTE — Addendum Note (Signed)
Addended by: Leeanne Rio on: 02/06/2018 02:30 PM   Modules accepted: Orders

## 2018-02-06 NOTE — Addendum Note (Signed)
Addended by: Neta Ehlers on: 02/06/2018 02:08 PM   Modules accepted: Orders

## 2018-02-07 LAB — THYROID PANEL WITH TSH
FREE THYROXINE INDEX: 2 (ref 1.4–3.8)
T3 UPTAKE: 29 % (ref 22–35)
T4 TOTAL: 7 ug/dL (ref 5.1–11.9)
TSH: 0.48 mIU/L (ref 0.40–4.50)

## 2018-02-07 LAB — ROCKY MTN SPOTTED FVR ABS PNL(IGG+IGM)
RMSF IGG: NOT DETECTED
RMSF IgM: NOT DETECTED

## 2018-02-08 LAB — B. BURGDORFI ANTIBODIES BY WB

## 2018-02-09 ENCOUNTER — Encounter: Payer: Self-pay | Admitting: Family Medicine

## 2018-02-13 ENCOUNTER — Encounter: Payer: Self-pay | Admitting: Internal Medicine

## 2018-02-14 DIAGNOSIS — M810 Age-related osteoporosis without current pathological fracture: Secondary | ICD-10-CM | POA: Diagnosis not present

## 2018-02-14 DIAGNOSIS — Z8639 Personal history of other endocrine, nutritional and metabolic disease: Secondary | ICD-10-CM | POA: Diagnosis not present

## 2018-02-14 DIAGNOSIS — E042 Nontoxic multinodular goiter: Secondary | ICD-10-CM | POA: Diagnosis not present

## 2018-02-19 ENCOUNTER — Ambulatory Visit: Payer: Medicare Other | Admitting: Podiatry

## 2018-02-22 DIAGNOSIS — M19031 Primary osteoarthritis, right wrist: Secondary | ICD-10-CM | POA: Diagnosis not present

## 2018-02-22 DIAGNOSIS — M25431 Effusion, right wrist: Secondary | ICD-10-CM | POA: Diagnosis not present

## 2018-02-22 DIAGNOSIS — M19032 Primary osteoarthritis, left wrist: Secondary | ICD-10-CM | POA: Diagnosis not present

## 2018-02-22 DIAGNOSIS — M1990 Unspecified osteoarthritis, unspecified site: Secondary | ICD-10-CM | POA: Diagnosis not present

## 2018-02-22 DIAGNOSIS — M8588 Other specified disorders of bone density and structure, other site: Secondary | ICD-10-CM | POA: Diagnosis not present

## 2018-02-26 ENCOUNTER — Other Ambulatory Visit: Payer: Self-pay | Admitting: Internal Medicine

## 2018-02-26 DIAGNOSIS — M25431 Effusion, right wrist: Secondary | ICD-10-CM

## 2018-02-26 DIAGNOSIS — E042 Nontoxic multinodular goiter: Secondary | ICD-10-CM | POA: Diagnosis not present

## 2018-03-05 ENCOUNTER — Ambulatory Visit (INDEPENDENT_AMBULATORY_CARE_PROVIDER_SITE_OTHER): Payer: Medicare Other | Admitting: Podiatry

## 2018-03-05 ENCOUNTER — Encounter: Payer: Self-pay | Admitting: Podiatry

## 2018-03-05 ENCOUNTER — Ambulatory Visit: Payer: Medicare Other | Admitting: Podiatry

## 2018-03-05 ENCOUNTER — Ambulatory Visit (INDEPENDENT_AMBULATORY_CARE_PROVIDER_SITE_OTHER): Payer: Medicare Other

## 2018-03-05 DIAGNOSIS — I701 Atherosclerosis of renal artery: Secondary | ICD-10-CM

## 2018-03-05 DIAGNOSIS — M722 Plantar fascial fibromatosis: Secondary | ICD-10-CM | POA: Diagnosis not present

## 2018-03-05 NOTE — Progress Notes (Signed)
She presents today chief complaint of painful left heel pain she is has been aching for the past 2 to 3 months mornings are particularly bad making the right foot issues that already have hurt much worse.  She can talk to not take NSAIDs due to GI upset and ulcers.  Objective: Vital signs are stable she is alert and oriented x3 pulses are palpable.  Light sensorium is intact degenerative flexors are intact muscle strength is normal symmetrical.  Orthopedic evaluation demonstrates all joints distal ankle full range of motion no crepitus she does have pain on palpation medial calcaneal tubercle of the left heel.  Radiographs taken today do demonstrate a plantar distally oriented calcaneal heel spur with soft tissue increase in density plantar fashion calcaneal insertion site and a hemiarthroplasty with implant first metatarsal phalangeal joint left.  Assessment: Plantar fasciitis left.  Plan: After sterile Betadine skin prep I injected left heel 20 mg Kenalog 5 mg Marcaine point maximal tenderness.  Tolerated procedure well without complications.  At this point she will continue on 20 mg of prednisone which she is currently taking daily I also placed her in a plantar fascial brace and a night splint.

## 2018-03-05 NOTE — Patient Instructions (Signed)

## 2018-03-15 ENCOUNTER — Ambulatory Visit
Admission: RE | Admit: 2018-03-15 | Discharge: 2018-03-15 | Disposition: A | Discharge status: Home / Self Care | Payer: Medicare Other | Source: Ambulatory Visit | Attending: Internal Medicine | Admitting: Internal Medicine

## 2018-03-15 DIAGNOSIS — M25431 Effusion, right wrist: Secondary | ICD-10-CM | POA: Diagnosis present

## 2018-03-15 DIAGNOSIS — X58XXXA Exposure to other specified factors, initial encounter: Secondary | ICD-10-CM | POA: Insufficient documentation

## 2018-03-15 DIAGNOSIS — M659 Synovitis and tenosynovitis, unspecified: Secondary | ICD-10-CM | POA: Diagnosis not present

## 2018-03-15 DIAGNOSIS — S63591A Other specified sprain of right wrist, initial encounter: Secondary | ICD-10-CM | POA: Diagnosis not present

## 2018-03-15 DIAGNOSIS — S63511A Sprain of carpal joint of right wrist, initial encounter: Secondary | ICD-10-CM | POA: Diagnosis not present

## 2018-03-15 MED ORDER — GADOBUTROL 1 MMOL/ML IV SOLN
6.5000 mL | Freq: Once | INTRAVENOUS | Status: AC | PRN
  Administered 2018-03-15: 6.5 mL via INTRAVENOUS

## 2018-03-21 DIAGNOSIS — M0600 Rheumatoid arthritis without rheumatoid factor, unspecified site: Secondary | ICD-10-CM | POA: Diagnosis not present

## 2018-03-21 DIAGNOSIS — M1990 Unspecified osteoarthritis, unspecified site: Secondary | ICD-10-CM | POA: Diagnosis not present

## 2018-03-21 DIAGNOSIS — Z79899 Other long term (current) drug therapy: Secondary | ICD-10-CM | POA: Diagnosis not present

## 2018-04-02 ENCOUNTER — Encounter: Payer: Self-pay | Admitting: Podiatry

## 2018-04-02 ENCOUNTER — Ambulatory Visit (INDEPENDENT_AMBULATORY_CARE_PROVIDER_SITE_OTHER): Payer: Medicare Other | Admitting: Podiatry

## 2018-04-02 DIAGNOSIS — M722 Plantar fascial fibromatosis: Secondary | ICD-10-CM

## 2018-04-03 NOTE — Progress Notes (Signed)
She presents today for follow-up of her left heel she states is doing much better but is not 100% yet.  Objective: Vital signs are stable alert and oriented x3.  There is no erythema to be slight cellulitis drainage or odor she has pain on palpation medial calcaneal tubercle of the left heel.  Assessment: Plantar fasciitis left.  Plan: Reinjected left heel today plantar fascial site with 20 mg Kenalog 5 mg Marcaine after sterile Betadine skin prep and I will follow-up with her in 1 month if necessary.

## 2018-05-05 ENCOUNTER — Other Ambulatory Visit: Payer: Self-pay | Admitting: Internal Medicine

## 2018-05-08 DIAGNOSIS — H43813 Vitreous degeneration, bilateral: Secondary | ICD-10-CM | POA: Diagnosis not present

## 2018-05-10 DIAGNOSIS — Z79899 Other long term (current) drug therapy: Secondary | ICD-10-CM | POA: Diagnosis not present

## 2018-05-10 DIAGNOSIS — M0600 Rheumatoid arthritis without rheumatoid factor, unspecified site: Secondary | ICD-10-CM | POA: Diagnosis not present

## 2018-05-10 DIAGNOSIS — M1990 Unspecified osteoarthritis, unspecified site: Secondary | ICD-10-CM | POA: Diagnosis not present

## 2018-05-14 ENCOUNTER — Encounter: Payer: Self-pay | Admitting: Podiatry

## 2018-05-14 ENCOUNTER — Ambulatory Visit (INDEPENDENT_AMBULATORY_CARE_PROVIDER_SITE_OTHER): Payer: Medicare Other | Admitting: Podiatry

## 2018-05-14 DIAGNOSIS — L600 Ingrowing nail: Secondary | ICD-10-CM | POA: Diagnosis not present

## 2018-05-14 MED ORDER — NEOMYCIN-POLYMYXIN-HC 1 % OT SOLN: OTIC | 1 refills | Status: DC

## 2018-05-14 NOTE — Patient Instructions (Signed)

## 2018-05-14 NOTE — Progress Notes (Signed)
She presents today for follow-up of plantar fasciitis left.  States that the injection really helped but the pain is somewhat plantar and posterior now and she is complaining of a painful ingrown toenail to the lateral border left hallux.  Objective: Vital signs are stable alert and oriented x3.  Pulses are palpable.  Still has some mild plantar fasciitis left heel.  Sharp incurvated nail margin on fibular border of the hallux left gross granulation tissue present no purulence no malodor.  Assessment: Ingrown nail fibular border hallux left.  Resolving plantar fasciitis left.  Plan: After sterile Betadine skin prep local anesthetic was administered to the hallux left.  She tolerated the medical matrixectomy very well without complications.  She provided with both oral and written restriction for the care and soaking of her left great toe.  She is also provided Cortisporin Otic prescription which she will apply twice daily after soaking.  Follow-up with her in about 2 weeks.  We will recheck plantar fasciitis at that time.

## 2018-05-18 ENCOUNTER — Other Ambulatory Visit: Payer: Self-pay | Admitting: Internal Medicine

## 2018-06-04 ENCOUNTER — Ambulatory Visit: Payer: Medicare Other | Admitting: Podiatry

## 2018-06-06 ENCOUNTER — Encounter: Payer: Self-pay | Admitting: Podiatry

## 2018-06-06 ENCOUNTER — Ambulatory Visit (INDEPENDENT_AMBULATORY_CARE_PROVIDER_SITE_OTHER): Payer: Medicare Other | Admitting: Podiatry

## 2018-06-06 DIAGNOSIS — L600 Ingrowing nail: Secondary | ICD-10-CM | POA: Diagnosis not present

## 2018-06-06 DIAGNOSIS — M722 Plantar fascial fibromatosis: Secondary | ICD-10-CM | POA: Diagnosis not present

## 2018-06-06 DIAGNOSIS — Z9889 Other specified postprocedural states: Secondary | ICD-10-CM

## 2018-06-06 NOTE — Progress Notes (Signed)
She presents today states that her toenail is doing great she is very happy with the outcome of that so far she states that the pain has moved from the medial aspect of the heel to the lateral heel at this point.  Objective: Vital signs are stable she is alert and oriented x3.  Pulses are palpable.  She has pain on palpation lateral aspect of the heel only she has absolutely no pain on palpation medial aspect of the calcaneus at the plantar fashion calcaneal insertion site.  Assessment: Lateral tubercle plantar fasciitis at this point.  Plan: Injected that area today with 10 mg Kenalog 5 mg Marcaine point maximal tenderness of the left heel.  Follow-up with me in 1 month if necessary.

## 2018-06-07 DIAGNOSIS — K2960 Other gastritis without bleeding: Secondary | ICD-10-CM | POA: Diagnosis not present

## 2018-06-07 DIAGNOSIS — K210 Gastro-esophageal reflux disease with esophagitis: Secondary | ICD-10-CM | POA: Diagnosis not present

## 2018-06-12 ENCOUNTER — Other Ambulatory Visit (INDEPENDENT_AMBULATORY_CARE_PROVIDER_SITE_OTHER): Payer: Medicare Other

## 2018-06-12 DIAGNOSIS — I10 Essential (primary) hypertension: Secondary | ICD-10-CM | POA: Diagnosis not present

## 2018-06-12 LAB — LIPID PANEL
CHOLESTEROL: 185 mg/dL (ref 0–200)
HDL: 58.7 mg/dL (ref >39.00)
LDL Cholesterol: 103 mg/dL — ABNORMAL HIGH (ref 0–99)
NonHDL: 126.3
TRIGLYCERIDES: 119 mg/dL (ref 0.0–149.0)
Total CHOL/HDL Ratio: 3
VLDL: 23.8 mg/dL (ref 0.0–40.0)

## 2018-06-12 LAB — BASIC METABOLIC PANEL
BUN: 13 mg/dL (ref 6–23)
CHLORIDE: 104 meq/L (ref 96–112)
CO2: 28 mEq/L (ref 19–32)
Calcium: 9.4 mg/dL (ref 8.4–10.5)
Creatinine, Ser: 0.72 mg/dL (ref 0.40–1.20)
GFR: 80.53 mL/min (ref >60.00)
Glucose, Bld: 82 mg/dL (ref 70–99)
Potassium: 4.2 mEq/L (ref 3.5–5.1)
Sodium: 139 mEq/L (ref 135–145)

## 2018-06-12 LAB — HEPATIC FUNCTION PANEL
ALT: 23 U/L (ref 0–35)
AST: 18 U/L (ref 0–37)
Albumin: 4.6 g/dL (ref 3.5–5.2)
Alkaline Phosphatase: 60 U/L (ref 39–117)
Bilirubin, Direct: 0.1 mg/dL (ref 0.0–0.3)
TOTAL PROTEIN: 7 g/dL (ref 6.0–8.3)
Total Bilirubin: 0.6 mg/dL (ref 0.2–1.2)

## 2018-06-14 ENCOUNTER — Ambulatory Visit (INDEPENDENT_AMBULATORY_CARE_PROVIDER_SITE_OTHER): Payer: Medicare Other | Admitting: Internal Medicine

## 2018-06-14 ENCOUNTER — Encounter: Payer: Self-pay | Admitting: Internal Medicine

## 2018-06-14 DIAGNOSIS — M8580 Other specified disorders of bone density and structure, unspecified site: Secondary | ICD-10-CM | POA: Diagnosis not present

## 2018-06-14 DIAGNOSIS — I701 Atherosclerosis of renal artery: Secondary | ICD-10-CM

## 2018-06-14 DIAGNOSIS — I773 Arterial fibromuscular dysplasia: Secondary | ICD-10-CM

## 2018-06-14 DIAGNOSIS — E210 Primary hyperparathyroidism: Secondary | ICD-10-CM | POA: Diagnosis not present

## 2018-06-14 DIAGNOSIS — I10 Essential (primary) hypertension: Secondary | ICD-10-CM | POA: Diagnosis not present

## 2018-06-14 DIAGNOSIS — K219 Gastro-esophageal reflux disease without esophagitis: Secondary | ICD-10-CM | POA: Diagnosis not present

## 2018-06-14 DIAGNOSIS — M069 Rheumatoid arthritis, unspecified: Secondary | ICD-10-CM | POA: Diagnosis not present

## 2018-06-14 MED ORDER — DICYCLOMINE HCL 10 MG PO CAPS: ORAL_CAPSULE | ORAL | 1 refills | Status: DC

## 2018-06-14 MED ORDER — AMLODIPINE BESYLATE 5 MG PO TABS: 5.0000 mg | ORAL_TABLET | Freq: Every day | ORAL | 5 refills | Status: DC

## 2018-06-14 MED ORDER — CYCLOBENZAPRINE HCL 5 MG PO TABS: ORAL_TABLET | ORAL | 0 refills | Status: DC

## 2018-06-14 NOTE — Progress Notes (Signed)
Patient ID: Victoria Moss, female   DOB: Nov 01, 1950, 68 y.o.   MRN: [id number]   Subjective:    Patient ID: Victoria Moss, female    DOB: 01-30-1951, 68 y.o.   MRN: [id number]  HPI  Patient here for a scheduled follow up.  She reports she is doing better.  Feels better.  Seeing rheumatology.  On oral MTX and doing well.  Taking 8/week.  Off prednisone now.  Also seeing Dr Gabriel Carina.  Stable.  Last evaluated 02/2018.  Recommended f/u thyroid ultrasound in one year.  Stays active. No chest pain.  No sob.  No acid reflux.  No abdominal pain.  Bowels moving.  No urine change.  Has been seeing Dr Milinda Pointer.  Foot stable.  Saw Dawson Bills 06/07/18.  Recommended continuing omeprazole.  Due f/u colonoscopy in 05/2021.  Discussed lab results.     Past Medical History:  Diagnosis Date  . Allergy   . Arthritis   . Basal cell cancer   . Diverticulosis    H/O  . GERD (gastroesophageal reflux disease)   . Heart murmur   . IBS (irritable bowel syndrome)   . Migraine    H/O, none since stopping chocolate  . Osteoporosis   . Rheumatic fever    H/O  . Status post dilation of esophageal narrowing    Past Surgical History:  Procedure Laterality Date  . APPENDECTOMY  1982  . BREAST BIOPSY Right 05/16   axillary node- neg  . CESAREAN SECTION    . COLONOSCOPY  2018  . DILATION AND CURETTAGE OF UTERUS     x2  . DILATION AND CURETTAGE, DIAGNOSTIC / THERAPEUTIC     x 2  . fatty tumor     on back x2  . HERNIA REPAIR    . parathyroid gland one removed    . PARATHYROIDECTOMY    . SKIN CANCER EXCISION     [facility]   . TOE SURGERY Left    joint replacement bit toe  . TUBAL LIGATION    . UMBILICAL HERNIA REPAIR    . UPPER GI ENDOSCOPY     Family History  Problem Relation Age of Onset  . Breast cancer Mother 69  . Hypertension Mother   . Stroke Mother        Late 67  . Arthritis Mother   . Colon polyps Mother   . Cancer Mother        Breast  . Hypertension Father   . Hyperlipidemia  Father   . Dementia Father   . Diabetes Neg Hx   . Colon cancer Neg Hx   . Esophageal cancer Neg Hx   . Kidney disease Neg Hx   . Gallbladder disease Neg Hx    Social History   Socioeconomic History  . Marital status: Widowed    Spouse name: Not on file  . Number of children: 2  . Years of education: Not on file  . Highest education level: Not on file  Occupational History  . Occupation: Retired    Fish farm manager: [employer]  Social Needs  . Financial resource strain: Not on file  . Food insecurity:    Worry: Not on file    Inability: Not on file  . Transportation needs:    Medical: Not on file    Non-medical: Not on file  Tobacco Use  . Smoking status: Former Smoker    Years: 7.00    Last attempt to quit: 05/09/1973    Years since  quitting: 45.1  . Smokeless tobacco: Never Used  . Tobacco comment: Quit at age 83  Substance and Sexual Activity  . Alcohol use: No    Alcohol/week: 0.0 standard drinks  . Drug use: No  . Sexual activity: Never  Lifestyle  . Physical activity:    Days per week: Not on file    Minutes per session: Not on file  . Stress: Not on file  Relationships  . Social connections:    Talks on phone: Not on file    Gets together: Not on file    Attends religious service: Not on file    Active member of club or organization: Not on file    Attends meetings of clubs or organizations: Not on file    Relationship status: Not on file  Other Topics Concern  . Not on file  Social History Narrative  . Not on file    Outpatient Encounter Medications as of 06/14/2018  Medication Sig  . amLODipine (NORVASC) 5 MG tablet Take 1 tablet (5 mg total) by mouth daily.  . B Complex Vitamins (VITAMIN-B COMPLEX) TABS Take by mouth.  . Calcium Carb-Ergocalciferol 500-200 MG-UNIT TABS Take by mouth.  . Cholecalciferol (VITAMIN D3) 400 units CAPS Take by mouth.  . cyclobenzaprine (FLEXERIL) 5 MG tablet TAKE 1 TABLET BY MOUTH AT BEDTIME AS NEEDED FOR MUSCLE SPASMS  .  dicyclomine (BENTYL) 10 MG capsule TAKE ONE CASPULE BY MOUTH ONCE A DAY AS NEEDED AS DIRCTED BY PHYSICIAN.  . folic acid (FOLVITE) 1 MG tablet Take by mouth.  . losartan (COZAAR) 100 MG tablet TAKE ONE TABLET BY MOUTH EVERY DAY  . methotrexate (RHEUMATREX) 2.5 MG tablet Take 6 tabs (15 mg ) once a week, 4 weeks, 1 refill  . Multiple Vitamin (MULTI-VITAMINS) TABS Take by mouth.  . Omega-3 Fatty Acids (FISH OIL PO) Take 1 tablet by mouth daily.   Marland Kitchen omeprazole (PRILOSEC) 20 MG capsule TAKE 1 CAPSULE EVERY DAY  . UNABLE TO FIND tumeric  . [DISCONTINUED] amLODipine (NORVASC) 5 MG tablet TAKE ONE TABLET BY MOUTH EVERY DAY  . [DISCONTINUED] cyclobenzaprine (FLEXERIL) 5 MG tablet TAKE 1 TABLET BY MOUTH AT BEDTIME AS NEEDED FOR MUSCLE SPASMS  . [DISCONTINUED] dicyclomine (BENTYL) 10 MG capsule TAKE ONE CASPULE BY MOUTH ONCE A DAY AS NEEDED AS DIRCTED BY PHYSICIAN.  . [DISCONTINUED] neomycin-polymyxin-hydrocortisone (CORTISPORIN) 3.5-10000-1 OTIC suspension   . [DISCONTINUED] pantoprazole (PROTONIX) 40 MG tablet Take 1 tablet (40 mg total) by mouth daily. (Patient not taking: Reported on 06/14/2018)  . [DISCONTINUED] predniSONE (DELTASONE) 5 MG tablet 1 tab daily   No facility-administered encounter medications on file as of 06/14/2018.     Review of Systems  Constitutional: Negative for appetite change and unexpected weight change.  HENT: Negative for congestion and sinus pressure.   Respiratory: Negative for cough, chest tightness and shortness of breath.   Cardiovascular: Negative for chest pain, palpitations and leg swelling.  Gastrointestinal: Negative for abdominal pain, diarrhea, nausea and vomiting.  Genitourinary: Negative for difficulty urinating and dysuria.  Musculoskeletal: Negative for joint swelling and myalgias.  Skin: Negative for color change and rash.  Neurological: Negative for dizziness, light-headedness and headaches.  Psychiatric/Behavioral: Negative for agitation and dysphoric  mood.       Objective:    Physical Exam Constitutional:      General: She is not in acute distress.    Appearance: Normal appearance.  HENT:     Nose: Nose normal. No congestion.     Mouth/Throat:  Pharynx: No oropharyngeal exudate or posterior oropharyngeal erythema.  Neck:     Musculoskeletal: Neck supple. No muscular tenderness.     Thyroid: No thyromegaly.  Cardiovascular:     Rate and Rhythm: Normal rate and regular rhythm.  Pulmonary:     Effort: No respiratory distress.     Breath sounds: Normal breath sounds. No wheezing.  Abdominal:     General: Bowel sounds are normal.     Palpations: Abdomen is soft.     Tenderness: There is no abdominal tenderness.  Musculoskeletal:        General: No swelling or tenderness.  Lymphadenopathy:     Cervical: No cervical adenopathy.  Skin:    Findings: No erythema or rash.  Neurological:     Mental Status: She is alert.  Psychiatric:        Mood and Affect: Mood normal.        Behavior: Behavior normal.     BP 120/74 (BP Location: Left Arm, Patient Position: Sitting, Cuff Size: Normal)   Pulse 75   Temp 98.3 F (36.8 C) (Oral)   Resp 15   Ht 5\' 5"  (1.651 m)   Wt 149 lb 9.6 oz (67.9 kg)   LMP 05/09/2006 (Approximate)   SpO2 98%   BMI 24.89 kg/m  Wt Readings from Last 3 Encounters:  06/14/18 149 lb 9.6 oz (67.9 kg)  02/06/18 142 lb (64.4 kg)  12/05/17 147 lb (66.7 kg)     Lab Results  Component Value Date   WBC 5.8 02/06/2018   HGB 12.8 02/06/2018   HCT 37.4 02/06/2018   PLT 282.0 02/06/2018   GLUCOSE 82 06/12/2018   CHOL 185 06/12/2018   TRIG 119.0 06/12/2018   HDL 58.70 06/12/2018   LDLCALC 103 (H) 06/12/2018   ALT 23 06/12/2018   AST 18 06/12/2018   NA 139 06/12/2018   K 4.2 06/12/2018   CL 104 06/12/2018   CREATININE 0.72 06/12/2018   BUN 13 06/12/2018   CO2 28 06/12/2018   TSH 0.48 02/06/2018   HGBA1C 5.6 12/31/2014    Mr Wrist Right W Wo Contrast  Result Date: 03/15/2018 CLINICAL DATA:   Mass on the right wrist for 5-6 years. EXAM: MR OF THE RIGHT WRIST WITHOUT AND WITH CONTRAST TECHNIQUE: Multiplanar multisequence MR imaging of the right wrist was performed both before and after the administration of intravenous contrast. CONTRAST:  6.5 cc Gadavist IV. COMPARISON:  Plain films right wrist 08/11/2017. FINDINGS: Ligaments: The scapholunate ligament is torn. No evidence of lunotriquetral ligament tear is identified. Triangular fibrocartilage: Severely degenerated with a tear of the disc. Tendons: Intact. There is mild thickening and intrasubstance increased T2 signal in the extensor carpi ulnaris consistent with tendinosis. Carpal tunnel/median nerve: Negative. Guyon's canal: Negative. Joint/cartilage: Extensive cartilage loss is present diffusely about the wrist including the radiocarpal joint. There is marked synovial thickening and enhancement diffusely about the carpus. The patient has a distal radioulnar joint effusion and intense synovitis about the joint. Bones/carpal alignment: Extensive erosive change is present throughout the carpus and the distal radioulnar joint. Other: No mass is identified. Hypertrophied synovium is seen subjacent to a marker placed over the region of concern. IMPRESSION: Negative for mass. Intense synovitis about the wrist with multifocal marrow edema and erosions likely due to inflammatory arthropathy such as rheumatoid. Gout is also possibility but thought somewhat less likely. Hypertrophied synovium accounts for the patient's palpated abnormality. Mild tendinosis of the extensor carpi ulnaris without tear. Complete tear of the  scapholunate ligament. Severely degenerated triangular fibrocartilage with extensive tearing throughout the disc. Electronically Signed   By: Inge Rise M.D.   On: 03/15/2018 13:36       Assessment & Plan:   Problem List Items Addressed This Visit    Fibromuscular dysplasia ([place])    Evaluated by [facility].  Recommended f/u in two  years.  Due f/u in 04/2019.        Relevant Medications   amLODipine (NORVASC) 5 MG tablet   GERD (gastroesophageal reflux disease)    Stable on current regimen.        Relevant Medications   dicyclomine (BENTYL) 10 MG capsule   Hypertension    Blood pressure under good control.  Continue same medication regimen.  Follow pressures.  Follow metabolic panel.        Relevant Medications   amLODipine (NORVASC) 5 MG tablet   Other Relevant Orders   Hepatic function panel   Lipid panel   TSH   Basic metabolic panel   Osteopenia    Recent bone density improved.  Received reclast.  Does not need dose now since improved - osteopenic range.  Follow.        Primary hyperparathyroidism ([facility])    Seeing Dr Gabriel Carina.  Follow calcium.        Renal artery stenosis ([facility])    Evaluated by [facility].  Follow renal function.        Relevant Medications   amLODipine (NORVASC) 5 MG tablet   Rheumatoid arthritis [facility])    Seeing rheumatology.  On MTX.  Doing well.  Follow.        Relevant Medications   cyclobenzaprine (FLEXERIL) 5 MG tablet       Einar Pheasant, MD

## 2018-06-17 ENCOUNTER — Encounter: Payer: Self-pay | Admitting: Internal Medicine

## 2018-06-17 NOTE — Assessment & Plan Note (Signed)
Evaluated by [facility].  Recommended f/u in two years.  Due f/u in 04/2019.

## 2018-06-17 NOTE — Assessment & Plan Note (Signed)
Evaluated by [facility].  Follow renal function.   

## 2018-06-17 NOTE — Assessment & Plan Note (Signed)
Blood pressure under good control.  Continue same medication regimen.  Follow pressures.  Follow metabolic panel.   

## 2018-06-17 NOTE — Assessment & Plan Note (Signed)
Recent bone density improved.  Received reclast.  Does not need dose now since improved - osteopenic range.  Follow.

## 2018-06-17 NOTE — Assessment & Plan Note (Signed)
Seeing rheumatology.  On MTX.  Doing well.  Follow.

## 2018-06-17 NOTE — Assessment & Plan Note (Signed)
Stable on current regimen   

## 2018-06-17 NOTE — Assessment & Plan Note (Signed)
Seeing Dr Gabriel Carina.  Follow calcium.

## 2018-06-20 ENCOUNTER — Other Ambulatory Visit: Payer: Self-pay | Admitting: Internal Medicine

## 2018-06-21 DIAGNOSIS — Z79899 Other long term (current) drug therapy: Secondary | ICD-10-CM | POA: Diagnosis not present

## 2018-06-21 DIAGNOSIS — M0600 Rheumatoid arthritis without rheumatoid factor, unspecified site: Secondary | ICD-10-CM | POA: Diagnosis not present

## 2018-06-21 DIAGNOSIS — M1990 Unspecified osteoarthritis, unspecified site: Secondary | ICD-10-CM | POA: Diagnosis not present

## 2018-07-03 ENCOUNTER — Encounter: Payer: Self-pay | Admitting: General Surgery

## 2018-07-03 ENCOUNTER — Ambulatory Visit (INDEPENDENT_AMBULATORY_CARE_PROVIDER_SITE_OTHER): Payer: Medicare Other | Admitting: General Surgery

## 2018-07-03 ENCOUNTER — Other Ambulatory Visit: Payer: Self-pay

## 2018-07-03 VITALS — BP 110/72 | HR 72 | Temp 97.9°F | Resp 16 | Ht 65.0 in | Wt 149.6 lb

## 2018-07-03 DIAGNOSIS — I701 Atherosclerosis of renal artery: Secondary | ICD-10-CM | POA: Diagnosis not present

## 2018-07-03 DIAGNOSIS — D171 Benign lipomatous neoplasm of skin and subcutaneous tissue of trunk: Secondary | ICD-10-CM | POA: Diagnosis not present

## 2018-07-03 NOTE — Patient Instructions (Addendum)
The patient is aware to call back for any questions or new concerns.  Schedule outpatient surgery  The patient has been given surgery information and will call when she is ready to schedule this.

## 2018-07-03 NOTE — Progress Notes (Signed)
Patient ID: Victoria Moss, female   DOB: [birth year], 68 y.o.   MRN: [id number]  Chief Complaint  Patient presents with  . Follow-up    lipoma on back, sore    HPI Victoria Moss is a 68 y.o. female.  Here for evaluation of a lipoma on her back, underneath her bra strap. It has been causing some discomfort for several years, worse with pressure. She has had this area removed twice in the past.  HPI  Past Medical History:  Diagnosis Date  . Allergy   . Arthritis   . Basal cell cancer   . Diverticulosis    H/O  . GERD (gastroesophageal reflux disease)   . Heart murmur   . IBS (irritable bowel syndrome)   . Migraine    H/O, none since stopping chocolate  . Osteoporosis   . Rheumatic fever    H/O  . Status post dilation of esophageal narrowing     Past Surgical History:  Procedure Laterality Date  . APPENDECTOMY  1982  . BREAST BIOPSY Right 05/16   axillary node- neg  . CESAREAN SECTION    . COLONOSCOPY  2018  . DILATION AND CURETTAGE OF UTERUS     x2  . DILATION AND CURETTAGE, DIAGNOSTIC / THERAPEUTIC     x 2  . fatty tumor     on back x2  . HERNIA REPAIR    . parathyroid gland one removed    . PARATHYROIDECTOMY    . SKIN CANCER EXCISION     [facility]   . TOE SURGERY Left    joint replacement bit toe  . TUBAL LIGATION    . UMBILICAL HERNIA REPAIR    . UPPER GI ENDOSCOPY      Family History  Problem Relation Age of Onset  . Breast cancer Mother 61  . Hypertension Mother   . Stroke Mother        Late 43  . Arthritis Mother   . Colon polyps Mother   . Cancer Mother        Breast  . Hypertension Father   . Hyperlipidemia Father   . Dementia Father   . Diabetes Neg Hx   . Colon cancer Neg Hx   . Esophageal cancer Neg Hx   . Kidney disease Neg Hx   . Gallbladder disease Neg Hx     Social History Social History   Tobacco Use  . Smoking status: Former Smoker    Years: 7.00    Last attempt to quit: 05/09/1973    Years since quitting: 45.1   . Smokeless tobacco: Never Used  . Tobacco comment: Quit at age 2  Substance Use Topics  . Alcohol use: No    Alcohol/week: 0.0 standard drinks  . Drug use: No    Allergies  Allergen Reactions  . Codeine Itching  . Lac Bovis Diarrhea    Other reaction(s): Abdominal Pain  . Penicillins Itching  . Propofol Nausea And Vomiting    Current Outpatient Medications  Medication Sig Dispense Refill  . amLODipine (NORVASC) 5 MG tablet Take 1 tablet (5 mg total) by mouth daily. 30 tablet 5  . B Complex Vitamins (VITAMIN-B COMPLEX) TABS Take by mouth.    . Calcium Carb-Ergocalciferol 500-200 MG-UNIT TABS Take by mouth.    . Cholecalciferol (VITAMIN D3) 400 units CAPS Take by mouth.    . cyclobenzaprine (FLEXERIL) 5 MG tablet TAKE 1 TABLET BY MOUTH AT BEDTIME AS NEEDED FOR MUSCLE SPASMS 30 tablet  0  . dicyclomine (BENTYL) 10 MG capsule TAKE ONE CASPULE BY MOUTH ONCE A DAY AS NEEDED AS DIRCTED BY PHYSICIAN. 90 capsule 1  . folic acid (FOLVITE) 1 MG tablet Take by mouth.    . losartan (COZAAR) 100 MG tablet TAKE ONE TABLET BY MOUTH EVERY DAY 30 tablet 3  . methotrexate (RHEUMATREX) 2.5 MG tablet Take 6 tabs (15 mg ) once a week, 4 weeks, 1 refill    . Multiple Vitamin (MULTI-VITAMINS) TABS Take by mouth.    . Omega-3 Fatty Acids (FISH OIL PO) Take 1 tablet by mouth daily.     Marland Kitchen omeprazole (PRILOSEC) 20 MG capsule TAKE 1 CAPSULE EVERY DAY    . UNABLE TO FIND tumeric     No current facility-administered medications for this visit.     Review of Systems Review of Systems  Constitutional: Negative.   Respiratory: Negative.   Cardiovascular: Negative.     Blood pressure 110/72, pulse 72, temperature 97.9 F (36.6 C), temperature source Skin, resp. rate 16, height 5\' 5"  (1.651 m), weight 149 lb 9.6 oz (67.9 kg), last menstrual period 05/09/2006, SpO2 98 %.  Physical Exam Physical Exam Constitutional:      Appearance: Normal appearance.  HENT:     Mouth/Throat:     Pharynx: No  oropharyngeal exudate.  Eyes:     Conjunctiva/sclera: Conjunctivae normal.  Neck:     Musculoskeletal: Neck supple.  Cardiovascular:     Rate and Rhythm: Normal rate and regular rhythm.     Pulses: Normal pulses.     Heart sounds: Normal heart sounds.  Pulmonary:     Effort: Pulmonary effort is normal.     Breath sounds: Normal breath sounds.    Skin:    General: Skin is warm and dry.     Comments: 7 x 10 cm left back lipoma  Neurological:     Mental Status: She is alert and oriented to person, place, and time.  Psychiatric:        Mood and Affect: Mood normal.        Behavior: Behavior normal.     Data Reviewed Basic metabolic panel June 12, 2018 was normal.  Assessment Recurrent left posterior flank lipoma.  Plan  Based on its size and previous attempted excision, the last under anesthesia, I think she be best served by having this completed at the hospital.   HPI, assessment, plan and physical exam has been scribed under the direction and in the presence of [name], MD. Karie Fetch, RN  I have completed the exam and reviewed the above documentation for accuracy and completeness.  I agree with the above.  Haematologist has been used and any errors in dictation or transcription are unintentional.  Hervey Ard, M.D., F.A.C.S.The patient has been given surgery information and will call when she is ready to schedule this.  Documented by Caryl-Lyn Otis Brace LPN  Forest Gleason Byrnett 07/04/2018, 10:31 AM

## 2018-07-06 DIAGNOSIS — L905 Scar conditions and fibrosis of skin: Secondary | ICD-10-CM | POA: Diagnosis not present

## 2018-07-11 ENCOUNTER — Telehealth: Payer: Self-pay | Admitting: *Deleted

## 2018-07-11 NOTE — Telephone Encounter (Signed)
The patient called the office wanting to schedule surgery with Dr. Bary Castilla. Patient's surgery to be scheduled for 08-06-18 at [facility] with Dr. Bary Castilla.  The patient is aware she will be contacted by the [facility] to complete a phone interview sometime in the near future.  The patient is aware to call the office should she have further questions.

## 2018-07-13 ENCOUNTER — Other Ambulatory Visit: Payer: Self-pay | Admitting: General Surgery

## 2018-07-13 DIAGNOSIS — D171 Benign lipomatous neoplasm of skin and subcutaneous tissue of trunk: Secondary | ICD-10-CM

## 2018-07-18 ENCOUNTER — Ambulatory Visit: Payer: Medicare Other | Admitting: Podiatry

## 2018-07-23 ENCOUNTER — Telehealth: Payer: Self-pay

## 2018-07-23 NOTE — Telephone Encounter (Signed)
Patient called and would like to reschedule her surgery due to the COVID virus.  Patient's surgery has been re scheduled for 08/27/18 at [facility] with Dr. Bary Castilla   The patient is aware she will be contacted by the [facility]-Admission Testing [facility] to complete a phone interview sometime in the near future.  The patient is aware to call the office should he/she have further questions.

## 2018-07-26 ENCOUNTER — Inpatient Hospital Stay: Admission: RE | Admit: 2018-07-26 | Payer: Self-pay | Source: Ambulatory Visit

## 2018-07-27 ENCOUNTER — Other Ambulatory Visit: Payer: Self-pay

## 2018-08-17 ENCOUNTER — Inpatient Hospital Stay: Admission: RE | Admit: 2018-08-17 | Payer: Self-pay | Source: Ambulatory Visit

## 2018-08-20 ENCOUNTER — Telehealth: Payer: Self-pay | Admitting: *Deleted

## 2018-08-20 NOTE — Telephone Encounter (Signed)
Message left for patient to call the office.   Surgery that is currently scheduled for 08-27-18 at [facility] with Dr. Bary Castilla will need to be cancelled at this time due to COVID-19 restrictions.   Patient will be contacted once restrictions are lifted in regards to rescheduling.

## 2018-08-20 NOTE — Telephone Encounter (Signed)
Patient called the office back and was notified per previous note.   The patient verbalizes understanding.  

## 2018-08-27 ENCOUNTER — Ambulatory Visit: Admission: RE | Admit: 2018-08-27 | Payer: Medicare Other | Source: Home / Self Care | Admitting: General Surgery

## 2018-08-27 ENCOUNTER — Encounter: Admission: RE | Payer: Self-pay | Source: Home / Self Care

## 2018-08-27 SURGERY — EXCISION LIPOMA
Anesthesia: General | Laterality: Left

## 2018-09-10 DIAGNOSIS — M0600 Rheumatoid arthritis without rheumatoid factor, unspecified site: Secondary | ICD-10-CM | POA: Diagnosis not present

## 2018-09-19 ENCOUNTER — Other Ambulatory Visit: Payer: Self-pay | Admitting: Internal Medicine

## 2018-09-19 DIAGNOSIS — Z79899 Other long term (current) drug therapy: Secondary | ICD-10-CM | POA: Diagnosis not present

## 2018-09-19 DIAGNOSIS — M0600 Rheumatoid arthritis without rheumatoid factor, unspecified site: Secondary | ICD-10-CM | POA: Diagnosis not present

## 2018-09-19 DIAGNOSIS — M1990 Unspecified osteoarthritis, unspecified site: Secondary | ICD-10-CM | POA: Diagnosis not present

## 2018-09-19 DIAGNOSIS — Z1231 Encounter for screening mammogram for malignant neoplasm of breast: Secondary | ICD-10-CM

## 2018-10-04 ENCOUNTER — Ambulatory Visit
Admission: RE | Admit: 2018-10-04 | Discharge: 2018-10-04 | Disposition: A | Discharge status: Home / Self Care | Payer: Medicare Other | Source: Ambulatory Visit | Attending: Internal Medicine | Admitting: Internal Medicine

## 2018-10-04 ENCOUNTER — Other Ambulatory Visit: Payer: Self-pay

## 2018-10-04 DIAGNOSIS — Z1231 Encounter for screening mammogram for malignant neoplasm of breast: Secondary | ICD-10-CM

## 2018-10-20 ENCOUNTER — Telehealth: Payer: Self-pay | Admitting: *Deleted

## 2018-10-20 NOTE — Telephone Encounter (Signed)
Patient contacted today about getting surgery rescheduled with Dr. Bary Castilla.   Surgery was previously cancelled due to COVID-19.   The patient states she is outdoors doing a lot of yard work at this time and she feels she would like to wait to have surgery in the fall due to this. She also states that she does not have a lot of people available to help her out after surgery which is another reason she wants to postpone.   Patient requests we call her in September about scheduling surgery for October 2020. The patient was placed in recalls.   The patient will require a pre-op visit with Dr. Bary Castilla.

## 2018-11-02 ENCOUNTER — Ambulatory Visit (INDEPENDENT_AMBULATORY_CARE_PROVIDER_SITE_OTHER): Payer: Medicare Other | Admitting: Family Medicine

## 2018-11-02 ENCOUNTER — Other Ambulatory Visit: Payer: Self-pay

## 2018-11-02 ENCOUNTER — Encounter: Payer: Self-pay | Admitting: Family Medicine

## 2018-11-02 ENCOUNTER — Telehealth: Payer: Self-pay | Admitting: *Deleted

## 2018-11-02 ENCOUNTER — Encounter: Payer: Self-pay | Admitting: Internal Medicine

## 2018-11-02 DIAGNOSIS — R21 Rash and other nonspecific skin eruption: Secondary | ICD-10-CM

## 2018-11-02 NOTE — Progress Notes (Signed)
Patient ID: Victoria Moss, female   DOB: November 19, 1950, 68 y.o.   MRN: [id number]    Virtual Visit via video Note  This visit type was conducted due to national recommendations for restrictions regarding the COVID-19 pandemic (e.g. social distancing).  This format is felt to be most appropriate for this patient at this time.  All issues noted in this document were discussed and addressed.  No physical exam was performed (except for noted visual exam findings with Video Visits).   I connected with Victoria Moss today at 10:20 AM EDT by a video enabled telemedicine application and verified that I am speaking with the correct person using two identifiers. Location patient: home Location provider: [facility] Persons participating in the virtual visit: patient, provider  I discussed the limitations, risks, security and privacy concerns of performing an evaluation and management service by video and the availability of in person appointments. I also discussed with the patient that there may be a patient responsible charge related to this service. The patient expressed understanding and agreed to proceed.  HPI:  Patient and I connected via video to discuss rash on left side.  First noticed rash couple of weeks ago, suspected it was from a bug bite.  Put antihistamine cream on the area and this seemed to calm the rash.  Today she noticed a small rash in the same area that is a little itchy.  Denies any drainage from the rash.  Denies seeing any small blisters.  Denies any exposure to new places, new soaps/lotions/detergents or new foods.  She states at times when sweating, she will have a rash breakout and this rash does seem like ones she has had before. Patient states she has been diligent throughout the pandemic to wear a mask when out of the home, do good handwashing and keep her distance from others. She lives alone. Does not go out other than to grocery store sparingly and walking alone in  park.   Patient also wondering if she could have the coronavirus test.  Main reason patient would like test is to be able to go see her father who is 55 years old.  States her father has not been able to see family members in about 4 months due to pandemic.  States she wanted the test to have peace of mind before going to see him.  Patient denies fever or chills, denies nausea or vomiting, no cough, chest pain, shortness of breath or wheezing.  No body aches.   ROS: See pertinent positives and negatives per HPI.  Past Medical History:  Diagnosis Date  . Allergy   . Arthritis   . Basal cell cancer   . Diverticulosis    H/O  . GERD (gastroesophageal reflux disease)   . Heart murmur   . IBS (irritable bowel syndrome)   . Migraine    H/O, none since stopping chocolate  . Osteoporosis   . Rheumatic fever    H/O  . Status post dilation of esophageal narrowing     Past Surgical History:  Procedure Laterality Date  . APPENDECTOMY  1982  . BREAST BIOPSY Right 05/16   axillary node- neg  . CESAREAN SECTION    . COLONOSCOPY  2018  . DILATION AND CURETTAGE OF UTERUS     x2  . DILATION AND CURETTAGE, DIAGNOSTIC / THERAPEUTIC     x 2  . fatty tumor     on back x2  . HERNIA REPAIR    . parathyroid  gland one removed    . PARATHYROIDECTOMY    . SKIN CANCER EXCISION     [facility]   . TOE SURGERY Left    joint replacement bit toe  . TUBAL LIGATION    . UMBILICAL HERNIA REPAIR    . UPPER GI ENDOSCOPY      Family History  Problem Relation Age of Onset  . Breast cancer Mother 13  . Hypertension Mother   . Stroke Mother        Late 39  . Arthritis Mother   . Colon polyps Mother   . Cancer Mother        Breast  . Hypertension Father   . Hyperlipidemia Father   . Dementia Father   . Diabetes Neg Hx   . Colon cancer Neg Hx   . Esophageal cancer Neg Hx   . Kidney disease Neg Hx   . Gallbladder disease Neg Hx    Social History   Tobacco Use  . Smoking status: Former Smoker     Years: 7.00    Quit date: 05/09/1973    Years since quitting: 45.5  . Smokeless tobacco: Never Used  . Tobacco comment: Quit at age 63  Substance Use Topics  . Alcohol use: No    Alcohol/week: 0.0 standard drinks    Current Outpatient Medications:  .  amLODipine (NORVASC) 5 MG tablet, Take 1 tablet (5 mg total) by mouth daily., Disp: 30 tablet, Rfl: 5 .  CALCIUM CARB-ERGOCALCIFEROL PO, Take 1 tablet by mouth daily. , Disp: , Rfl:  .  Cholecalciferol (VITAMIN D3) 400 units CAPS, Take 400 Units by mouth daily. , Disp: , Rfl:  .  cyclobenzaprine (FLEXERIL) 5 MG tablet, TAKE 1 TABLET BY MOUTH AT BEDTIME AS NEEDED FOR MUSCLE SPASMS (Patient taking differently: Take 5 mg by mouth at bedtime as needed for muscle spasms. TAKE 1 TABLET BY MOUTH AT BEDTIME AS NEEDED FOR MUSCLE SPASMS), Disp: 30 tablet, Rfl: 0 .  dicyclomine (BENTYL) 10 MG capsule, TAKE ONE CASPULE BY MOUTH ONCE A DAY AS NEEDED AS DIRCTED BY PHYSICIAN. (Patient taking differently: Take 10 mg by mouth daily as needed for spasms. TAKE ONE CASPULE BY MOUTH ONCE A DAY AS NEEDED AS DIRCTED BY PHYSICIAN.), Disp: 90 capsule, Rfl: 1 .  fluticasone (FLONASE) 50 MCG/ACT nasal spray, Place 1 spray into both nostrils daily as needed for allergies or rhinitis., Disp: , Rfl:  .  folic acid (FOLVITE) 1 MG tablet, Take 1 mg by mouth daily. , Disp: , Rfl:  .  losartan (COZAAR) 100 MG tablet, TAKE ONE TABLET BY MOUTH EVERY DAY (Patient taking differently: Take 100 mg by mouth daily. ), Disp: 30 tablet, Rfl: 3 .  methotrexate (RHEUMATREX) 2.5 MG tablet, Take 20 mg by mouth every Friday. , Disp: , Rfl:  .  Multiple Vitamin (MULTI-VITAMINS) TABS, Take 1 tablet by mouth daily. , Disp: , Rfl:  .  Omega-3 Fatty Acids (FISH OIL) 1000 MG CAPS, Take 1,000 mg by mouth daily. , Disp: , Rfl:  .  omeprazole (PRILOSEC) 20 MG capsule, Take 20 mg by mouth daily. , Disp: , Rfl:  .  Turmeric Curcumin 500 MG CAPS, Take 500 mg by mouth daily., Disp: , Rfl:   EXAM:   GENERAL: alert, oriented, appears well and in no acute distress  HEENT: atraumatic, conjunttiva clear, no obvious abnormalities on inspection of external nose and ears  NECK: normal movements of the head and neck  LUNGS: on inspection no signs of  respiratory distress, breathing rate appears normal, no obvious gross SOB, gasping or wheezing  CV: no obvious cyanosis  MS: moves all visible extremities without noticeable abnormality  SKIN: Maculopapular rash seen on left side of skin.  Patient was able to attach a photograph in her my chart so I was able to see the rash.  Rash does not look severe.  No obvious vesicles or pustules seen in the photograph.  PSYCH/NEURO: pleasant and cooperative, no obvious depression or anxiety, speech and thought processing grossly intact  ASSESSMENT AND PLAN:  Discussed the following assessment and plan:  Rash-patient advised that the rash does appear mild.  Believe rash would respond to a mild steroid cream.  Patient does have topical hydrocortisone cream 1% at home and will rub thin layer of this on rash twice per day.  Advised that if this is not helpful to let me know and I can send in a more potent prescription version of a steroid cream.  Also advised to take a daily antihistamine orally such as a Claritin or Zyrtec to help calm down bodies antihistamine response.  In regards to COVID-19 testing; patient advised we could get her tested due to her having a rash however even if her results are negative it does not guarantee she is safe to go see her dad who is 54 years old.  I would recommend to err on side of extreme caution due to father's age and not to go and see him at this time.  Patient would prefer not to be tested as she has no other symptoms than a small rash which we suspect is related to a dermatitis caused from sweating.  Discussed diligent handwashing, patient does wear a mask whenever leaving home and always keeps her distance of more than 6  feet from others.   I discussed the assessment and treatment plan with the patient. The patient was provided an opportunity to ask questions and all were answered. The patient agreed with the plan and demonstrated an understanding of the instructions.   The patient was advised to call back or seek an in-person evaluation if the symptoms worsen or if the condition fails to improve as anticipated.  15 minutes spent over video call with patient discussing rash treatment and pros and cons of COVID-19 testing.  Jodelle Green, FNP

## 2018-11-02 NOTE — Telephone Encounter (Signed)
Pt scheduled for virtual visit with Philis Nettle, FNP per patient request.

## 2018-11-02 NOTE — Telephone Encounter (Signed)
Copied from [place] [id number]. Topic: Appointment Scheduling - Scheduling Inquiry for Clinic >> Nov 02, 2018  8:46 AM Rainey Pines A wrote: Patient would like to schedule appointment for insect bite and rash on stomach  she has had for almost 2 weeks and would like to inquire about getting COVID test.

## 2018-11-02 NOTE — Telephone Encounter (Signed)
Pt saw lauren

## 2018-11-13 ENCOUNTER — Encounter: Payer: Self-pay | Admitting: Internal Medicine

## 2018-11-14 NOTE — Telephone Encounter (Signed)
Scheduled for doxy with Lauren tomorrow morning

## 2018-11-15 ENCOUNTER — Ambulatory Visit: Payer: Medicare Other | Admitting: Family Medicine

## 2018-11-21 DIAGNOSIS — D225 Melanocytic nevi of trunk: Secondary | ICD-10-CM | POA: Diagnosis not present

## 2018-11-21 DIAGNOSIS — D2271 Melanocytic nevi of right lower limb, including hip: Secondary | ICD-10-CM | POA: Diagnosis not present

## 2018-11-21 DIAGNOSIS — D2272 Melanocytic nevi of left lower limb, including hip: Secondary | ICD-10-CM | POA: Diagnosis not present

## 2018-11-21 DIAGNOSIS — Z08 Encounter for follow-up examination after completed treatment for malignant neoplasm: Secondary | ICD-10-CM | POA: Diagnosis not present

## 2018-11-21 DIAGNOSIS — D2262 Melanocytic nevi of left upper limb, including shoulder: Secondary | ICD-10-CM | POA: Diagnosis not present

## 2018-11-21 DIAGNOSIS — Z85828 Personal history of other malignant neoplasm of skin: Secondary | ICD-10-CM | POA: Diagnosis not present

## 2018-11-21 DIAGNOSIS — D2261 Melanocytic nevi of right upper limb, including shoulder: Secondary | ICD-10-CM | POA: Diagnosis not present

## 2018-11-22 ENCOUNTER — Other Ambulatory Visit: Payer: Self-pay

## 2018-11-22 ENCOUNTER — Ambulatory Visit (INDEPENDENT_AMBULATORY_CARE_PROVIDER_SITE_OTHER): Payer: Medicare Other | Admitting: Obstetrics and Gynecology

## 2018-11-22 ENCOUNTER — Other Ambulatory Visit (HOSPITAL_COMMUNITY)
Admission: RE | Admit: 2018-11-22 | Discharge: 2018-11-22 | Disposition: A | Discharge status: Home / Self Care | Payer: Medicare Other | Source: Ambulatory Visit | Attending: Obstetrics and Gynecology | Admitting: Obstetrics and Gynecology

## 2018-11-22 ENCOUNTER — Encounter: Payer: Self-pay | Admitting: Obstetrics and Gynecology

## 2018-11-22 VITALS — BP 118/76 | HR 64 | Temp 98.2°F | Ht 65.55 in | Wt 146.0 lb

## 2018-11-22 DIAGNOSIS — Z124 Encounter for screening for malignant neoplasm of cervix: Secondary | ICD-10-CM | POA: Insufficient documentation

## 2018-11-22 DIAGNOSIS — Z01419 Encounter for gynecological examination (general) (routine) without abnormal findings: Secondary | ICD-10-CM | POA: Diagnosis not present

## 2018-11-22 NOTE — Progress Notes (Signed)
68 y.o. G72P2012 Widowed White or Caucasian Not Hispanic or Latino female here for annual exam.  No vaginal bleeding. No bowel or bladder issues. Not sexually active.   She has a new Rheumatologist who has really made a difference in her arthritis pain. She has GI issues from her medications. Feeling okay.     Patient's last menstrual period was 05/09/2006 (approximate).          Sexually active: No.  The current method of family planning is post menopausal status.    Exercising: Yes.    walking, outdoor work Smoker:  no  Health Maintenance: Pap:11/17/2016 neg.         08/07/14 neg. HR HPV:neg  History of abnormal Pap:no MMG:10/04/2018 Birads 1 negative Colonoscopy:1/16/18normal.f/u 5 years BMD:02/22/2018 Osteopenia, was taken off Reclast, in Care Everywhere. Followed by Endocrinology. TDaP:08/2014    reports that she quit smoking about 45 years ago. She quit after 7.00 years of use. She has never used smokeless tobacco. She reports that she does not drink alcohol or use drugs. Dad is in a retirement community in [place], [place].  Daughter in [place] is pregnant with a girl and has a 68 year old boy. Other daughter lives in [place], married, doesn't want kids.   Past Medical History:  Diagnosis Date  . Allergy   . Arthritis   . Basal cell cancer   . Diverticulosis    H/O  . GERD (gastroesophageal reflux disease)   . Heart murmur   . IBS (irritable bowel syndrome)   . Migraine    H/O, none since stopping chocolate  . Osteoporosis   . Rheumatic fever    H/O  . Status post dilation of esophageal narrowing     Past Surgical History:  Procedure Laterality Date  . APPENDECTOMY  1982  . BREAST BIOPSY Right 05/16   axillary node- neg  . CESAREAN SECTION    . COLONOSCOPY  2018  . DILATION AND CURETTAGE OF UTERUS     x2  . DILATION AND CURETTAGE, DIAGNOSTIC / THERAPEUTIC     x 2  . fatty tumor     on back x2  . HERNIA REPAIR    . parathyroid gland one removed    .  PARATHYROIDECTOMY    . SKIN CANCER EXCISION     [facility]   . TOE SURGERY Left    joint replacement bit toe  . TUBAL LIGATION    . UMBILICAL HERNIA REPAIR    . UPPER GI ENDOSCOPY      Current Outpatient Medications  Medication Sig Dispense Refill  . amLODipine (NORVASC) 5 MG tablet Take 1 tablet (5 mg total) by mouth daily. 30 tablet 5  . CALCIUM CARB-ERGOCALCIFEROL PO Take 1 tablet by mouth daily.     . cyclobenzaprine (FLEXERIL) 5 MG tablet TAKE 1 TABLET BY MOUTH AT BEDTIME AS NEEDED FOR MUSCLE SPASMS (Patient taking differently: Take 5 mg by mouth at bedtime as needed for muscle spasms. TAKE 1 TABLET BY MOUTH AT BEDTIME AS NEEDED FOR MUSCLE SPASMS) 30 tablet 0  . dicyclomine (BENTYL) 10 MG capsule TAKE ONE CASPULE BY MOUTH ONCE A DAY AS NEEDED AS DIRCTED BY PHYSICIAN. (Patient taking differently: Take 10 mg by mouth daily as needed for spasms. TAKE ONE CASPULE BY MOUTH ONCE A DAY AS NEEDED AS DIRCTED BY PHYSICIAN.) 90 capsule 1  . fluticasone (FLONASE) 50 MCG/ACT nasal spray Place 1 spray into both nostrils daily as needed for allergies or rhinitis.    Marland Kitchen  folic acid (FOLVITE) 1 MG tablet Take 1 mg by mouth daily.     Marland Kitchen losartan (COZAAR) 100 MG tablet TAKE ONE TABLET BY MOUTH EVERY DAY (Patient taking differently: Take 100 mg by mouth daily. ) 30 tablet 3  . Multiple Vitamin (MULTI-VITAMINS) TABS Take 1 tablet by mouth daily.     . Omega-3 Fatty Acids (FISH OIL) 1000 MG CAPS Take 1,000 mg by mouth daily.     Marland Kitchen omeprazole (PRILOSEC) 20 MG capsule Take 20 mg by mouth daily.     . Turmeric Curcumin 500 MG CAPS Take 500 mg by mouth daily.    . methotrexate (RHEUMATREX) 2.5 MG tablet Take 20 mg by mouth every Friday.      No current facility-administered medications for this visit.     Family History  Problem Relation Age of Onset  . Breast cancer Mother 68  . Hypertension Mother   . Stroke Mother        Late 12  . Arthritis Mother   . Colon polyps Mother   . Cancer Mother        Breast   . Hypertension Father   . Hyperlipidemia Father   . Dementia Father   . Diabetes Neg Hx   . Colon cancer Neg Hx   . Esophageal cancer Neg Hx   . Kidney disease Neg Hx   . Gallbladder disease Neg Hx     Review of Systems  Constitutional: Negative.   HENT: Negative.   Eyes: Negative.   Respiratory: Negative.   Cardiovascular: Negative.   Gastrointestinal: Negative.   Endocrine: Negative.   Genitourinary: Negative.   Musculoskeletal: Negative.   Skin: Negative.   Allergic/Immunologic: Negative.   Neurological: Negative.   Hematological: Negative.   Psychiatric/Behavioral: Negative.     Exam:   BP 118/76 (BP Location: Right Arm, Patient Position: Sitting, Cuff Size: Normal)   Pulse 64   Temp 98.2 F (36.8 C) (Skin)   Ht 5' 5.55" (1.665 m)   Wt 146 lb (66.2 kg)   LMP 05/09/2006 (Approximate)   BMI 23.89 kg/m   Weight change: @WEIGHTCHANGE @ Height:   Height: 5' 5.55" (166.5 cm)  Ht Readings from Last 3 Encounters:  11/22/18 5' 5.55" (1.665 m)  07/03/18 5\' 5"  (1.651 m)  06/14/18 5\' 5"  (1.651 m)    General appearance: alert, cooperative and appears stated age Head: Normocephalic, without obvious abnormality, atraumatic Neck: no adenopathy, supple, symmetrical, trachea midline and thyroid normal to inspection and palpation Lungs: clear to auscultation bilaterally Cardiovascular: regular rate and rhythm Breasts: normal appearance, no masses or tenderness Abdomen: soft, non-tender; non distended,  no masses,  no organomegaly Extremities: extremities normal, atraumatic, no cyanosis or edema Skin: Skin color, texture, turgor normal. No rashes or lesions Lymph nodes: Cervical, supraclavicular, and axillary nodes normal. No abnormal inguinal nodes palpated Neurologic: Grossly normal   Pelvic: External genitalia:  no lesions              Urethra:  normal appearing urethra with no masses, tenderness or lesions              Bartholins and Skenes: normal                  Vagina: atrophic appearing vagina with normal color and discharge, no lesions              Cervix: no lesions               Bimanual Exam:  Uterus:  normal size, contour, position, consistency, mobility, non-tender              Adnexa: no mass, fullness, tenderness               Rectovaginal: Confirms               Anus:  normal sphincter tone, no lesions  Chaperone was present for exam.  A:  Well Woman with normal exam  P:   Pap with reflex hpv  Discussed breast self exam  Discussed calcium and vit D intake  Mammogram UTD  Colonoscopy UTD  DEXA with Endocrinology.   Labs are done with her Rheumatologist and Primary MD

## 2018-11-22 NOTE — Patient Instructions (Signed)
EXERCISE AND DIET:  We recommended that you start or continue a regular exercise program for good health. Regular exercise means any activity that makes your heart beat faster and makes you sweat.  We recommend exercising at least 30 minutes per day at least 3 days a week, preferably 4 or 5.  We also recommend a diet low in fat and sugar.  Inactivity, poor dietary choices and obesity can cause diabetes, heart attack, stroke, and kidney damage, among others.    ALCOHOL AND SMOKING:  Women should limit their alcohol intake to no more than 7 drinks/beers/glasses of wine (combined, not each!) per week. Moderation of alcohol intake to this level decreases your risk of breast cancer and liver damage. And of course, no recreational drugs are part of a healthy lifestyle.  And absolutely no smoking or even second hand smoke. Most people know smoking can cause heart and lung diseases, but did you know it also contributes to weakening of your bones? Aging of your skin?  Yellowing of your teeth and nails?  CALCIUM AND VITAMIN D:  Adequate intake of calcium and Vitamin D are recommended.  The recommendations for exact amounts of these supplements seem to change often, but generally speaking 1,200 mg of calcium (between diet and supplement) and 800 units of Vitamin D per day seems prudent. Certain women may benefit from higher intake of Vitamin D.  If you are among these women, your doctor will have told you during your visit.    PAP SMEARS:  Pap smears, to check for cervical cancer or precancers,  have traditionally been done yearly, although recent scientific advances have shown that most women can have pap smears less often.  However, every woman still should have a physical exam from her gynecologist every year. It will include a breast check, inspection of the vulva and vagina to check for abnormal growths or skin changes, a visual exam of the cervix, and then an exam to evaluate the size and shape of the uterus and  ovaries.  And after 68 years of age, a rectal exam is indicated to check for rectal cancers. We will also provide age appropriate advice regarding health maintenance, like when you should have certain vaccines, screening for sexually transmitted diseases, bone density testing, colonoscopy, mammograms, etc.   MAMMOGRAMS:  All women over 40 years old should have a yearly mammogram. Many facilities now offer a "3D" mammogram, which may cost around $50 extra out of pocket. If possible,  we recommend you accept the option to have the 3D mammogram performed.  It both reduces the number of women who will be called back for extra views which then turn out to be normal, and it is better than the routine mammogram at detecting truly abnormal areas.    COLON CANCER SCREENING: Now recommend starting at age 45. At this time colonoscopy is not covered for routine screening until 50. There are take home tests that can be done between 45-49.   COLONOSCOPY:  Colonoscopy to screen for colon cancer is recommended for all women at age 50.  We know, you hate the idea of the prep.  We agree, BUT, having colon cancer and not knowing it is worse!!  Colon cancer so often starts as a polyp that can be seen and removed at colonscopy, which can quite literally save your life!  And if your first colonoscopy is normal and you have no family history of colon cancer, most women don't have to have it again for   10 years.  Once every ten years, you can do something that may end up saving your life, right?  We will be happy to help you get it scheduled when you are ready.  Be sure to check your insurance coverage so you understand how much it will cost.  It may be covered as a preventative service at no cost, but you should check your particular policy.      Breast Self-Awareness Breast self-awareness means being familiar with how your breasts look and feel. It involves checking your breasts regularly and reporting any changes to your  health care provider. Practicing breast self-awareness is important. A change in your breasts can be a sign of a serious medical problem. Being familiar with how your breasts look and feel allows you to find any problems early, when treatment is more likely to be successful. All women should practice breast self-awareness, including women who have had breast implants. How to do a breast self-exam One way to learn what is normal for your breasts and whether your breasts are changing is to do a breast self-exam. To do a breast self-exam: Look for Changes  1. Remove all the clothing above your waist. 2. Stand in front of a mirror in a room with good lighting. 3. Put your hands on your hips. 4. Push your hands firmly downward. 5. Compare your breasts in the mirror. Look for differences between them (asymmetry), such as: ? Differences in shape. ? Differences in size. ? Puckers, dips, and bumps in one breast and not the other. 6. Look at each breast for changes in your skin, such as: ? Redness. ? Scaly areas. 7. Look for changes in your nipples, such as: ? Discharge. ? Bleeding. ? Dimpling. ? Redness. ? A change in position. Feel for Changes Carefully feel your breasts for lumps and changes. It is best to do this while lying on your back on the floor and again while sitting or standing in the shower or tub with soapy water on your skin. Feel each breast in the following way:  Place the arm on the side of the breast you are examining above your head.  Feel your breast with the other hand.  Start in the nipple area and make  inch (2 cm) overlapping circles to feel your breast. Use the pads of your three middle fingers to do this. Apply light pressure, then medium pressure, then firm pressure. The light pressure will allow you to feel the tissue closest to the skin. The medium pressure will allow you to feel the tissue that is a little deeper. The firm pressure will allow you to feel the tissue  close to the ribs.  Continue the overlapping circles, moving downward over the breast until you feel your ribs below your breast.  Move one finger-width toward the center of the body. Continue to use the  inch (2 cm) overlapping circles to feel your breast as you move slowly up toward your collarbone.  Continue the up and down exam using all three pressures until you reach your armpit.  Write Down What You Find  Write down what is normal for each breast and any changes that you find. Keep a written record with breast changes or normal findings for each breast. By writing this information down, you do not need to depend only on memory for size, tenderness, or location. Write down where you are in your menstrual cycle, if you are still menstruating. If you are having trouble noticing differences   in your breasts, do not get discouraged. With time you will become more familiar with the variations in your breasts and more comfortable with the exam. How often should I examine my breasts? Examine your breasts every month. If you are breastfeeding, the best time to examine your breasts is after a feeding or after using a breast pump. If you menstruate, the best time to examine your breasts is 5-7 days after your period is over. During your period, your breasts are lumpier, and it may be more difficult to notice changes. When should I see my health care provider? See your health care provider if you notice:  A change in shape or size of your breasts or nipples.  A change in the skin of your breast or nipples, such as a reddened or scaly area.  Unusual discharge from your nipples.  A lump or thick area that was not there before.  Pain in your breasts.  Anything that concerns you.  

## 2018-11-23 LAB — CYTOLOGY - PAP: Diagnosis: NEGATIVE

## 2018-11-27 ENCOUNTER — Encounter: Payer: Self-pay | Admitting: General Surgery

## 2018-12-11 DIAGNOSIS — Z79899 Other long term (current) drug therapy: Secondary | ICD-10-CM | POA: Diagnosis not present

## 2018-12-11 DIAGNOSIS — M0600 Rheumatoid arthritis without rheumatoid factor, unspecified site: Secondary | ICD-10-CM | POA: Diagnosis not present

## 2018-12-11 DIAGNOSIS — M1990 Unspecified osteoarthritis, unspecified site: Secondary | ICD-10-CM | POA: Diagnosis not present

## 2018-12-12 ENCOUNTER — Encounter: Payer: Self-pay | Admitting: Internal Medicine

## 2018-12-17 ENCOUNTER — Other Ambulatory Visit (INDEPENDENT_AMBULATORY_CARE_PROVIDER_SITE_OTHER): Payer: Medicare Other

## 2018-12-17 ENCOUNTER — Other Ambulatory Visit: Payer: Self-pay

## 2018-12-17 DIAGNOSIS — I10 Essential (primary) hypertension: Secondary | ICD-10-CM

## 2018-12-17 LAB — LIPID PANEL
Cholesterol: 199 mg/dL (ref 0–200)
HDL: 59.1 mg/dL (ref >39.00)
NonHDL: 139.96
Total CHOL/HDL Ratio: 3
Triglycerides: 204 mg/dL — ABNORMAL HIGH (ref 0.0–149.0)
VLDL: 40.8 mg/dL — ABNORMAL HIGH (ref 0.0–40.0)

## 2018-12-17 LAB — BASIC METABOLIC PANEL
BUN: 15 mg/dL (ref 6–23)
CO2: 27 mEq/L (ref 19–32)
Calcium: 9.9 mg/dL (ref 8.4–10.5)
Chloride: 103 mEq/L (ref 96–112)
Creatinine, Ser: 0.81 mg/dL (ref 0.40–1.20)
GFR: 70.19 mL/min (ref >60.00)
Glucose, Bld: 87 mg/dL (ref 70–99)
Potassium: 4.3 mEq/L (ref 3.5–5.1)
Sodium: 139 mEq/L (ref 135–145)

## 2018-12-17 LAB — LDL CHOLESTEROL, DIRECT: Direct LDL: 114 mg/dL

## 2018-12-17 LAB — TSH: TSH: 0.79 u[IU]/mL (ref 0.35–4.50)

## 2018-12-19 ENCOUNTER — Other Ambulatory Visit: Payer: Self-pay

## 2018-12-19 ENCOUNTER — Ambulatory Visit (INDEPENDENT_AMBULATORY_CARE_PROVIDER_SITE_OTHER): Payer: Medicare Other | Admitting: Internal Medicine

## 2018-12-19 ENCOUNTER — Ambulatory Visit (INDEPENDENT_AMBULATORY_CARE_PROVIDER_SITE_OTHER): Payer: Medicare Other

## 2018-12-19 VITALS — BP 122/70 | HR 70 | Temp 96.4°F | Resp 16 | Ht 65.9 in | Wt 147.0 lb

## 2018-12-19 DIAGNOSIS — I6523 Occlusion and stenosis of bilateral carotid arteries: Secondary | ICD-10-CM | POA: Diagnosis not present

## 2018-12-19 DIAGNOSIS — M542 Cervicalgia: Secondary | ICD-10-CM

## 2018-12-19 DIAGNOSIS — I773 Arterial fibromuscular dysplasia: Secondary | ICD-10-CM | POA: Diagnosis not present

## 2018-12-19 DIAGNOSIS — Z9109 Other allergy status, other than to drugs and biological substances: Secondary | ICD-10-CM

## 2018-12-19 DIAGNOSIS — M50322 Other cervical disc degeneration at C5-C6 level: Secondary | ICD-10-CM | POA: Diagnosis not present

## 2018-12-19 DIAGNOSIS — E210 Primary hyperparathyroidism: Secondary | ICD-10-CM

## 2018-12-19 DIAGNOSIS — Z0000 Encounter for general adult medical examination without abnormal findings: Secondary | ICD-10-CM

## 2018-12-19 DIAGNOSIS — K5900 Constipation, unspecified: Secondary | ICD-10-CM | POA: Diagnosis not present

## 2018-12-19 DIAGNOSIS — I701 Atherosclerosis of renal artery: Secondary | ICD-10-CM

## 2018-12-19 DIAGNOSIS — K219 Gastro-esophageal reflux disease without esophagitis: Secondary | ICD-10-CM | POA: Diagnosis not present

## 2018-12-19 DIAGNOSIS — I10 Essential (primary) hypertension: Secondary | ICD-10-CM | POA: Diagnosis not present

## 2018-12-19 DIAGNOSIS — M069 Rheumatoid arthritis, unspecified: Secondary | ICD-10-CM | POA: Diagnosis not present

## 2018-12-19 NOTE — Patient Instructions (Signed)
pepcid 20mg  - one tablet 30 minutes before your evening meal.

## 2018-12-19 NOTE — Progress Notes (Signed)
Patient ID: Victoria Moss, female   DOB: 1950/09/02, 68 y.o.   MRN: [id number]   Subjective:    Patient ID: Victoria Moss, female    DOB: March 27, 1951, 68 y.o.   MRN: [id number]  HPI  Patient with past history of RA who comes in for a scheduled physical.  Gets her breast and pelvic exams through gyn.  Saw Dr Loetta Rough 12/11/18 for f/u seronegative RA.  Prescribed MTX.  Following labs.  Feels stable overall.  Does report neck pain.  Persistent neck and shoulder pain.  No pain radiating down arms.  No numbness or tingling. No headache or dizziness.  No chest pain.  No sob.  Does report acid reflux.  Discussed treatment.  No abdominal pain.  Some constipation.  Saw Dr Gabriel Carina 02/2018 for her thyroid.  Recommended f/u and ultrasound in one year.  Discussed recent labs and calculated cholesterol risk and recommendation for cholesterol medication.  She declines.     Past Medical History:  Diagnosis Date  . Allergy   . Arthritis   . Basal cell cancer   . Diverticulosis    H/O  . GERD (gastroesophageal reflux disease)   . Heart murmur   . IBS (irritable bowel syndrome)   . Migraine    H/O, none since stopping chocolate  . Osteoporosis   . Rheumatic fever    H/O  . Status post dilation of esophageal narrowing    Past Surgical History:  Procedure Laterality Date  . APPENDECTOMY  1982  . BREAST BIOPSY Right 05/16   axillary node- neg  . CESAREAN SECTION    . COLONOSCOPY  2018  . DILATION AND CURETTAGE OF UTERUS     x2  . DILATION AND CURETTAGE, DIAGNOSTIC / THERAPEUTIC     x 2  . fatty tumor     on back x2  . HERNIA REPAIR    . parathyroid gland one removed    . PARATHYROIDECTOMY    . SKIN CANCER EXCISION     [facility]   . TOE SURGERY Left    joint replacement bit toe  . TUBAL LIGATION    . UMBILICAL HERNIA REPAIR    . UPPER GI ENDOSCOPY     Family History  Problem Relation Age of Onset  . Breast cancer Mother 30  . Hypertension Mother   . Stroke Mother        Late 103  .  Arthritis Mother   . Colon polyps Mother   . Cancer Mother        Breast  . Hypertension Father   . Hyperlipidemia Father   . Dementia Father   . Diabetes Neg Hx   . Colon cancer Neg Hx   . Esophageal cancer Neg Hx   . Kidney disease Neg Hx   . Gallbladder disease Neg Hx    Social History   Socioeconomic History  . Marital status: Widowed    Spouse name: Not on file  . Number of children: 2  . Years of education: Not on file  . Highest education level: Not on file  Occupational History  . Occupation: Retired    Fish farm manager: [employer]  Social Needs  . Financial resource strain: Not on file  . Food insecurity    Worry: Not on file    Inability: Not on file  . Transportation needs    Medical: Not on file    Non-medical: Not on file  Tobacco Use  . Smoking status: Former Smoker  Years: 7.00    Quit date: 05/09/1973    Years since quitting: 45.6  . Smokeless tobacco: Never Used  . Tobacco comment: Quit at age 67  Substance and Sexual Activity  . Alcohol use: No    Alcohol/week: 0.0 standard drinks  . Drug use: No  . Sexual activity: Not Currently  Lifestyle  . Physical activity    Days per week: Not on file    Minutes per session: Not on file  . Stress: Not on file  Relationships  . Social Herbalist on phone: Not on file    Gets together: Not on file    Attends religious service: Not on file    Active member of club or organization: Not on file    Attends meetings of clubs or organizations: Not on file    Relationship status: Not on file  Other Topics Concern  . Not on file  Social History Narrative  . Not on file    Outpatient Encounter Medications as of 12/19/2018  Medication Sig  . methotrexate (RHEUMATREX) 2.5 MG tablet Take 6  tabs (15 mg ) once a week, 12  weeks, 1 refill  . amLODipine (NORVASC) 5 MG tablet Take 1 tablet (5 mg total) by mouth daily.  Marland Kitchen CALCIUM CARB-ERGOCALCIFEROL PO Take 1 tablet by mouth daily.   Marland Kitchen dicyclomine (BENTYL) 10 MG  capsule TAKE ONE CASPULE BY MOUTH ONCE A DAY AS NEEDED AS DIRCTED BY PHYSICIAN. (Patient taking differently: Take 10 mg by mouth daily as needed for spasms. TAKE ONE CASPULE BY MOUTH ONCE A DAY AS NEEDED AS DIRCTED BY PHYSICIAN.)  . fluticasone (FLONASE) 50 MCG/ACT nasal spray Place 1 spray into both nostrils daily as needed for allergies or rhinitis.  . folic acid (FOLVITE) 1 MG tablet Take 1 mg by mouth daily.   Marland Kitchen losartan (COZAAR) 100 MG tablet TAKE ONE TABLET BY MOUTH EVERY DAY (Patient taking differently: Take 100 mg by mouth daily. )  . Multiple Vitamin (MULTI-VITAMINS) TABS Take 1 tablet by mouth daily.   . Omega-3 Fatty Acids (FISH OIL) 1000 MG CAPS Take 1,000 mg by mouth daily.   Marland Kitchen omeprazole (PRILOSEC) 20 MG capsule Take 20 mg by mouth daily.   . Turmeric Curcumin 500 MG CAPS Take 500 mg by mouth daily.  . [DISCONTINUED] cyclobenzaprine (FLEXERIL) 5 MG tablet TAKE 1 TABLET BY MOUTH AT BEDTIME AS NEEDED FOR MUSCLE SPASMS (Patient taking differently: Take 5 mg by mouth at bedtime as needed for muscle spasms. TAKE 1 TABLET BY MOUTH AT BEDTIME AS NEEDED FOR MUSCLE SPASMS)  . [DISCONTINUED] methotrexate (RHEUMATREX) 2.5 MG tablet Take 20 mg by mouth every Friday.    No facility-administered encounter medications on file as of 12/19/2018.     Review of Systems  Constitutional: Negative for appetite change and unexpected weight change.  HENT: Negative for congestion and sinus pressure.   Eyes: Negative for pain and visual disturbance.  Respiratory: Negative for cough, chest tightness and shortness of breath.   Cardiovascular: Negative for chest pain, palpitations and leg swelling.  Gastrointestinal: Positive for constipation. Negative for abdominal pain, diarrhea and nausea.  Genitourinary: Negative for difficulty urinating and dysuria.  Musculoskeletal: Negative for back pain, joint swelling and neck pain.  Skin: Negative for color change and rash.  Neurological: Negative for dizziness,  light-headedness and headaches.  Hematological: Negative for adenopathy. Does not bruise/bleed easily.  Psychiatric/Behavioral: Negative for agitation and dysphoric mood.       Objective:  Physical Exam Constitutional:      General: She is not in acute distress.    Appearance: Normal appearance.  HENT:     Right Ear: External ear normal.     Left Ear: External ear normal.  Eyes:     General: No scleral icterus.       Right eye: No discharge.        Left eye: No discharge.     Conjunctiva/sclera: Conjunctivae normal.  Neck:     Musculoskeletal: Neck supple. No muscular tenderness.     Thyroid: No thyromegaly.  Cardiovascular:     Rate and Rhythm: Normal rate and regular rhythm.  Pulmonary:     Effort: No respiratory distress.     Breath sounds: Normal breath sounds. No wheezing.  Abdominal:     General: Bowel sounds are normal.     Palpations: Abdomen is soft.     Tenderness: There is no abdominal tenderness.  Musculoskeletal:        General: No swelling or tenderness.     Comments: Increased pain/discomfort with rotation of her head from left to right.    Lymphadenopathy:     Cervical: No cervical adenopathy.  Skin:    Findings: No erythema or rash.  Neurological:     Mental Status: She is alert.  Psychiatric:        Mood and Affect: Mood normal.        Behavior: Behavior normal.     BP 122/70   Pulse 70   Temp (!) 96.4 F (35.8 C) (Temporal)   Resp 16   Wt 147 lb (66.7 kg)   LMP 05/09/2006 (Approximate)   SpO2 97%   BMI 24.05 kg/m  Wt Readings from Last 3 Encounters:  12/19/18 147 lb (66.7 kg)  11/22/18 146 lb (66.2 kg)  07/03/18 149 lb 9.6 oz (67.9 kg)     Lab Results  Component Value Date   WBC 5.8 02/06/2018   HGB 12.8 02/06/2018   HCT 37.4 02/06/2018   PLT 282.0 02/06/2018   GLUCOSE 87 12/17/2018   CHOL 199 12/17/2018   TRIG 204.0 (H) 12/17/2018   HDL 59.10 12/17/2018   LDLDIRECT 114.0 12/17/2018   LDLCALC 103 (H) 06/12/2018   ALT 23  06/12/2018   AST 18 06/12/2018   NA 139 12/17/2018   K 4.3 12/17/2018   CL 103 12/17/2018   CREATININE 0.81 12/17/2018   BUN 15 12/17/2018   CO2 27 12/17/2018   TSH 0.79 12/17/2018   HGBA1C 5.6 12/31/2014       Assessment & Plan:   Problem List Items Addressed This Visit    Carotid stenosis    Has been evaluated by [facility].        Constipation    Discussed fiber/miralax.  Follow.       Environmental allergies    Stable.       Fibromuscular dysplasia ([place])    Evaluated by [facility].  Recommended f/u in two years.  Due f/u 04/2019.        GERD (gastroesophageal reflux disease)    Acid reflux.  On omeprazole.  Add pepcid with evening meal.  Follow.  Notify me if persistent problem.        Health care maintenance    Physical today 12/19/18.  Gets her breast, pelvic and pap smears through gyn.  Mammogram 10/04/18 - Birads I.  Colonoscopy 09/06/16 - Birads I.        Hypertension    Blood pressure under  good control.  Continue same medication regimen.  Follow pressures.  Follow metabolic panel.        Relevant Orders   Hepatic function panel   Lipid panel   Basic metabolic panel   Neck pain - Primary    Persistent neck pain.  Discussed posture and sleeping position, etc.  Will check c-spine xray.        Relevant Orders   DG Cervical Spine 2 or 3 views (Completed)   Primary hyperparathyroidism ([place])    Followed by endocrinology.        Renal artery stenosis ([facility])    Evaluated by [facility].  Follow renal function.        Rheumatoid arthritis ([place])    Followed by rheumatology.  Just evaluated.  Stable.       Relevant Medications   methotrexate (RHEUMATREX) 2.5 MG tablet   Other Relevant Orders   CBC with Differential/Platelet       Einar Pheasant, MD

## 2018-12-21 ENCOUNTER — Other Ambulatory Visit: Payer: Self-pay | Admitting: Internal Medicine

## 2018-12-21 MED ORDER — CYCLOBENZAPRINE HCL 5 MG PO TABS: ORAL_TABLET | ORAL | 0 refills | Status: DC

## 2018-12-22 ENCOUNTER — Encounter: Payer: Self-pay | Admitting: Internal Medicine

## 2018-12-22 DIAGNOSIS — M542 Cervicalgia: Secondary | ICD-10-CM

## 2018-12-22 NOTE — Telephone Encounter (Signed)
Order placed for MRI c-spine.  

## 2018-12-23 ENCOUNTER — Encounter: Payer: Self-pay | Admitting: Internal Medicine

## 2018-12-23 DIAGNOSIS — K5900 Constipation, unspecified: Secondary | ICD-10-CM | POA: Insufficient documentation

## 2018-12-23 NOTE — Assessment & Plan Note (Signed)
Evaluated by [facility].  Follow renal function.

## 2018-12-23 NOTE — Assessment & Plan Note (Signed)
Physical today 12/19/18.  Gets her breast, pelvic and pap smears through gyn.  Mammogram 10/04/18 - Bir[facility] I.  Colonoscopy 09/06/16 - Bir[facility] I.

## 2018-12-23 NOTE — Assessment & Plan Note (Addendum)
Acid reflux.  On omeprazole.  Add pepcid with evening meal.  Follow.  Notify me if persistent problem.

## 2018-12-23 NOTE — Assessment & Plan Note (Signed)
Stable

## 2018-12-23 NOTE — Assessment & Plan Note (Addendum)
Persistent neck pain.  Discussed posture and sleeping position, etc.  Will check c-spine xray.

## 2018-12-23 NOTE — Assessment & Plan Note (Signed)
Followed by endocrinology 

## 2018-12-23 NOTE — Assessment & Plan Note (Signed)
Evaluated by [facility].  Recommended f/u in two years.  Due f/u 04/2019.

## 2018-12-23 NOTE — Assessment & Plan Note (Signed)
Blood pressure under good control.  Continue same medication regimen.  Follow pressures.  Follow metabolic panel.   

## 2018-12-23 NOTE — Assessment & Plan Note (Signed)
Followed by rheumatology.  Just evaluated.  Stable.

## 2018-12-23 NOTE — Assessment & Plan Note (Signed)
Has been evaluated by [facility].  

## 2018-12-23 NOTE — Assessment & Plan Note (Signed)
Discussed fiber/miralax.  Follow.

## 2018-12-31 ENCOUNTER — Other Ambulatory Visit: Payer: Self-pay | Admitting: Internal Medicine

## 2019-01-01 ENCOUNTER — Encounter: Payer: Self-pay | Admitting: Internal Medicine

## 2019-01-04 ENCOUNTER — Ambulatory Visit (INDEPENDENT_AMBULATORY_CARE_PROVIDER_SITE_OTHER): Payer: Medicare Other

## 2019-01-04 ENCOUNTER — Other Ambulatory Visit: Payer: Self-pay

## 2019-01-04 DIAGNOSIS — Z23 Encounter for immunization: Secondary | ICD-10-CM | POA: Diagnosis not present

## 2019-01-08 ENCOUNTER — Telehealth: Payer: Self-pay

## 2019-01-08 NOTE — Telephone Encounter (Signed)
Patient aware and will let us know if problems

## 2019-01-08 NOTE — Telephone Encounter (Signed)
Copied from [place] [id number]. Topic: General - Other >> Jan 08, 2019 10:48 AM Celene Kras A wrote: Reason for CRM: Pt called stating she has been getting terrible bug bites that are swollen, itchy, and burning. Pt is requesting to know if she is able to take benadryl since she is on a BP medication. Please advise.

## 2019-01-08 NOTE — Telephone Encounter (Signed)
Is there something else you recommend or are you okay with her taking benadryl?

## 2019-01-08 NOTE — Telephone Encounter (Signed)
Plain benadryl is ok to take.  If needing more 24 hour coverage, can try zyrtec or claritin (plain - no decongestant).  Also, localized bite can use 1% hydrocortisone cream topically bid.  Avoid face, breasts and genitals.  If persistent problems, let us know.

## 2019-01-10 ENCOUNTER — Other Ambulatory Visit: Payer: Self-pay

## 2019-01-10 ENCOUNTER — Ambulatory Visit (INDEPENDENT_AMBULATORY_CARE_PROVIDER_SITE_OTHER): Payer: Medicare Other | Admitting: Internal Medicine

## 2019-01-10 ENCOUNTER — Telehealth: Payer: Self-pay

## 2019-01-10 ENCOUNTER — Ambulatory Visit
Admission: RE | Admit: 2019-01-10 | Discharge: 2019-01-10 | Disposition: A | Discharge status: Home / Self Care | Payer: Medicare Other | Source: Ambulatory Visit | Attending: Internal Medicine | Admitting: Internal Medicine

## 2019-01-10 ENCOUNTER — Encounter: Payer: Self-pay | Admitting: Internal Medicine

## 2019-01-10 DIAGNOSIS — M542 Cervicalgia: Secondary | ICD-10-CM

## 2019-01-10 DIAGNOSIS — I701 Atherosclerosis of renal artery: Secondary | ICD-10-CM | POA: Diagnosis not present

## 2019-01-10 NOTE — Telephone Encounter (Signed)
Patient returned call to office and was given imaging results by [facility] RN.  Patient was advised per lab note to schedule an appt to discuss results w/ Dr. Nicki Reaper through a [facility] appt for today or tomorrow.  Unable to schedule appt for pt since Dr. Bary Leriche schedule is full.  Please advise.  Please call pt back with appt time.

## 2019-01-10 NOTE — Progress Notes (Signed)
Virtual Visit via virtual Note  This visit type was conducted due to national recommendations for restrictions regarding the COVID-19 pandemic (e.g. social distancing).  This format is felt to be most appropriate for this patient at this time.  All issues noted in this document were discussed and addressed.  No physical exam was performed (except for noted visual exam findings with Video Visits).   I connected with Victoria Moss by a video enabled telemedicine application and verified that I am speaking with the correct person using two identifiers. Location patient: home Location provider: work Persons participating in the virtual visit: patient, provider  I discussed the limitations, risks, security and privacy concerns of performing an evaluation and management service by video and the availability of in person appointments. The patient expressed understanding and agreed to proceed.   Reason for visit: work in appt  HPI: Scheduled as a work in appt today to discuss her persistent neck pain and MRI results.  She continues to have neck pain.  No pain radiating down arms.  No numbness or tingling.  Given persistent symptoms MRI obtained.  MRI revealed interval progression of mild cervical spondylosis.  Findings most pronounced at the C5-6 level where there is moderate right and mild left foraminal stenosis.  Marrow edema at the left C3-4 facet joint.  Discussed these results.  Discussed further evaluation.  Given MRI findings and persistent pain, it was decided to refer her to neurosurgery for further evaluation and treatment.  States otherwise doing relatively well.  Eating and drinking well.     ROS: See pertinent positives and negatives per HPI.  Past Medical History:  Diagnosis Date  . Allergy   . Arthritis   . Basal cell cancer   . Diverticulosis    H/O  . GERD (gastroesophageal reflux disease)   . Heart murmur   . IBS (irritable bowel syndrome)   . Migraine    H/O, none  since stopping chocolate  . Osteoporosis   . Rheumatic fever    H/O  . Status post dilation of esophageal narrowing     Past Surgical History:  Procedure Laterality Date  . APPENDECTOMY  1982  . BREAST BIOPSY Right 05/16   axillary node- neg  . CESAREAN SECTION    . COLONOSCOPY  2018  . DILATION AND CURETTAGE OF UTERUS     x2  . DILATION AND CURETTAGE, DIAGNOSTIC / THERAPEUTIC     x 2  . fatty tumor     on back x2  . HERNIA REPAIR    . parathyroid gland one removed    . PARATHYROIDECTOMY    . SKIN CANCER EXCISION     [facility]   . TOE SURGERY Left    joint replacement bit toe  . TUBAL LIGATION    . UMBILICAL HERNIA REPAIR    . UPPER GI ENDOSCOPY      Family History  Problem Relation Age of Onset  . Breast cancer Mother 10  . Hypertension Mother   . Stroke Mother        Late 51  . Arthritis Mother   . Colon polyps Mother   . Cancer Mother        Breast  . Hypertension Father   . Hyperlipidemia Father   . Dementia Father   . Diabetes Neg Hx   . Colon cancer Neg Hx   . Esophageal cancer Neg Hx   . Kidney disease Neg Hx   . Gallbladder disease Neg Hx  SOCIAL HX: reviewed.    Current Outpatient Medications:  .  amLODipine (NORVASC) 5 MG tablet, Take 1 tablet (5 mg total) by mouth daily., Disp: 30 tablet, Rfl: 5 .  CALCIUM CARB-ERGOCALCIFEROL PO, Take 1 tablet by mouth daily. , Disp: , Rfl:  .  cyclobenzaprine (FLEXERIL) 5 MG tablet, TAKE 1 TABLET BY MOUTH AT BEDTIME AS NEEDED FOR MUSCLE SPASMS, Disp: 30 tablet, Rfl: 0 .  dicyclomine (BENTYL) 10 MG capsule, TAKE 1 CAPSULE BY MOUTH ONCE DAILY AS NEEDED AS DIRECTED BY PHYSICIAN, Disp: 90 capsule, Rfl: 1 .  fluticasone (FLONASE) 50 MCG/ACT nasal spray, Place 1 spray into both nostrils daily as needed for allergies or rhinitis., Disp: , Rfl:  .  folic acid (FOLVITE) 1 MG tablet, Take 1 mg by mouth daily. , Disp: , Rfl:  .  losartan (COZAAR) 100 MG tablet, TAKE ONE TABLET BY MOUTH EVERY DAY (Patient taking  differently: Take 100 mg by mouth daily. ), Disp: 30 tablet, Rfl: 3 .  methotrexate (RHEUMATREX) 2.5 MG tablet, Take 6  tabs (15 mg ) once a week, 12  weeks, 1 refill, Disp: , Rfl:  .  Multiple Vitamin (MULTI-VITAMINS) TABS, Take 1 tablet by mouth daily. , Disp: , Rfl:  .  Omega-3 Fatty Acids (FISH OIL) 1000 MG CAPS, Take 1,000 mg by mouth daily. , Disp: , Rfl:  .  omeprazole (PRILOSEC) 20 MG capsule, Take 20 mg by mouth daily. , Disp: , Rfl:  .  Turmeric Curcumin 500 MG CAPS, Take 500 mg by mouth daily., Disp: , Rfl:   EXAM:  GENERAL: alert, oriented, appears well and in no acute distress  HEENT: atraumatic, conjunttiva clear, no obvious abnormalities on inspection of external nose and ears  NECK: normal movements of the head and neck  LUNGS: on inspection no signs of respiratory distress, breathing rate appears normal, no obvious gross SOB, gasping or wheezing  CV: no obvious cyanosis  PSYCH/NEURO: pleasant and cooperative, no obvious depression or anxiety, speech and thought processing grossly intact  ASSESSMENT AND PLAN:  Discussed the following assessment and plan:  Neck pain Persistent neck pain as outlined.  MRI as outlined.  Given persistent pain and MRI findings, will have neurosurgery evaluate for further treatment.  Pt in agreement.      I discussed the assessment and treatment plan with the patient. The patient was provided an opportunity to ask questions and all were answered. The patient agreed with the plan and demonstrated an understanding of the instructions.   The patient was advised to call back or seek an in-person evaluation if the symptoms worsen or if the condition fails to improve as anticipated.   Einar Pheasant, MD

## 2019-01-11 NOTE — Telephone Encounter (Signed)
Pt did visit yesterday

## 2019-01-14 ENCOUNTER — Encounter: Payer: Self-pay | Admitting: Internal Medicine

## 2019-01-14 NOTE — Assessment & Plan Note (Signed)
Persistent neck pain as outlined.  MRI as outlined.  Given persistent pain and MRI findings, will have neurosurgery evaluate for further treatment.  Pt in agreement.

## 2019-01-25 DIAGNOSIS — F419 Anxiety disorder, unspecified: Secondary | ICD-10-CM | POA: Diagnosis not present

## 2019-01-29 DIAGNOSIS — M5030 Other cervical disc degeneration, unspecified cervical region: Secondary | ICD-10-CM | POA: Diagnosis not present

## 2019-01-29 DIAGNOSIS — M4802 Spinal stenosis, cervical region: Secondary | ICD-10-CM | POA: Diagnosis not present

## 2019-01-31 ENCOUNTER — Other Ambulatory Visit: Payer: Self-pay | Admitting: Internal Medicine

## 2019-02-05 DIAGNOSIS — F419 Anxiety disorder, unspecified: Secondary | ICD-10-CM | POA: Diagnosis not present

## 2019-02-06 DIAGNOSIS — M4802 Spinal stenosis, cervical region: Secondary | ICD-10-CM | POA: Diagnosis not present

## 2019-02-06 DIAGNOSIS — R531 Weakness: Secondary | ICD-10-CM | POA: Diagnosis not present

## 2019-02-08 DIAGNOSIS — E042 Nontoxic multinodular goiter: Secondary | ICD-10-CM | POA: Diagnosis not present

## 2019-02-12 ENCOUNTER — Other Ambulatory Visit: Payer: Self-pay

## 2019-02-12 ENCOUNTER — Encounter: Payer: Self-pay | Admitting: General Surgery

## 2019-02-12 ENCOUNTER — Ambulatory Visit (INDEPENDENT_AMBULATORY_CARE_PROVIDER_SITE_OTHER): Payer: Medicare Other | Admitting: General Surgery

## 2019-02-12 VITALS — BP 136/78 | HR 81 | Temp 97.7°F | Ht 65.5 in | Wt 142.0 lb

## 2019-02-12 DIAGNOSIS — R531 Weakness: Secondary | ICD-10-CM | POA: Diagnosis not present

## 2019-02-12 DIAGNOSIS — M4802 Spinal stenosis, cervical region: Secondary | ICD-10-CM | POA: Diagnosis not present

## 2019-02-12 DIAGNOSIS — D171 Benign lipomatous neoplasm of skin and subcutaneous tissue of trunk: Secondary | ICD-10-CM | POA: Diagnosis not present

## 2019-02-12 NOTE — Patient Instructions (Signed)
Call once ready to schedule.

## 2019-02-12 NOTE — Progress Notes (Signed)
Patient ID: Victoria Moss, female   DOB: 08/10/1950, 68 y.o.   MRN: [id number]  Chief Complaint  Patient presents with  . Other    lipoma of back    HPI Victoria Moss is a 68 y.o. female.   She was last seen by Dr. Bary Castilla in February of this year.  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  This is been excised twice in the past, but has grown back.  The last attempt at excision was about 10 to 15 years ago in [place], where she resided prior to moving to [place].  Surgical reexcision was planned for April, but secondary to COVID-19, the procedure was deferred.  She states that there is some soreness with pressure in the area and it rubs against her bra strap.  She denies it ever having become hot, red, or drained any fluid.  She states that she is not sure that she is ready to undergo surgical treatment at this time, secondary to a number of other health concerns as well as the health of her 28 year old father.   Past Medical History:  Diagnosis Date  . Allergy   . Arthritis   . Basal cell cancer   . Diverticulosis    H/O  . GERD (gastroesophageal reflux disease)   . Heart murmur   . IBS (irritable bowel syndrome)   . Migraine    H/O, none since stopping chocolate  . Osteoporosis   . Rheumatic fever    H/O  . Status post dilation of esophageal narrowing     Past Surgical History:  Procedure Laterality Date  . APPENDECTOMY  1982  . BREAST BIOPSY Right 05/16   axillary node- neg  . CESAREAN SECTION    . COLONOSCOPY  2018  . DILATION AND CURETTAGE OF UTERUS     x2  . DILATION AND CURETTAGE, DIAGNOSTIC / THERAPEUTIC     x 2  . fatty tumor     on back x2  . HERNIA REPAIR    . parathyroid gland one removed    . PARATHYROIDECTOMY    . SKIN CANCER EXCISION     BCCA   . TOE SURGERY Left    joint replacement bit toe  . TUBAL LIGATION    . UMBILICAL HERNIA REPAIR    . UPPER GI ENDOSCOPY      Family History  Problem Relation Age of  Onset  . Breast cancer Mother 83  . Hypertension Mother   . Stroke Mother        Late 52  . Arthritis Mother   . Colon polyps Mother   . Cancer Mother        Breast  . Hypertension Father   . Hyperlipidemia Father   . Dementia Father   . Diabetes Neg Hx   . Colon cancer Neg Hx   . Esophageal cancer Neg Hx   . Kidney disease Neg Hx   . Gallbladder disease Neg Hx     Social History Social History   Tobacco Use  . Smoking status: Former Smoker    Years: 7.00    Quit date: 05/09/1973    Years since quitting: 45.7  . Smokeless tobacco: Never Used  . Tobacco comment: Quit at age 72  Substance Use Topics  . Alcohol use: No    Alcohol/week: 0.0 standard drinks  . Drug use: No    Allergies  Allergen Reactions  . Codeine Itching  . Lac  Bovis Diarrhea    Abdominal Pain  . Penicillins Itching    Did it involve swelling of the face/tongue/throat, SOB, or low BP? No Did it involve sudden or severe rash/hives, skin peeling, or any reaction on the inside of your mouth or nose? No Did you need to seek medical attention at a hospital or doctor's office? No When did it last happen?40 + years ago If all above answers are "NO", may proceed with cephalosporin use.   Marland Kitchen Propofol Nausea And Vomiting    Current Outpatient Medications  Medication Sig Dispense Refill  . amLODipine (NORVASC) 5 MG tablet TAKE 1 TABLET BY MOUTH DAILY 30 tablet 5  . CALCIUM CARB-ERGOCALCIFEROL PO Take 1 tablet by mouth daily.     . cyclobenzaprine (FLEXERIL) 5 MG tablet TAKE 1 TABLET BY MOUTH AT BEDTIME AS NEEDED FOR MUSCLE SPASMS 30 tablet 0  . dicyclomine (BENTYL) 10 MG capsule TAKE 1 CAPSULE BY MOUTH ONCE DAILY AS NEEDED AS DIRECTED BY PHYSICIAN 90 capsule 1  . fluticasone (FLONASE) 50 MCG/ACT nasal spray Place 1 spray into both nostrils daily as needed for allergies or rhinitis.    . folic acid (FOLVITE) 1 MG tablet Take 1 mg by mouth daily.     Marland Kitchen losartan (COZAAR) 100 MG tablet TAKE ONE TABLET BY  MOUTH EVERY DAY (Patient taking differently: Take 100 mg by mouth daily. ) 30 tablet 3  . methotrexate (RHEUMATREX) 2.5 MG tablet Take 6  tabs (15 mg ) once a week, 12  weeks, 1 refill    . Multiple Vitamin (MULTI-VITAMINS) TABS Take 1 tablet by mouth daily.     . Omega-3 Fatty Acids (FISH OIL) 1000 MG CAPS Take 1,000 mg by mouth daily.     Marland Kitchen omeprazole (PRILOSEC) 20 MG capsule Take 20 mg by mouth daily.     . Turmeric Curcumin 500 MG CAPS Take 500 mg by mouth daily.     No current facility-administered medications for this visit.     Review of Systems Review of Systems  Gastrointestinal: Positive for abdominal pain.       States she has an ulcer.  Musculoskeletal: Positive for neck pain.  All other systems reviewed and are negative.   Blood pressure 136/78, pulse 81, temperature 97.7 F (36.5 C), height 5' 5.5" (1.664 m), weight 142 lb (64.4 kg), last menstrual period 05/09/2006, SpO2 97 %.  Physical Exam Physical Exam Constitutional:      General: She is not in acute distress.    Appearance: Normal appearance. She is normal weight.  HENT:     Head: Normocephalic and atraumatic.     Nose:     Comments: Covered with a mask secondary to COVID-19 precautions    Mouth/Throat:     Comments: Covered with a mask secondary to COVID-19 precautions Eyes:     General: No scleral icterus.       Right eye: No discharge.        Left eye: No discharge.     Conjunctiva/sclera: Conjunctivae normal.  Neck:     Musculoskeletal: Normal range of motion.     Comments: The thyroid is palpable with multiple nodules.  The gland moves freely with deglutition.  She has a transverse cervical scar from prior parathyroidectomy.  It is well-healed.  There is no palpable cervical or supraclavicular lymphadenopathy. Cardiovascular:     Rate and Rhythm: Normal rate and regular rhythm.     Pulses: Normal pulses.     Heart sounds: Normal heart  sounds.  Pulmonary:     Effort: Pulmonary effort is normal.      Breath sounds: Normal breath sounds.  Abdominal:     General: Abdomen is flat.     Palpations: Abdomen is soft.  Genitourinary:    Comments: Deferred Musculoskeletal:        General: No swelling or deformity.       Arms:     Comments: There is an approximately 10 cm ovoid, soft, fatty mass just caudal to the tip of the left scapula.  There are crisscrossing scars present.  The mass is well-circumscribed and freely mobile.  Skin:    General: Skin is warm and dry.  Neurological:     General: No focal deficit present.     Mental Status: She is alert and oriented to person, place, and time.  Psychiatric:        Behavior: Behavior normal.     Data Reviewed I reviewed Dr. Barbette Hair note from July 03, 2018.  This describes a similar appearing mass and plans for potential resection.  There are no imaging studies available for review.  Assessment This is a 68 year old woman with a lipoma on her back.  2 attempts at excision have been made in the past.  It is somewhat irritating to her, but at this time, she feels that she has other more pressing health issues to address.  Plan She would like to contact us for future resection when she feels like the other issues (namely her neck pain and stomach ulcer) have been more completely dealt with.  For now, we will see her on an as-needed basis.    Fredirick Maudlin 02/12/2019, 10:47 AM

## 2019-02-13 ENCOUNTER — Other Ambulatory Visit: Payer: Self-pay | Admitting: Gastroenterology

## 2019-02-13 ENCOUNTER — Other Ambulatory Visit (HOSPITAL_COMMUNITY): Payer: Self-pay | Admitting: Gastroenterology

## 2019-02-13 DIAGNOSIS — R1012 Left upper quadrant pain: Secondary | ICD-10-CM

## 2019-02-13 DIAGNOSIS — R197 Diarrhea, unspecified: Secondary | ICD-10-CM | POA: Diagnosis not present

## 2019-02-13 DIAGNOSIS — R1032 Left lower quadrant pain: Secondary | ICD-10-CM | POA: Diagnosis not present

## 2019-02-13 DIAGNOSIS — R194 Change in bowel habit: Secondary | ICD-10-CM | POA: Diagnosis not present

## 2019-02-14 ENCOUNTER — Other Ambulatory Visit
Admission: RE | Admit: 2019-02-14 | Discharge: 2019-02-14 | Disposition: A | Discharge status: Home / Self Care | Payer: Medicare Other | Source: Ambulatory Visit | Attending: Gastroenterology | Admitting: Gastroenterology

## 2019-02-14 DIAGNOSIS — R197 Diarrhea, unspecified: Secondary | ICD-10-CM | POA: Insufficient documentation

## 2019-02-14 DIAGNOSIS — F419 Anxiety disorder, unspecified: Secondary | ICD-10-CM | POA: Diagnosis not present

## 2019-02-14 DIAGNOSIS — R1032 Left lower quadrant pain: Secondary | ICD-10-CM | POA: Diagnosis not present

## 2019-02-14 LAB — C DIFFICILE QUICK SCREEN W PCR REFLEX
C Diff antigen: NEGATIVE
C Diff interpretation: NOT DETECTED
C Diff toxin: NEGATIVE

## 2019-02-15 DIAGNOSIS — Z8639 Personal history of other endocrine, nutritional and metabolic disease: Secondary | ICD-10-CM | POA: Diagnosis not present

## 2019-02-15 DIAGNOSIS — E042 Nontoxic multinodular goiter: Secondary | ICD-10-CM | POA: Diagnosis not present

## 2019-02-15 DIAGNOSIS — M810 Age-related osteoporosis without current pathological fracture: Secondary | ICD-10-CM | POA: Diagnosis not present

## 2019-02-16 ENCOUNTER — Other Ambulatory Visit: Payer: Self-pay | Admitting: Internal Medicine

## 2019-02-17 LAB — GI PATHOGEN PANEL BY PCR, STOOL

## 2019-02-20 DIAGNOSIS — F419 Anxiety disorder, unspecified: Secondary | ICD-10-CM | POA: Diagnosis not present

## 2019-02-21 ENCOUNTER — Ambulatory Visit
Admission: RE | Admit: 2019-02-21 | Discharge: 2019-02-21 | Disposition: A | Discharge status: Home / Self Care | Payer: Medicare Other | Source: Ambulatory Visit | Attending: Gastroenterology | Admitting: Gastroenterology

## 2019-02-21 ENCOUNTER — Other Ambulatory Visit: Payer: Self-pay

## 2019-02-21 DIAGNOSIS — R1012 Left upper quadrant pain: Secondary | ICD-10-CM | POA: Diagnosis not present

## 2019-02-21 DIAGNOSIS — R1032 Left lower quadrant pain: Secondary | ICD-10-CM | POA: Diagnosis not present

## 2019-02-21 HISTORY — DX: Essential (primary) hypertension: I10

## 2019-02-21 MED ORDER — IOHEXOL 300 MG/ML  SOLN
100.0000 mL | Freq: Once | INTRAMUSCULAR | Status: AC | PRN
  Administered 2019-02-21: 100 mL via INTRAVENOUS

## 2019-02-25 DIAGNOSIS — M5412 Radiculopathy, cervical region: Secondary | ICD-10-CM | POA: Diagnosis not present

## 2019-02-25 DIAGNOSIS — M5030 Other cervical disc degeneration, unspecified cervical region: Secondary | ICD-10-CM | POA: Diagnosis not present

## 2019-02-28 DIAGNOSIS — F419 Anxiety disorder, unspecified: Secondary | ICD-10-CM | POA: Diagnosis not present

## 2019-03-04 DIAGNOSIS — R531 Weakness: Secondary | ICD-10-CM | POA: Diagnosis not present

## 2019-03-04 DIAGNOSIS — M4802 Spinal stenosis, cervical region: Secondary | ICD-10-CM | POA: Diagnosis not present

## 2019-03-07 DIAGNOSIS — F419 Anxiety disorder, unspecified: Secondary | ICD-10-CM | POA: Diagnosis not present

## 2019-03-09 ENCOUNTER — Ambulatory Visit: Admit: 2019-03-09 | Payer: BC Managed Care – PPO | Admitting: General Surgery

## 2019-03-09 SURGERY — EXCISION LIPOMA
Anesthesia: General | Laterality: Left

## 2019-03-11 DIAGNOSIS — R531 Weakness: Secondary | ICD-10-CM | POA: Diagnosis not present

## 2019-03-11 DIAGNOSIS — M4802 Spinal stenosis, cervical region: Secondary | ICD-10-CM | POA: Diagnosis not present

## 2019-03-12 DIAGNOSIS — M5412 Radiculopathy, cervical region: Secondary | ICD-10-CM | POA: Diagnosis not present

## 2019-03-14 DIAGNOSIS — F419 Anxiety disorder, unspecified: Secondary | ICD-10-CM | POA: Diagnosis not present

## 2019-03-28 ENCOUNTER — Encounter (INDEPENDENT_AMBULATORY_CARE_PROVIDER_SITE_OTHER): Payer: Medicare Other

## 2019-03-28 ENCOUNTER — Ambulatory Visit (INDEPENDENT_AMBULATORY_CARE_PROVIDER_SITE_OTHER): Payer: Medicare Other | Admitting: Vascular Surgery

## 2019-04-17 ENCOUNTER — Other Ambulatory Visit: Payer: Self-pay

## 2019-04-17 ENCOUNTER — Telehealth: Payer: Self-pay | Admitting: Internal Medicine

## 2019-04-17 DIAGNOSIS — R300 Dysuria: Secondary | ICD-10-CM

## 2019-04-17 NOTE — Telephone Encounter (Signed)
Pt passed the screening.

## 2019-04-17 NOTE — Telephone Encounter (Signed)
Called patient. She is going to come in between 10-12 to leave a urine sample. Future orders have been placed.

## 2019-04-18 ENCOUNTER — Ambulatory Visit (INDEPENDENT_AMBULATORY_CARE_PROVIDER_SITE_OTHER): Payer: Medicare Other | Admitting: Internal Medicine

## 2019-04-18 DIAGNOSIS — I701 Atherosclerosis of renal artery: Secondary | ICD-10-CM | POA: Diagnosis not present

## 2019-04-18 DIAGNOSIS — R350 Frequency of micturition: Secondary | ICD-10-CM

## 2019-04-18 DIAGNOSIS — R300 Dysuria: Secondary | ICD-10-CM

## 2019-04-18 LAB — URINALYSIS, ROUTINE W REFLEX MICROSCOPIC
Bilirubin Urine: NEGATIVE
Hgb urine dipstick: NEGATIVE
Ketones, ur: NEGATIVE
Leukocytes,Ua: NEGATIVE
Nitrite: NEGATIVE
RBC / HPF: NONE SEEN (ref 0–?)
Specific Gravity, Urine: <=1.005 — AB (ref 1.000–1.030)
Total Protein, Urine: NEGATIVE
Urine Glucose: NEGATIVE
Urobilinogen, UA: 0.2 (ref 0.0–1.0)
WBC, UA: NONE SEEN (ref 0–?)
pH: 6.5 (ref 5.0–8.0)

## 2019-04-18 MED ORDER — PHENAZOPYRIDINE HCL 200 MG PO TABS: 200.0000 mg | ORAL_TABLET | Freq: Three times a day (TID) | ORAL | 0 refills | Status: DC | PRN

## 2019-04-18 NOTE — Progress Notes (Signed)
Patient ID: Victoria Moss, female   DOB: 1950/05/16, 68 y.o.   MRN: [id number]   Virtual Visit via video Note  This visit type was conducted due to national recommendations for restrictions regarding the COVID-19 pandemic (e.g. social distancing).  This format is felt to be most appropriate for this patient at this time.  All issues noted in this document were discussed and addressed.  No physical exam was performed (except for noted visual exam findings with Video Visits).   I connected with Victoria Moss by a video enabled telemedicine application and verified that I am speaking with the correct person using two identifiers. Location patient: home Location provider: work Persons participating in the virtual visit: patient, provider  I discussed the limitations, risks, security and privacy concerns of performing an evaluation and management service by video and the availability of in person appointments.  The patient expressed understanding and agreed to proceed.   Reason for visit: work in appt  HPI: She reports symptoms started over the last two days.  Noticed pressure above pelvic bone - left.  Some increased urinary frequency.  No dysuria.  No fever.  No nausea, vomiting or diarrhea.  No abdominal pain.  No hematuria.  Some minimal itching - vaginal area, but no discharge or intravaginal itching.  No back pain. Has increased fluid intake and juice intake.     ROS: See pertinent positives and negatives per HPI.  Past Medical History:  Diagnosis Date  . Allergy   . Arthritis   . Basal cell cancer   . Diverticulosis    H/O  . GERD (gastroesophageal reflux disease)   . Heart murmur   . Hypertension   . IBS (irritable bowel syndrome)   . Migraine    H/O, none since stopping chocolate  . Osteoporosis   . Rheumatic fever    H/O  . Status post dilation of esophageal narrowing     Past Surgical History:  Procedure Laterality Date  . APPENDECTOMY  1982  . BREAST  BIOPSY Right 05/16   axillary node- neg  . CESAREAN SECTION    . COLONOSCOPY  2018  . DILATION AND CURETTAGE OF UTERUS     x2  . DILATION AND CURETTAGE, DIAGNOSTIC / THERAPEUTIC     x 2  . fatty tumor     on back x2  . HERNIA REPAIR    . parathyroid gland one removed    . PARATHYROIDECTOMY    . SKIN CANCER EXCISION     BCCA   . TOE SURGERY Left    joint replacement bit toe  . TUBAL LIGATION    . UMBILICAL HERNIA REPAIR    . UPPER GI ENDOSCOPY      Family History  Problem Relation Age of Onset  . Breast cancer Mother 27  . Hypertension Mother   . Stroke Mother        Late 40  . Arthritis Mother   . Colon polyps Mother   . Cancer Mother        Breast  . Hypertension Father   . Hyperlipidemia Father   . Dementia Father   . Diabetes Neg Hx   . Colon cancer Neg Hx   . Esophageal cancer Neg Hx   . Kidney disease Neg Hx   . Gallbladder disease Neg Hx     SOCIAL HX: reviewed.    Current Outpatient Medications:  .  amLODipine (NORVASC) 5 MG tablet, TAKE 1 TABLET BY MOUTH DAILY, Disp: 30  tablet, Rfl: 5 .  CALCIUM CARB-ERGOCALCIFEROL PO, Take 1 tablet by mouth daily. , Disp: , Rfl:  .  cyclobenzaprine (FLEXERIL) 5 MG tablet, TAKE 1 TABLET BY MOUTH AT BEDTIME AS NEEDED FOR MUSCLE SPASMS, Disp: 30 tablet, Rfl: 0 .  dicyclomine (BENTYL) 10 MG capsule, TAKE 1 CAPSULE BY MOUTH ONCE DAILY AS NEEDED AS DIRECTED BY PHYSICIAN, Disp: 90 capsule, Rfl: 1 .  fluticasone (FLONASE) 50 MCG/ACT nasal spray, Place 1 spray into both nostrils daily as needed for allergies or rhinitis., Disp: , Rfl:  .  losartan (COZAAR) 100 MG tablet, TAKE ONE TABLET BY MOUTH EVERY DAY, Disp: 30 tablet, Rfl: 3 .  methotrexate (RHEUMATREX) 2.5 MG tablet, Take 6  tabs (15 mg ) once a week, 12  weeks, 1 refill, Disp: , Rfl:  .  Multiple Vitamin (MULTI-VITAMINS) TABS, Take 1 tablet by mouth daily. , Disp: , Rfl:  .  Omega-3 Fatty Acids (FISH OIL) 1000 MG CAPS, Take 1,000 mg by mouth daily. , Disp: , Rfl:  .   omeprazole (PRILOSEC) 20 MG capsule, Take 20 mg by mouth daily. , Disp: , Rfl:  .  phenazopyridine (PYRIDIUM) 200 MG tablet, Take 1 tablet (200 mg total) by mouth 3 (three) times daily as needed for pain., Disp: 6 tablet, Rfl: 0 .  Turmeric Curcumin 500 MG CAPS, Take 500 mg by mouth daily., Disp: , Rfl:   EXAM:  GENERAL: alert, oriented, appears well and in no acute distress  HEENT: atraumatic, conjunttiva clear, no obvious abnormalities on inspection of external nose and ears  NECK: normal movements of the head and neck  LUNGS: on inspection no signs of respiratory distress, breathing rate appears normal, no obvious gross SOB, gasping or wheezing  CV: no obvious cyanosis  PSYCH/NEURO: pleasant and cooperative, no obvious depression or anxiety, speech and thought processing grossly intact  ASSESSMENT AND PLAN:  Discussed the following assessment and plan:  Urinary frequency Urinary frequency and pressure as outlined.  No dysuria - like usual UTI.  Urinalysis clear.  Will send for culture.  Hold abx.  Pyridium as directed.  Will notify her of culture results once available.      I discussed the assessment and treatment plan with the patient. The patient was provided an opportunity to ask questions and all were answered. The patient agreed with the plan and demonstrated an understanding of the instructions.   The patient was advised to call back or seek an in-person evaluation if the symptoms worsen or if the condition fails to improve as anticipated.    Einar Pheasant, MD

## 2019-04-20 ENCOUNTER — Encounter: Payer: Self-pay | Admitting: Internal Medicine

## 2019-04-20 LAB — URINE CULTURE
MICRO NUMBER:: 1184881
Result:: NO GROWTH
SPECIMEN QUALITY:: ADEQUATE

## 2019-04-21 ENCOUNTER — Encounter: Payer: Self-pay | Admitting: Internal Medicine

## 2019-04-21 DIAGNOSIS — R350 Frequency of micturition: Secondary | ICD-10-CM | POA: Insufficient documentation

## 2019-04-21 NOTE — Assessment & Plan Note (Signed)
Urinary frequency and pressure as outlined.  No dysuria - like usual UTI.  Urinalysis clear.  Will send for culture.  Hold abx.  Pyridium as directed.  Will notify her of culture results once available.

## 2019-05-16 DIAGNOSIS — F419 Anxiety disorder, unspecified: Secondary | ICD-10-CM | POA: Diagnosis not present

## 2019-06-04 DIAGNOSIS — F419 Anxiety disorder, unspecified: Secondary | ICD-10-CM | POA: Diagnosis not present

## 2019-06-05 DIAGNOSIS — Z23 Encounter for immunization: Secondary | ICD-10-CM | POA: Diagnosis not present

## 2019-06-14 ENCOUNTER — Other Ambulatory Visit: Payer: Self-pay | Admitting: Internal Medicine

## 2019-06-19 DIAGNOSIS — F419 Anxiety disorder, unspecified: Secondary | ICD-10-CM | POA: Diagnosis not present

## 2019-06-21 DIAGNOSIS — I10 Essential (primary) hypertension: Secondary | ICD-10-CM | POA: Diagnosis not present

## 2019-06-21 DIAGNOSIS — H2512 Age-related nuclear cataract, left eye: Secondary | ICD-10-CM | POA: Diagnosis not present

## 2019-06-21 DIAGNOSIS — H25811 Combined forms of age-related cataract, right eye: Secondary | ICD-10-CM | POA: Diagnosis not present

## 2019-06-21 DIAGNOSIS — H35033 Hypertensive retinopathy, bilateral: Secondary | ICD-10-CM | POA: Diagnosis not present

## 2019-06-24 ENCOUNTER — Ambulatory Visit (INDEPENDENT_AMBULATORY_CARE_PROVIDER_SITE_OTHER): Payer: Medicare Other | Admitting: Internal Medicine

## 2019-06-24 DIAGNOSIS — I701 Atherosclerosis of renal artery: Secondary | ICD-10-CM

## 2019-06-24 DIAGNOSIS — Z9109 Other allergy status, other than to drugs and biological substances: Secondary | ICD-10-CM

## 2019-06-24 DIAGNOSIS — K5900 Constipation, unspecified: Secondary | ICD-10-CM | POA: Diagnosis not present

## 2019-06-24 DIAGNOSIS — I10 Essential (primary) hypertension: Secondary | ICD-10-CM | POA: Diagnosis not present

## 2019-06-24 DIAGNOSIS — M542 Cervicalgia: Secondary | ICD-10-CM

## 2019-06-24 DIAGNOSIS — Z8371 Family history of colonic polyps: Secondary | ICD-10-CM | POA: Diagnosis not present

## 2019-06-24 DIAGNOSIS — Z83719 Family history of colon polyps, unspecified: Secondary | ICD-10-CM

## 2019-06-24 DIAGNOSIS — K219 Gastro-esophageal reflux disease without esophagitis: Secondary | ICD-10-CM | POA: Diagnosis not present

## 2019-06-24 DIAGNOSIS — M069 Rheumatoid arthritis, unspecified: Secondary | ICD-10-CM

## 2019-06-24 DIAGNOSIS — E210 Primary hyperparathyroidism: Secondary | ICD-10-CM | POA: Diagnosis not present

## 2019-06-24 MED ORDER — CYCLOBENZAPRINE HCL 5 MG PO TABS: ORAL_TABLET | ORAL | 0 refills | Status: DC

## 2019-06-24 MED ORDER — DICYCLOMINE HCL 10 MG PO CAPS: ORAL_CAPSULE | ORAL | 1 refills | Status: AC

## 2019-06-24 NOTE — Progress Notes (Signed)
Patient ID: Victoria Moss, female   DOB: 09-07-1950, 69 y.o.   MRN: [id number]   Virtual Visit via video Note  This visit type was conducted due to national recommendations for restrictions regarding the COVID-19 pandemic (e.g. social distancing).  This format is felt to be most appropriate for this patient at this time.  All issues noted in this document were discussed and addressed.  No physical exam was performed (except for noted visual exam findings with Video Visits).   I connected with Verneda Salamat by a video enabled telemedicine application  and verified that I am speaking with the correct person using two identifiers. Location patient: home Location provider: work  Persons participating in the virtual visit: patient, provider  The limitations, risks, security and privacy concerns of performing an evaluation and management service by video and the availability of in person appointments have been discussed.  The patient expressed understanding and agreed to proceed.   Reason for visit: scheduled follow up.    HPI: Has been having neck pain.  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  Had recommended injection and physical therapy.  She request referral to [facility].  Also saw Dr Gabriel Carina.  Stable - thyroid nodule.  Did not recommend routine imaging.  Trying to stay active.  No chest pain or sob reported.  Eating.  No nausea, vomiting or abdominal pain reported.  Bowels not as regular.  Smaller and harder - at times.  Started weight watchers has lost some weight.  Discussed acid reflux.  On omperazole.  Discussed adding pepcid last visit.  Handling stress.     ROS: See pertinent positives and negatives per HPI.  Past Medical History:  Diagnosis Date  . Allergy   . Arthritis   . Basal cell cancer   . Diverticulosis    H/O  . GERD (gastroesophageal reflux disease)   . Heart murmur   . Hypertension   . IBS (irritable bowel syndrome)   . Migraine    H/O, none since  stopping chocolate  . Osteoporosis   . Rheumatic fever    H/O  . Status post dilation of esophageal narrowing     Past Surgical History:  Procedure Laterality Date  . APPENDECTOMY  1982  . BREAST BIOPSY Right 05/16   axillary node- neg  . CESAREAN SECTION    . COLONOSCOPY  2018  . DILATION AND CURETTAGE OF UTERUS     x2  . DILATION AND CURETTAGE, DIAGNOSTIC / THERAPEUTIC     x 2  . fatty tumor     on back x2  . HERNIA REPAIR    . parathyroid gland one removed    . PARATHYROIDECTOMY    . SKIN CANCER EXCISION     BCCA   . TOE SURGERY Left    joint replacement bit toe  . TUBAL LIGATION    . UMBILICAL HERNIA REPAIR    . UPPER GI ENDOSCOPY      Family History  Problem Relation Age of Onset  . Breast cancer Mother 34  . Hypertension Mother   . Stroke Mother        Late 27  . Arthritis Mother   . Colon polyps Mother   . Cancer Mother        Breast  . Hypertension Father   . Hyperlipidemia Father   . Dementia Father   . Diabetes Neg Hx   . Colon cancer Neg Hx   . Esophageal cancer Neg Hx   .  Kidney disease Neg Hx   . Gallbladder disease Neg Hx     SOCIAL HX: reviewed.    Current Outpatient Medications:  .  amLODipine (NORVASC) 5 MG tablet, TAKE 1 TABLET BY MOUTH DAILY, Disp: 30 tablet, Rfl: 5 .  CALCIUM CARB-ERGOCALCIFEROL PO, Take 1 tablet by mouth daily. , Disp: , Rfl:  .  cyclobenzaprine (FLEXERIL) 5 MG tablet, TAKE 1 TABLET BY MOUTH AT BEDTIME AS NEEDED FOR MUSCLE SPASMS, Disp: 30 tablet, Rfl: 0 .  dicyclomine (BENTYL) 10 MG capsule, TAKE 1 CAPSULE BY MOUTH ONCE DAILY AS NEEDED AS DIRECTED BY PHYSICIAN, Disp: 90 capsule, Rfl: 1 .  fluticasone (FLONASE) 50 MCG/ACT nasal spray, Place 1 spray into both nostrils daily as needed for allergies or rhinitis., Disp: , Rfl:  .  losartan (COZAAR) 100 MG tablet, TAKE ONE TABLET BY MOUTH EVERY DAY, Disp: 30 tablet, Rfl: 3 .  methotrexate (RHEUMATREX) 2.5 MG tablet, Take 6  tabs (15 mg ) once a week, 12  weeks, 1 refill,  Disp: , Rfl:  .  Multiple Vitamin (MULTI-VITAMINS) TABS, Take 1 tablet by mouth daily. , Disp: , Rfl:  .  Omega-3 Fatty Acids (FISH OIL) 1000 MG CAPS, Take 1,000 mg by mouth daily. , Disp: , Rfl:  .  omeprazole (PRILOSEC) 20 MG capsule, Take 20 mg by mouth daily. , Disp: , Rfl:  .  phenazopyridine (PYRIDIUM) 200 MG tablet, Take 1 tablet (200 mg total) by mouth 3 (three) times daily as needed for pain., Disp: 6 tablet, Rfl: 0 .  Turmeric Curcumin 500 MG CAPS, Take 500 mg by mouth daily., Disp: , Rfl:   EXAM:  VITALS per patient if applicable:  [id number]  GENERAL: alert, oriented, appears well and in no acute distress  HEENT: atraumatic, conjunttiva clear, no obvious abnormalities on inspection of external nose and ears  NECK: normal movements of the head and neck  LUNGS: on inspection no signs of respiratory distress, breathing rate appears normal, no obvious gross SOB, gasping or wheezing  CV: no obvious cyanosis  PSYCH/NEURO: pleasant and cooperative, no obvious depression or anxiety, speech and thought processing grossly intact  ASSESSMENT AND PLAN:  Discussed the following assessment and plan:  Constipation No significant constipation.  Bowels not as regular - has noticed occasionally smaller and harder.  Stay hydrated.  Miralax.  Follow.    Environmental allergies Stable.   GERD (gastroesophageal reflux disease) Has been taking omeprazole.  Discussed last visit adding pepcid.  Follow.    Hypertension Blood pressure under good control.  Continue same medication regimen.  Follow pressures.  Follow metabolic panel.    Neck pain Persistent neck pain.  MRI as outlined previously.  Saw Dr Lacinda Axon.  Recommended injection/PT. Pt request [facility].  Follow.   Primary hyperparathyroidism ([place]) Has been followed by endocrinology previously.  Follow calcium.    Renal artery stenosis ([place]) Has previously been followed by [facility].  Follow renal function.   Rheumatoid arthritis  ([place]) Followed by rheumatology.   Family history of colonic polyps Colonoscopy 05/2016 - recommended f/u in 5 years.    Orders Placed This Encounter  Procedures  . Ambulatory referral to Physical Therapy    Referral Priority:   Routine    Referral Type:   Physical Medicine    Referral Reason:   Specialty Services Required    Requested Specialty:   Physical Therapy    Number of Visits Requested:   1    Meds ordered this encounter  Medications  . cyclobenzaprine (  FLEXERIL) 5 MG tablet    Sig: TAKE 1 TABLET BY MOUTH AT BEDTIME AS NEEDED FOR MUSCLE SPASMS    Dispense:  30 tablet    Refill:  0  . dicyclomine (BENTYL) 10 MG capsule    Sig: TAKE 1 CAPSULE BY MOUTH ONCE DAILY AS NEEDED AS DIRECTED BY PHYSICIAN    Dispense:  90 capsule    Refill:  1     I discussed the assessment and treatment plan with the patient. The patient was provided an opportunity to ask questions and all were answered. The patient agreed with the plan and demonstrated an understanding of the instructions.   The patient was advised to call back or seek an in-person evaluation if the symptoms worsen or if the condition fails to improve as anticipated.   Einar Pheasant, MD

## 2019-06-27 ENCOUNTER — Other Ambulatory Visit: Payer: Medicare Other

## 2019-06-28 ENCOUNTER — Other Ambulatory Visit: Payer: Medicare Other

## 2019-06-30 ENCOUNTER — Encounter: Payer: Self-pay | Admitting: Internal Medicine

## 2019-06-30 NOTE — Assessment & Plan Note (Signed)
Stable

## 2019-06-30 NOTE — Assessment & Plan Note (Signed)
Followed by rheumatology. 

## 2019-06-30 NOTE — Assessment & Plan Note (Signed)
Persistent neck pain.  MRI as outlined previously.  Saw Dr Lacinda Axon.  Recommended injection/PT. Pt request [facility].  Follow.

## 2019-06-30 NOTE — Assessment & Plan Note (Signed)
Has been taking omeprazole.  Discussed last visit adding pepcid.  Follow.

## 2019-06-30 NOTE — Assessment & Plan Note (Signed)
Colonoscopy 05/2016 - recommended f/u in 5 years.   

## 2019-06-30 NOTE — Assessment & Plan Note (Signed)
No significant constipation.  Bowels not as regular - has noticed occasionally smaller and harder.  Stay hydrated.  Miralax.  Follow.

## 2019-06-30 NOTE — Assessment & Plan Note (Signed)
Has been followed by endocrinology previously.  Follow calcium.

## 2019-06-30 NOTE — Assessment & Plan Note (Signed)
Blood pressure under good control.  Continue same medication regimen.  Follow pressures.  Follow metabolic panel.   

## 2019-06-30 NOTE — Assessment & Plan Note (Signed)
Has previously been followed by [facility].  Follow renal function.

## 2019-07-01 ENCOUNTER — Other Ambulatory Visit: Payer: Self-pay

## 2019-07-01 ENCOUNTER — Other Ambulatory Visit (INDEPENDENT_AMBULATORY_CARE_PROVIDER_SITE_OTHER): Payer: Medicare Other

## 2019-07-01 DIAGNOSIS — M069 Rheumatoid arthritis, unspecified: Secondary | ICD-10-CM | POA: Diagnosis not present

## 2019-07-01 DIAGNOSIS — I10 Essential (primary) hypertension: Secondary | ICD-10-CM

## 2019-07-01 LAB — CBC WITH DIFFERENTIAL/PLATELET
Basophils Absolute: 0 10*3/uL (ref 0.0–0.1)
Basophils Relative: 0.9 % (ref 0.0–3.0)
Eosinophils Absolute: 0.2 10*3/uL (ref 0.0–0.7)
Eosinophils Relative: 5.6 % — ABNORMAL HIGH (ref 0.0–5.0)
HCT: 38.9 % (ref 36.0–46.0)
Hemoglobin: 13.3 g/dL (ref 12.0–15.0)
Lymphocytes Relative: 33.2 % (ref 12.0–46.0)
Lymphs Abs: 1 10*3/uL (ref 0.7–4.0)
MCHC: 34.2 g/dL (ref 30.0–36.0)
MCV: 95.4 fl (ref 78.0–100.0)
Monocytes Absolute: 0.2 10*3/uL (ref 0.1–1.0)
Monocytes Relative: 6.9 % (ref 3.0–12.0)
Neutro Abs: 1.6 10*3/uL (ref 1.4–7.7)
Neutrophils Relative %: 53.4 % (ref 43.0–77.0)
Platelets: 232 10*3/uL (ref 150.0–400.0)
RBC: 4.08 Mil/uL (ref 3.87–5.11)
RDW: 12.6 % (ref 11.5–15.5)
WBC: 3.1 10*3/uL — ABNORMAL LOW (ref 4.0–10.5)

## 2019-07-01 LAB — HEPATIC FUNCTION PANEL
ALT: 21 U/L (ref 0–35)
AST: 19 U/L (ref 0–37)
Albumin: 4.7 g/dL (ref 3.5–5.2)
Alkaline Phosphatase: 57 U/L (ref 39–117)
Bilirubin, Direct: 0.1 mg/dL (ref 0.0–0.3)
Total Bilirubin: 0.5 mg/dL (ref 0.2–1.2)
Total Protein: 6.8 g/dL (ref 6.0–8.3)

## 2019-07-01 LAB — LIPID PANEL
Cholesterol: 172 mg/dL (ref 0–200)
HDL: 56 mg/dL (ref >39.00)
LDL Cholesterol: 98 mg/dL (ref 0–99)
NonHDL: 116.46
Total CHOL/HDL Ratio: 3
Triglycerides: 93 mg/dL (ref 0.0–149.0)
VLDL: 18.6 mg/dL (ref 0.0–40.0)

## 2019-07-01 LAB — BASIC METABOLIC PANEL
BUN: 14 mg/dL (ref 6–23)
CO2: 30 mEq/L (ref 19–32)
Calcium: 9.7 mg/dL (ref 8.4–10.5)
Chloride: 103 mEq/L (ref 96–112)
Creatinine, Ser: 0.81 mg/dL (ref 0.40–1.20)
GFR: 70.08 mL/min (ref >60.00)
Glucose, Bld: 89 mg/dL (ref 70–99)
Potassium: 4.2 mEq/L (ref 3.5–5.1)
Sodium: 139 mEq/L (ref 135–145)

## 2019-07-02 ENCOUNTER — Other Ambulatory Visit: Payer: Self-pay

## 2019-07-02 DIAGNOSIS — F419 Anxiety disorder, unspecified: Secondary | ICD-10-CM | POA: Diagnosis not present

## 2019-07-02 MED ORDER — ROSUVASTATIN CALCIUM 5 MG PO TABS: 5.0000 mg | ORAL_TABLET | ORAL | 0 refills | Status: DC

## 2019-07-03 DIAGNOSIS — Z23 Encounter for immunization: Secondary | ICD-10-CM | POA: Diagnosis not present

## 2019-07-09 DIAGNOSIS — M542 Cervicalgia: Secondary | ICD-10-CM | POA: Diagnosis not present

## 2019-07-09 DIAGNOSIS — M25519 Pain in unspecified shoulder: Secondary | ICD-10-CM | POA: Diagnosis not present

## 2019-07-12 DIAGNOSIS — M25519 Pain in unspecified shoulder: Secondary | ICD-10-CM | POA: Diagnosis not present

## 2019-07-12 DIAGNOSIS — M542 Cervicalgia: Secondary | ICD-10-CM | POA: Diagnosis not present

## 2019-07-15 DIAGNOSIS — M25519 Pain in unspecified shoulder: Secondary | ICD-10-CM | POA: Diagnosis not present

## 2019-07-15 DIAGNOSIS — M542 Cervicalgia: Secondary | ICD-10-CM | POA: Diagnosis not present

## 2019-07-16 DIAGNOSIS — F419 Anxiety disorder, unspecified: Secondary | ICD-10-CM | POA: Diagnosis not present

## 2019-07-19 ENCOUNTER — Telehealth: Payer: Self-pay | Admitting: *Deleted

## 2019-07-19 DIAGNOSIS — D72819 Decreased white blood cell count, unspecified: Secondary | ICD-10-CM

## 2019-07-19 NOTE — Telephone Encounter (Signed)
Please place future orders for lab appt.  

## 2019-07-21 NOTE — Telephone Encounter (Signed)
Order placed for f/u [facility].   

## 2019-07-22 ENCOUNTER — Other Ambulatory Visit: Payer: Self-pay

## 2019-07-22 ENCOUNTER — Other Ambulatory Visit (INDEPENDENT_AMBULATORY_CARE_PROVIDER_SITE_OTHER): Payer: Medicare Other

## 2019-07-22 DIAGNOSIS — D72819 Decreased white blood cell count, unspecified: Secondary | ICD-10-CM

## 2019-07-22 LAB — CBC WITH DIFFERENTIAL/PLATELET
Basophils Absolute: 0 10*3/uL (ref 0.0–0.1)
Basophils Relative: 0.7 % (ref 0.0–3.0)
Eosinophils Absolute: 0.1 10*3/uL (ref 0.0–0.7)
Eosinophils Relative: 3.7 % (ref 0.0–5.0)
HCT: 36.2 % (ref 36.0–46.0)
Hemoglobin: 12.5 g/dL (ref 12.0–15.0)
Lymphocytes Relative: 26.4 % (ref 12.0–46.0)
Lymphs Abs: 1 10*3/uL (ref 0.7–4.0)
MCHC: 34.5 g/dL (ref 30.0–36.0)
MCV: 94.9 fl (ref 78.0–100.0)
Monocytes Absolute: 0.2 10*3/uL (ref 0.1–1.0)
Monocytes Relative: 5.3 % (ref 3.0–12.0)
Neutro Abs: 2.4 10*3/uL (ref 1.4–7.7)
Neutrophils Relative %: 63.9 % (ref 43.0–77.0)
Platelets: 226 10*3/uL (ref 150.0–400.0)
RBC: 3.82 Mil/uL — ABNORMAL LOW (ref 3.87–5.11)
RDW: 13.5 % (ref 11.5–15.5)
WBC: 3.7 10*3/uL — ABNORMAL LOW (ref 4.0–10.5)

## 2019-07-23 ENCOUNTER — Other Ambulatory Visit: Payer: Self-pay | Admitting: Internal Medicine

## 2019-07-23 DIAGNOSIS — M542 Cervicalgia: Secondary | ICD-10-CM | POA: Diagnosis not present

## 2019-07-23 DIAGNOSIS — M25519 Pain in unspecified shoulder: Secondary | ICD-10-CM | POA: Diagnosis not present

## 2019-07-23 DIAGNOSIS — D72819 Decreased white blood cell count, unspecified: Secondary | ICD-10-CM

## 2019-07-23 NOTE — Progress Notes (Signed)
Order placed for f/u cbc.   

## 2019-07-29 DIAGNOSIS — F419 Anxiety disorder, unspecified: Secondary | ICD-10-CM | POA: Diagnosis not present

## 2019-07-31 ENCOUNTER — Other Ambulatory Visit: Payer: Self-pay | Admitting: Internal Medicine

## 2019-07-31 DIAGNOSIS — H5213 Myopia, bilateral: Secondary | ICD-10-CM | POA: Diagnosis not present

## 2019-07-31 DIAGNOSIS — H2512 Age-related nuclear cataract, left eye: Secondary | ICD-10-CM | POA: Diagnosis not present

## 2019-07-31 DIAGNOSIS — H52222 Regular astigmatism, left eye: Secondary | ICD-10-CM | POA: Diagnosis not present

## 2019-07-31 DIAGNOSIS — H2511 Age-related nuclear cataract, right eye: Secondary | ICD-10-CM | POA: Diagnosis not present

## 2019-07-31 DIAGNOSIS — Q142 Congenital malformation of optic disc: Secondary | ICD-10-CM | POA: Diagnosis not present

## 2019-08-01 DIAGNOSIS — M542 Cervicalgia: Secondary | ICD-10-CM | POA: Diagnosis not present

## 2019-08-01 DIAGNOSIS — M25519 Pain in unspecified shoulder: Secondary | ICD-10-CM | POA: Diagnosis not present

## 2019-08-07 DIAGNOSIS — M542 Cervicalgia: Secondary | ICD-10-CM | POA: Diagnosis not present

## 2019-08-07 DIAGNOSIS — M25519 Pain in unspecified shoulder: Secondary | ICD-10-CM | POA: Diagnosis not present

## 2019-08-13 ENCOUNTER — Other Ambulatory Visit: Payer: Self-pay

## 2019-08-13 ENCOUNTER — Other Ambulatory Visit (INDEPENDENT_AMBULATORY_CARE_PROVIDER_SITE_OTHER): Payer: Medicare Other

## 2019-08-13 DIAGNOSIS — D72819 Decreased white blood cell count, unspecified: Secondary | ICD-10-CM | POA: Diagnosis not present

## 2019-08-13 LAB — CBC WITH DIFFERENTIAL/PLATELET
Basophils Absolute: 0 10*3/uL (ref 0.0–0.1)
Basophils Relative: 0.6 % (ref 0.0–3.0)
Eosinophils Absolute: 0.1 10*3/uL (ref 0.0–0.7)
Eosinophils Relative: 2.6 % (ref 0.0–5.0)
HCT: 34.3 % — ABNORMAL LOW (ref 36.0–46.0)
Hemoglobin: 11.9 g/dL — ABNORMAL LOW (ref 12.0–15.0)
Lymphocytes Relative: 23.1 % (ref 12.0–46.0)
Lymphs Abs: 1.2 10*3/uL (ref 0.7–4.0)
MCHC: 34.7 g/dL (ref 30.0–36.0)
MCV: 95.8 fl (ref 78.0–100.0)
Monocytes Absolute: 0.2 10*3/uL (ref 0.1–1.0)
Monocytes Relative: 4.6 % (ref 3.0–12.0)
Neutro Abs: 3.6 10*3/uL (ref 1.4–7.7)
Neutrophils Relative %: 69.1 % (ref 43.0–77.0)
Platelets: 222 10*3/uL (ref 150.0–400.0)
RBC: 3.58 Mil/uL — ABNORMAL LOW (ref 3.87–5.11)
RDW: 13.5 % (ref 11.5–15.5)
WBC: 5.2 10*3/uL (ref 4.0–10.5)

## 2019-08-14 ENCOUNTER — Other Ambulatory Visit: Payer: Self-pay | Admitting: Internal Medicine

## 2019-08-14 ENCOUNTER — Other Ambulatory Visit: Payer: Self-pay

## 2019-08-14 ENCOUNTER — Telehealth: Payer: Self-pay

## 2019-08-14 ENCOUNTER — Other Ambulatory Visit (INDEPENDENT_AMBULATORY_CARE_PROVIDER_SITE_OTHER): Payer: Medicare Other

## 2019-08-14 DIAGNOSIS — I10 Essential (primary) hypertension: Secondary | ICD-10-CM

## 2019-08-14 DIAGNOSIS — D649 Anemia, unspecified: Secondary | ICD-10-CM

## 2019-08-14 NOTE — Telephone Encounter (Signed)
Future lab order placed for hepatic function

## 2019-08-14 NOTE — Progress Notes (Signed)
Order placed for f/u labs.  

## 2019-08-15 LAB — HEPATIC FUNCTION PANEL
ALT: 28 U/L (ref 0–35)
AST: 23 U/L (ref 0–37)
Albumin: 4.7 g/dL (ref 3.5–5.2)
Alkaline Phosphatase: 61 U/L (ref 39–117)
Bilirubin, Direct: 0.1 mg/dL (ref 0.0–0.3)
Total Bilirubin: 0.5 mg/dL (ref 0.2–1.2)
Total Protein: 6.5 g/dL (ref 6.0–8.3)

## 2019-08-16 ENCOUNTER — Encounter: Payer: Self-pay | Admitting: Internal Medicine

## 2019-08-30 ENCOUNTER — Other Ambulatory Visit: Payer: Self-pay | Admitting: Internal Medicine

## 2019-09-05 ENCOUNTER — Other Ambulatory Visit: Payer: Self-pay | Admitting: Internal Medicine

## 2019-09-05 DIAGNOSIS — Z1231 Encounter for screening mammogram for malignant neoplasm of breast: Secondary | ICD-10-CM

## 2019-09-09 DIAGNOSIS — H2512 Age-related nuclear cataract, left eye: Secondary | ICD-10-CM | POA: Diagnosis not present

## 2019-09-09 DIAGNOSIS — Z01818 Encounter for other preprocedural examination: Secondary | ICD-10-CM | POA: Diagnosis not present

## 2019-09-09 DIAGNOSIS — H52222 Regular astigmatism, left eye: Secondary | ICD-10-CM | POA: Diagnosis not present

## 2019-09-11 ENCOUNTER — Other Ambulatory Visit: Payer: Self-pay | Admitting: Internal Medicine

## 2019-09-23 DIAGNOSIS — H2512 Age-related nuclear cataract, left eye: Secondary | ICD-10-CM | POA: Diagnosis not present

## 2019-09-23 DIAGNOSIS — H2511 Age-related nuclear cataract, right eye: Secondary | ICD-10-CM | POA: Diagnosis not present

## 2019-09-24 ENCOUNTER — Other Ambulatory Visit: Payer: Medicare Other

## 2019-09-26 ENCOUNTER — Other Ambulatory Visit: Payer: Self-pay

## 2019-09-26 ENCOUNTER — Other Ambulatory Visit (INDEPENDENT_AMBULATORY_CARE_PROVIDER_SITE_OTHER): Payer: Medicare Other

## 2019-09-26 DIAGNOSIS — D649 Anemia, unspecified: Secondary | ICD-10-CM

## 2019-09-26 LAB — IBC + FERRITIN
Ferritin: 32.5 ng/mL (ref 10.0–291.0)
Iron: 89 ug/dL (ref 42–145)
Saturation Ratios: 19.8 % — ABNORMAL LOW (ref 20.0–50.0)
Transferrin: 321 mg/dL (ref 212.0–360.0)

## 2019-09-26 LAB — HEMOGLOBIN: Hemoglobin: 12.6 g/dL (ref 12.0–15.0)

## 2019-09-27 ENCOUNTER — Encounter: Payer: Self-pay | Admitting: Internal Medicine

## 2019-10-09 ENCOUNTER — Ambulatory Visit
Admission: RE | Admit: 2019-10-09 | Discharge: 2019-10-09 | Disposition: A | Discharge status: Home / Self Care | Payer: Medicare Other | Source: Ambulatory Visit | Attending: Internal Medicine | Admitting: Internal Medicine

## 2019-10-09 DIAGNOSIS — Z1231 Encounter for screening mammogram for malignant neoplasm of breast: Secondary | ICD-10-CM | POA: Diagnosis not present

## 2019-10-25 ENCOUNTER — Other Ambulatory Visit: Payer: Self-pay

## 2019-10-25 ENCOUNTER — Ambulatory Visit (INDEPENDENT_AMBULATORY_CARE_PROVIDER_SITE_OTHER): Payer: Medicare Other

## 2019-10-25 VITALS — Ht 65.75 in | Wt 142.0 lb

## 2019-10-25 DIAGNOSIS — Z0000 Encounter for general adult medical examination without abnormal findings: Secondary | ICD-10-CM

## 2019-10-25 NOTE — Patient Instructions (Addendum)
Victoria Moss , Thank you for taking time to come for your Medicare Wellness Visit. I appreciate your ongoing commitment to your health goals. Please review the following plan we discussed and let me know if I can assist you in the future.   These are the goals we discussed: Goals      Patient Stated   .  Increase physical activity (pt-stated)      I would like to start chair yoga again       This is a list of the screening recommended for you and due dates:  Health Maintenance  Topic Date Due  . Flu Shot  12/08/2019  . Mammogram  10/08/2020  . Tetanus Vaccine  08/18/2024  . Colon Cancer Screening  05/24/2026  . DEXA scan (bone density measurement)  Completed  . COVID-19 Vaccine  Completed  .  Hepatitis C: One time screening is recommended by Center for Disease Control  (CDC) for  adults born from [birth year] through 1965.   Completed  . Pneumonia vaccines  Completed    Immunization History  Administered Date(s) Administered  . Fluad Quad(high Dose 65+) 01/04/2019  . Influenza, High Dose Seasonal PF 02/10/2016, 01/26/2017, 02/06/2018  . Influenza,inj,Quad PF,6+ Mos 02/04/2013, 02/01/2014, 02/06/2015  . Influenza,inj,quad, With Preservative 08/07/2016  . Pneumococcal Conjugate-13 08/14/2015, 10/20/2016  . Pneumococcal Polysaccharide-23 10/20/2016  . Tdap 08/19/2014  . Zoster 05/11/2011  . Zoster Recombinat (Shingrix) 12/28/2017, 04/12/2018   Screening Tests Health Maintenance  Topic Date Due  . INFLUENZA VACCINE  12/08/2019  . MAMMOGRAM  10/08/2020  . TETANUS/TDAP  08/18/2024  . COLONOSCOPY  05/24/2026  . DEXA SCAN  Completed  . COVID-19 Vaccine  Completed  . Hepatitis C Screening  Completed  . PNA vac Low Risk Adult  Completed   Advanced directives: declined  Conditions/risks identified: none  Next appointment: Follow up in one year for your annual wellness visit.   Preventive Care 100 Years and Older, Female Preventive care refers to lifestyle choices and visits  with your health care provider that can promote health and wellness. What does preventive care include?  A yearly physical exam. This is also called an annual well check.  Dental exams once or twice a year.  Routine eye exams. Ask your health care provider how often you should have your eyes checked.  Personal lifestyle choices, including:  Daily care of your teeth and gums.  Regular physical activity.  Eating a healthy diet.  Avoiding tobacco and drug use.  Limiting alcohol use.  Practicing safe sex.  Taking low-dose aspirin every day.  Taking vitamin and mineral supplements as recommended by your health care provider. What happens during an annual well check? The services and screenings done by your health care provider during your annual well check will depend on your age, overall health, lifestyle risk factors, and family history of disease. Counseling  Your health care provider may ask you questions about your:  Alcohol use.  Tobacco use.  Drug use.  Emotional well-being.  Home and relationship well-being.  Sexual activity.  Eating habits.  History of falls.  Memory and ability to understand (cognition).  Work and work Statistician.  Reproductive health. Screening  You may have the following tests or measurements:  Height, weight, and BMI.  Blood pressure.  Lipid and cholesterol levels. These may be checked every 5 years, or more frequently if you are over 40 years old.  Skin check.  Lung cancer screening. You may have this screening every year starting at  age 27 if you have a 30-pack-year history of smoking and currently smoke or have quit within the past 15 years.  Fecal occult blood test (FOBT) of the stool. You may have this test every year starting at age 49.  Flexible sigmoidoscopy or colonoscopy. You may have a sigmoidoscopy every 5 years or a colonoscopy every 10 years starting at age 40.  Hepatitis C blood test.  Hepatitis B blood  test.  Sexually transmitted disease (STD) testing.  Diabetes screening. This is done by checking your blood sugar (glucose) after you have not eaten for a while (fasting). You may have this done every 1-3 years.  Bone density scan. This is done to screen for osteoporosis. You may have this done starting at age 22.  Mammogram. This may be done every 1-2 years. Talk to your health care provider about how often you should have regular mammograms. Talk with your health care provider about your test results, treatment options, and if necessary, the need for more tests. Vaccines  Your health care provider may recommend certain vaccines, such as:  Influenza vaccine. This is recommended every year.  Tetanus, diphtheria, and acellular pertussis (Tdap, Td) vaccine. You may need a Td booster every 10 years.  Zoster vaccine. You may need this after age 7.  Pneumococcal 13-valent conjugate (PCV13) vaccine. One dose is recommended after age 75.  Pneumococcal polysaccharide (PPSV23) vaccine. One dose is recommended after age 99. Talk to your health care provider about which screenings and vaccines you need and how often you need them. This information is not intended to replace advice given to you by your health care provider. Make sure you discuss any questions you have with your health care provider. Document Released: 05/22/2015 Document Revised: 01/13/2016 Document Reviewed: 02/24/2015 Elsevier Interactive Patient Education  2017 [place] Prevention in the Home Falls can cause injuries. They can happen to people of all ages. There are many things you can do to make your home safe and to help prevent falls. What can I do on the outside of my home?  Regularly fix the edges of walkways and driveways and fix any cracks.  Remove anything that might make you trip as you walk through a door, such as a raised step or threshold.  Trim any bushes or trees on the path to your home.  Use  bright outdoor lighting.  Clear any walking paths of anything that might make someone trip, such as rocks or tools.  Regularly check to see if handrails are loose or broken. Make sure that both sides of any steps have handrails.  Any raised decks and porches should have guardrails on the edges.  Have any leaves, snow, or ice cleared regularly.  Use sand or salt on walking paths during winter.  Clean up any spills in your garage right away. This includes oil or grease spills. What can I do in the bathroom?  Use night lights.  Install grab bars by the toilet and in the tub and shower. Do not use towel bars as grab bars.  Use non-skid mats or decals in the tub or shower.  If you need to sit down in the shower, use a plastic, non-slip stool.  Keep the floor dry. Clean up any water that spills on the floor as soon as it happens.  Remove soap buildup in the tub or shower regularly.  Attach bath mats securely with double-sided non-slip rug tape.  Do not have throw rugs and other things on  the floor that can make you trip. What can I do in the bedroom?  Use night lights.  Make sure that you have a light by your bed that is easy to reach.  Do not use any sheets or blankets that are too big for your bed. They should not hang down onto the floor.  Have a firm chair that has side arms. You can use this for support while you get dressed.  Do not have throw rugs and other things on the floor that can make you trip. What can I do in the kitchen?  Clean up any spills right away.  Avoid walking on wet floors.  Keep items that you use a lot in easy-to-reach places.  If you need to reach something above you, use a strong step stool that has a grab bar.  Keep electrical cords out of the way.  Do not use floor polish or wax that makes floors slippery. If you must use wax, use non-skid floor wax.  Do not have throw rugs and other things on the floor that can make you trip. What can  I do with my stairs?  Do not leave any items on the stairs.  Make sure that there are handrails on both sides of the stairs and use them. Fix handrails that are broken or loose. Make sure that handrails are as long as the stairways.  Check any carpeting to make sure that it is firmly attached to the stairs. Fix any carpet that is loose or worn.  Avoid having throw rugs at the top or bottom of the stairs. If you do have throw rugs, attach them to the floor with carpet tape.  Make sure that you have a light switch at the top of the stairs and the bottom of the stairs. If you do not have them, ask someone to add them for you. What else can I do to help prevent falls?  Wear shoes that:  Do not have high heels.  Have rubber bottoms.  Are comfortable and fit you well.  Are closed at the toe. Do not wear sandals.  If you use a stepladder:  Make sure that it is fully opened. Do not climb a closed stepladder.  Make sure that both sides of the stepladder are locked into place.  Ask someone to hold it for you, if possible.  Clearly mark and make sure that you can see:  Any grab bars or handrails.  First and last steps.  Where the edge of each step is.  Use tools that help you move around (mobility aids) if they are needed. These include:  Canes.  Walkers.  Scooters.  Crutches.  Turn on the lights when you go into a dark area. Replace any light bulbs as soon as they burn out.  Set up your furniture so you have a clear path. Avoid moving your furniture around.  If any of your floors are uneven, fix them.  If there are any pets around you, be aware of where they are.  Review your medicines with your doctor. Some medicines can make you feel dizzy. This can increase your chance of falling. Ask your doctor what other things that you can do to help prevent falls. This information is not intended to replace advice given to you by your health care provider. Make sure you  discuss any questions you have with your health care provider. Document Released: 02/19/2009 Document Revised: 10/01/2015 Document Reviewed: 05/30/2014 Elsevier Interactive Patient Education  2017 [place].

## 2019-10-25 NOTE — Progress Notes (Addendum)
Subjective:   Dynasty Holquin is a 69 y.o. female who presents for Medicare Annual (Subsequent) preventive examination.  Review of Systems:  No ROS.  Medicare Wellness Virtual Visit.   Cardiac Risk Factors include: advanced age (>13men, >59 women);hypertension     Objective:     Vitals: Ht 5' 5.75" (1.67 m)   Wt 142 lb (64.4 kg)   LMP 05/09/2006 (Approximate)   BMI 23.09 kg/m   Body mass index is 23.09 kg/m.  Advanced Directives 10/25/2019 10/20/2016 03/21/2016  Does Patient Have a Medical Advance Directive? No No No  Would patient like information on creating a medical advance directive? No - Patient declined Yes (MAU/Ambulatory/Procedural Areas - Information given) -    Tobacco Social History   Tobacco Use  Smoking Status Former Smoker  . Years: 7.00  . Quit date: 05/09/1973  . Years since quitting: 46.4  Smokeless Tobacco Never Used  Tobacco Comment   Quit at age 12     Counseling given: Not Answered Comment: Quit at age 20   Clinical Intake:  Pre-visit preparation completed: Yes        Diabetes: No  How often do you need to have someone help you when you read instructions, pamphlets, or other written materials from your doctor or pharmacy?: 1 - Never  Interpreter Needed?: No     Past Medical History:  Diagnosis Date  . Allergy   . Arthritis   . Basal cell cancer   . Diverticulosis    H/O  . GERD (gastroesophageal reflux disease)   . Heart murmur   . Hypertension   . IBS (irritable bowel syndrome)   . Migraine    H/O, none since stopping chocolate  . Osteoporosis   . Rheumatic fever    H/O  . Status post dilation of esophageal narrowing    Past Surgical History:  Procedure Laterality Date  . APPENDECTOMY  1982  . BREAST BIOPSY Right 05/16   axillary node- neg  . CESAREAN SECTION    . COLONOSCOPY  2018  . DILATION AND CURETTAGE OF UTERUS     x2  . DILATION AND CURETTAGE, DIAGNOSTIC / THERAPEUTIC     x 2  . fatty tumor      on back x2  . HERNIA REPAIR    . parathyroid gland one removed    . PARATHYROIDECTOMY    . SKIN CANCER EXCISION     [facility]   . TOE SURGERY Left    joint replacement bit toe  . TUBAL LIGATION    . UMBILICAL HERNIA REPAIR    . UPPER GI ENDOSCOPY     Family History  Problem Relation Age of Onset  . Breast cancer Mother 8  . Hypertension Mother   . Stroke Mother        Late 50  . Arthritis Mother   . Colon polyps Mother   . Cancer Mother        Breast  . Hypertension Father   . Hyperlipidemia Father   . Dementia Father   . Diabetes Neg Hx   . Colon cancer Neg Hx   . Esophageal cancer Neg Hx   . Kidney disease Neg Hx   . Gallbladder disease Neg Hx    Social History   Socioeconomic History  . Marital status: Widowed    Spouse name: Not on file  . Number of children: 2  . Years of education: Not on file  . Highest education level: Not on file  Occupational History  . Occupation: Retired    Fish farm manager: OTHER  Tobacco Use  . Smoking status: Former Smoker    Years: 7.00    Quit date: 05/09/1973    Years since quitting: 46.4  . Smokeless tobacco: Never Used  . Tobacco comment: Quit at age 48  Vaping Use  . Vaping Use: Never used  Substance and Sexual Activity  . Alcohol use: No    Alcohol/week: 0.0 standard drinks  . Drug use: No  . Sexual activity: Not Currently  Other Topics Concern  . Not on file  Social History Narrative  . Not on file   Social Determinants of Health   Financial Resource Strain:   . Difficulty of Paying Living Expenses:   Food Insecurity:   . Worried About Charity fundraiser in the Last Year:   . Arboriculturist in the Last Year:   Transportation Needs:   . Film/video editor (Medical):   Marland Kitchen Lack of Transportation (Non-Medical):   Physical Activity:   . Days of Exercise per Week:   . Minutes of Exercise per Session:   Stress:   . Feeling of Stress :   Social Connections:   . Frequency of Communication with Friends and Family:   .  Frequency of Social Gatherings with Friends and Family:   . Attends Religious Services:   . Active Member of Clubs or Organizations:   . Attends Archivist Meetings:   Marland Kitchen Marital Status:     Outpatient Encounter Medications as of 10/25/2019  Medication Sig  . amLODipine (NORVASC) 5 MG tablet TAKE ONE TABLET EVERY DAY  . CALCIUM CARB-ERGOCALCIFEROL PO Take 1 tablet by mouth daily.   . cyclobenzaprine (FLEXERIL) 5 MG tablet TAKE 1 TABLET BY MOUTH AT BEDTIME AS NEEDED FOR MUSCLE SPASMS  . dicyclomine (BENTYL) 10 MG capsule TAKE 1 CAPSULE BY MOUTH ONCE DAILY AS NEEDED AS DIRECTED BY PHYSICIAN  . fluticasone (FLONASE) 50 MCG/ACT nasal spray Place 1 spray into both nostrils daily as needed for allergies or rhinitis.  Marland Kitchen losartan (COZAAR) 100 MG tablet TAKE ONE TABLET BY MOUTH EVERY DAY  . methotrexate (RHEUMATREX) 2.5 MG tablet Take 6  tabs (15 mg ) once a week, 12  weeks, 1 refill  . Multiple Vitamin (MULTI-VITAMINS) TABS Take 1 tablet by mouth daily.   . Omega-3 Fatty Acids (FISH OIL) 1000 MG CAPS Take 1,000 mg by mouth daily.   Marland Kitchen omeprazole (PRILOSEC) 20 MG capsule Take 20 mg by mouth daily.   . phenazopyridine (PYRIDIUM) 200 MG tablet Take 1 tablet (200 mg total) by mouth 3 (three) times daily as needed for pain.  . rosuvastatin (CRESTOR) 5 MG tablet TAKE ONE TABLET 3 TIMES A WEEK  . Turmeric Curcumin 500 MG CAPS Take 500 mg by mouth daily.   No facility-administered encounter medications on file as of 10/25/2019.    Activities of Daily Living In your present state of health, do you have any difficulty performing the following activities: 10/25/2019  Hearing? N  Vision? N  Difficulty concentrating or making decisions? N  Walking or climbing stairs? N  Dressing or bathing? N  Doing errands, shopping? N  Preparing Food and eating ? N  Using the Toilet? N  In the past six months, have you accidently leaked urine? N  Do you have problems with loss of bowel control? N  Managing  your Medications? N  Managing your Finances? N  Housekeeping or managing your Housekeeping? N  Some recent data might be hidden    Patient Care Team: Einar Pheasant, MD as PCP - General (Internal Medicine) Einar Pheasant, MD (Internal Medicine) Bary Castilla Forest Gleason, MD (General Surgery)    Assessment:   This is a routine wellness examination for Mikayla.  I connected with Anjalina today by telephone and verified that I am speaking with the correct person using two identifiers. Location patient: home Location provider: work Persons participating in the virtual visit: patient, Marine scientist.    I discussed the limitations, risks, security and privacy concerns of performing an evaluation and management service by telephone and the availability of in person appointments. I also discussed with the patient that there may be a patient responsible charge related to this service. The patient expressed understanding and verbally consented to this telephonic visit.    Interactive audio and video telecommunications were attempted between this provider and patient, however failed, due to patient having technical difficulties OR patient did not have access to video capability.  We continued and completed visit with audio only.  Some vital signs may be absent or patient reported.   Health Maintenance Due: See completed HM at the end of note.   Eye: Visual acuity not assessed. Virtual visit. Followed by their ophthalmologist. Cataract extracted, bilateral. Local physician is [facility].  Dental: Visits every 12 months.    Hearing: Demonstrates normal hearing during visit.  Safety:  Patient feels safe at home- yes Patient does have smoke detectors at home- yes Patient does wear sunscreen or protective clothing when in direct sunlight - yes Patient does wear seat belt when in a moving vehicle - yes Patient drives- yes Adequate lighting in walkways free from debris- yes Grab bars and handrails  used as appropriate- yes Ambulates with an assistive device- no  Medication: Taking as directed and without issues.  Pill box in use -yes  Self managed - yes   Covid-19: Precautions and sickness symptoms discussed. Wears mask, social distancing, hand hygiene as appropriate.   Activities of Daily Living Patient denies needing assistance with: household chores, feeding themselves, getting from bed to chair, getting to the toilet, bathing/showering, dressing, managing money, or preparing meals.   Discussed the importance of a healthy diet, water intake and the benefits of aerobic exercise.   Physical activity- walking daily 20-30 minutes, active outdoors in the yard, gardening. Fitbit steps 12,000-15,000 daily.   Diet:  Regular Water: good intake Caffeine: 1 cup of coffee  Other Providers Patient Care Team: Einar Pheasant, MD as PCP - General (Internal Medicine) Einar Pheasant, MD (Internal Medicine) Bary Castilla Forest Gleason, MD (General Surgery) Exercise Activities and Dietary recommendations Current Exercise Habits: Home exercise routine, Type of exercise: walking, Time (Minutes): 20, Frequency (Times/Week): 5, Weekly Exercise (Minutes/Week): 100, Intensity: Moderate  Goals      Patient Stated   .  Increase physical activity (pt-stated)      I would like to start chair yoga again       Fall Risk Fall Risk  10/25/2019 02/12/2019 07/03/2018 12/05/2017 10/20/2016  Falls in the past year? 0 0 0 No Yes  Number falls in past yr: 0 0 0 - -  Injury with Fall? - 0 - - Yes  Risk for fall due to : - - - - Other (Comment)  Risk for fall due to: Comment - - - - fall in yard   Follow up Falls evaluation completed - Falls evaluation completed - -   Is the patient's home free of loose  throw rugs in walkways, pet beds, electrical cords, etc?   Yes      Handrails on the stairs?  Yes      Adequate lighting?   Yes  Timed Get Up and Go performed: No, virtual visit  Depression Screen PHQ 2/9  Scores 10/25/2019 11/02/2018 12/06/2016 10/20/2016  PHQ - 2 Score 0 0 3 4  PHQ- 9 Score - 0 5 10     Cognitive Function Patient is alert and oriented x3. Patient denies difficulty focusing or concentrating. Patient likes to read for brain health.   MMSE - Mini Mental State Exam 10/20/2016  Orientation to time 5  Orientation to Place 5  Registration 3  Attention/ Calculation 5  Recall 3  Language- name 2 objects 2  Language- repeat 1  Language- follow 3 step command 3  Language- read & follow direction 1  Write a sentence 1  Copy design 1  Total score 30     6CIT Screen 10/25/2019  Months in reverse 0 points    Immunization History  Administered Date(s) Administered  . Fluad Quad(high Dose 65+) 01/04/2019  . Influenza, High Dose Seasonal PF 02/10/2016, 01/26/2017, 02/06/2018  . Influenza,inj,Quad PF,6+ Mos 02/04/2013, 02/01/2014, 02/06/2015  . Influenza,inj,quad, With Preservative 08/07/2016  . Moderna SARS-COVID-2 Vaccination 06/05/2019, 07/03/2019  . Pneumococcal Conjugate-13 08/14/2015, 10/20/2016  . Pneumococcal Polysaccharide-23 10/20/2016  . Tdap 08/19/2014  . Zoster 05/11/2011  . Zoster Recombinat (Shingrix) 12/28/2017, 04/12/2018   Screening Tests Health Maintenance  Topic Date Due  . INFLUENZA VACCINE  12/08/2019  . MAMMOGRAM  10/08/2020  . TETANUS/TDAP  08/18/2024  . COLONOSCOPY  05/24/2026  . DEXA SCAN  Completed  . COVID-19 Vaccine  Completed  . Hepatitis C Screening  Completed  . PNA vac Low Risk Adult  Completed    Cancer Screenings: Lung: Low Dose CT Chest recommended if Age 66-80 years, 30 pack-year currently smoking OR have quit w/in 15years. Patient does not qualify.     Plan:   Keep all routine maintenance appointments.   I have personally reviewed and noted the following in the patient's chart:   . Medical and social history . Use of alcohol, tobacco or illicit drugs  . Current medications and supplements . Functional ability and  status . Nutritional status . Physical activity . Advanced directives . List of other physicians . Hospitalizations, surgeries, and ER visits in previous 12 months . Vitals . Screenings to include cognitive, depression, and falls . Referrals and appointments  I have reviewed and discussed with patient certain preventive protocols, quality metrics, and best practice recommendations. A written personalized care plan for preventive services as well as general preventive health recommendations were provided to patient via mychart.     OBrien-Blaney, Kevia Zaucha L, LPN  [id number]    I have reviewed the above information and agree with above.   Deborra Medina, MD

## 2019-10-30 DIAGNOSIS — M0600 Rheumatoid arthritis without rheumatoid factor, unspecified site: Secondary | ICD-10-CM | POA: Diagnosis not present

## 2019-10-30 DIAGNOSIS — Z79899 Other long term (current) drug therapy: Secondary | ICD-10-CM | POA: Diagnosis not present

## 2019-11-15 DIAGNOSIS — H5789 Other specified disorders of eye and adnexa: Secondary | ICD-10-CM | POA: Diagnosis not present

## 2019-11-15 DIAGNOSIS — Z961 Presence of intraocular lens: Secondary | ICD-10-CM | POA: Diagnosis not present

## 2019-11-25 ENCOUNTER — Telehealth: Payer: Self-pay | Admitting: Internal Medicine

## 2019-11-25 NOTE — Telephone Encounter (Signed)
Placed in quick sign. 

## 2019-11-25 NOTE — Telephone Encounter (Signed)
Pt dropped off [facility] clearance form for [facility]. Placed envelope in colored folder up front.

## 2019-11-26 NOTE — Telephone Encounter (Signed)
Reviewed form.  Pt will be hiking, etc.  Confirm no new issues or concerns since her last visit with me.  I am ok to sign, just need to confirm staying active with no issues or acute problems.    Dr Nicki Reaper

## 2019-11-26 NOTE — Telephone Encounter (Signed)
Form signed and placed in box.   

## 2019-11-26 NOTE — Telephone Encounter (Signed)
Form placed up front for patient. Pt is aware

## 2019-11-26 NOTE — Telephone Encounter (Signed)
Patient stated she is active and no acute issues or concerns. She walks 30-40 minutes outside everyday.

## 2019-11-27 ENCOUNTER — Ambulatory Visit: Payer: Medicare Other | Admitting: Obstetrics and Gynecology

## 2019-12-05 ENCOUNTER — Ambulatory Visit (INDEPENDENT_AMBULATORY_CARE_PROVIDER_SITE_OTHER): Payer: Medicare Other | Admitting: Obstetrics and Gynecology

## 2019-12-05 ENCOUNTER — Encounter: Payer: Self-pay | Admitting: Obstetrics and Gynecology

## 2019-12-05 ENCOUNTER — Other Ambulatory Visit: Payer: Self-pay

## 2019-12-05 ENCOUNTER — Telehealth: Payer: Self-pay

## 2019-12-05 VITALS — BP 120/68 | HR 72 | Resp 14 | Ht 65.5 in | Wt 142.0 lb

## 2019-12-05 DIAGNOSIS — Z124 Encounter for screening for malignant neoplasm of cervix: Secondary | ICD-10-CM

## 2019-12-05 DIAGNOSIS — N6459 Other signs and symptoms in breast: Secondary | ICD-10-CM | POA: Diagnosis not present

## 2019-12-05 DIAGNOSIS — M810 Age-related osteoporosis without current pathological fracture: Secondary | ICD-10-CM | POA: Insufficient documentation

## 2019-12-05 DIAGNOSIS — Z01419 Encounter for gynecological examination (general) (routine) without abnormal findings: Secondary | ICD-10-CM | POA: Diagnosis not present

## 2019-12-05 NOTE — Telephone Encounter (Signed)
-----   Message from Salvadore Dom, MD sent at 12/05/2019 11:58 AM EDT ----- Please set her up for diagnostic imaging in her left breast (in [place], see epic). She just had a screening mammogram in 6/21, but she has just noticed new inversion of her left nipple.  Thanks, Sharee Pimple

## 2019-12-05 NOTE — Progress Notes (Signed)
69 y.o. G50P2012 Widowed White or Caucasian Not Hispanic or Latino female here for annual exam.    She had cataract surgery, trouble with her right eye, will need lasix on the right.   She has arthritis, under better control. Doing great. H/O osteoporosis, treated with reclast. Last DEXA with osteopenia, off reclast.   Emotional stress with covid and taking care of her Victoria Moss. Her Victoria Moss is 58, in a retirement community, in his own cottage has full time care. Has dementia, COPD, CAD.   She just noticed that her left nipple has slightly inverted.     Patient's last menstrual period was 05/09/2006 (approximate).          Sexually active: No.  The current method of family planning is post menopausal status.    Exercising: Yes.    walking Smoker:  No- former smoker   Health Maintenance: Pap:11/22/2018 WNL          11/17/2016 neg. 08/07/14 neg. HR HPV:neg  History of abnormal Pap:no MMG:10/09/2019 Bir[place] 1 negative Colonoscopy:1/16/18normal.f/u 5 years BMD:02/22/2018 Osteopenia, was taken off Reclast, in Care Everywhere. Followed by Endocrinology. TDaP:08/2014   reports that she quit smoking about 46 years ago. She quit after 7.00 years of use. She has never used smokeless tobacco. She reports that she does not drink alcohol and does not use drugs. Victoria Moss is in a retirement community in [place], [place].  Victoria Moss in [place] has a 53 month old baby girl and has an almost 64 year old boy. Victoria Moss lives in [place], married, doesn't want kids.   Past Medical History:  Diagnosis Date  . Allergy   . Arthritis   . Basal cell cancer   . Diverticulosis    H/O  . GERD (gastroesophageal reflux disease)   . Heart murmur   . Hypertension   . IBS (irritable bowel syndrome)   . Migraine    H/O, none since stopping chocolate  . Osteoporosis   . Rheumatic fever    H/O  . Status post dilation of esophageal narrowing     Past Surgical History:  Procedure Laterality Date  .  APPENDECTOMY  1982  . BREAST BIOPSY Right 05/16   axillary node- neg  . CATARACT EXTRACTION, BILATERAL    . CESAREAN SECTION    . COLONOSCOPY  2018  . DILATION AND CURETTAGE OF UTERUS     x2  . DILATION AND CURETTAGE, DIAGNOSTIC / THERAPEUTIC     x 2  . fatty tumor     on back x2  . HERNIA REPAIR    . parathyroid gland one removed    . PARATHYROIDECTOMY    . SKIN CANCER EXCISION     [facility]   . TOE SURGERY Left    joint replacement bit toe  . TUBAL LIGATION    . UMBILICAL HERNIA REPAIR    . UPPER GI ENDOSCOPY      Current Outpatient Medications  Medication Sig Dispense Refill  . amLODipine (NORVASC) 5 MG tablet TAKE ONE TABLET EVERY DAY 30 tablet 5  . CALCIUM CARB-ERGOCALCIFEROL PO Take 1 tablet by mouth daily.     . cyclobenzaprine (FLEXERIL) 5 MG tablet TAKE 1 TABLET BY MOUTH AT BEDTIME AS NEEDED FOR MUSCLE SPASMS 30 tablet 0  . dicyclomine (BENTYL) 10 MG capsule TAKE 1 CAPSULE BY MOUTH ONCE DAILY AS NEEDED AS DIRECTED BY PHYSICIAN 90 capsule 1  . fluticasone (FLONASE) 50 MCG/ACT nasal spray Place 1 spray into both nostrils daily as needed for allergies or  rhinitis.    . folic acid (FOLVITE) 1 MG tablet Take 1 mg by mouth daily.    Marland Kitchen losartan (COZAAR) 100 MG tablet TAKE ONE TABLET BY MOUTH EVERY DAY 30 tablet 3  . methotrexate (RHEUMATREX) 2.5 MG tablet Take 6  tabs (15 mg ) once a week, 12  weeks, 1 refill    . Multiple Vitamin (MULTI-VITAMINS) TABS Take 1 tablet by mouth daily.     . Omega-3 Fatty Acids (FISH OIL) 1000 MG CAPS Take 1,000 mg by mouth daily.     Marland Kitchen omeprazole (PRILOSEC) 20 MG capsule Take 20 mg by mouth daily.     . rosuvastatin (CRESTOR) 5 MG tablet TAKE ONE TABLET 3 TIMES A WEEK 15 tablet 5  . Turmeric Curcumin 500 MG CAPS Take 500 mg by mouth daily.     No current facility-administered medications for this visit.    Family History  Problem Relation Age of Onset  . Breast cancer Mother 18  . Hypertension Mother   . Stroke Mother        Late 36  .  Arthritis Mother   . Colon polyps Mother   . Cancer Mother        Breast  . Hypertension Father   . Hyperlipidemia Father   . Dementia Father   . Diabetes Neg Hx   . Colon cancer Neg Hx   . Esophageal cancer Neg Hx   . Kidney disease Neg Hx   . Gallbladder disease Neg Hx     Review of Systems  Constitutional: Negative.   HENT: Negative.   Eyes: Negative.   Respiratory: Negative.   Cardiovascular: Negative.   Gastrointestinal: Negative.   Endocrine: Negative.   Genitourinary:       Left nipple inversion Possible nipple drainage from left nipple   Musculoskeletal: Negative.   Skin: Negative.   Allergic/Immunologic: Negative.   Neurological: Negative.   Hematological: Negative.   Psychiatric/Behavioral: Negative.     Exam:   BP 120/68 (BP Location: Right Arm, Patient Position: Sitting, Cuff Size: Normal)   Pulse 72   Resp 14   Ht 5' 5.5" (1.664 m)   Wt 142 lb (64.4 kg)   LMP 05/09/2006 (Approximate)   BMI 23.27 kg/m   Weight change: @WEIGHTCHANGE @ Height:   Height: 5' 5.5" (166.4 cm)  Ht Readings from Last 3 Encounters:  12/05/19 5' 5.5" (1.664 m)  10/25/19 5' 5.75" (1.67 m)  04/18/19 5' 5.75" (1.67 m)    General appearance: alert, cooperative and appears stated age Head: Normocephalic, without obvious abnormality, atraumatic Neck: no adenopathy, supple, symmetrical, trachea midline and thyroid normal to inspection and palpation Lungs: clear to auscultation bilaterally Cardiovascular: regular rate and rhythm Breasts: left nipple inverted, no lumps, dimpling or adenopathy.  Abdomen: soft, non-tender; non distended,  no masses,  no organomegaly Extremities: extremities normal, atraumatic, no cyanosis or edema Skin: Skin color, texture, turgor normal. No rashes or lesions Lymph nodes: Cervical, supraclavicular, and axillary nodes normal. No abnormal inguinal nodes palpated Neurologic: Grossly normal   Pelvic: External genitalia:  no lesions               Urethra:  normal appearing urethra with no masses, tenderness or lesions              Bartholins and Skenes: normal                 Vagina: normal appearing vagina with normal color and discharge, no lesions  Cervix: no lesions               Bimanual Exam:  Uterus:  normal size, contour, position, consistency, mobility, non-tender              Adnexa: no mass, fullness, tenderness               Rectovaginal: Confirms               Anus:  normal sphincter tone, no lesions  Karmen Bongo chaperoned for the exam.  A:  Well Woman with normal exam  Inverted left nipple, new in the last few days  P:   No pap   DEXA with Endocrinology  Mammogram UTD, will schedule diagnostic imaging on the left  Colonoscopy in January, 2023  Discussed breast self exam  Discussed calcium and vit D intake  Labs with primary and rheumatology

## 2019-12-05 NOTE — Telephone Encounter (Signed)
Placed in [facility] hold.  

## 2019-12-05 NOTE — Patient Instructions (Signed)
EXERCISE AND DIET:  We recommended that you start or continue a regular exercise program for good health. Regular exercise means any activity that makes your heart beat faster and makes you sweat.  We recommend exercising at least 30 minutes per day at least 3 days a week, preferably 4 or 5.  We also recommend a diet low in fat and sugar.  Inactivity, poor dietary choices and obesity can cause diabetes, heart attack, stroke, and kidney damage, among others.    ALCOHOL AND SMOKING:  Women should limit their alcohol intake to no more than 7 drinks/beers/glasses of wine (combined, not each!) per week. Moderation of alcohol intake to this level decreases your risk of breast cancer and liver damage. And of course, no recreational drugs are part of a healthy lifestyle.  And absolutely no smoking or even second hand smoke. Most people know smoking can cause heart and lung diseases, but did you know it also contributes to weakening of your bones? Aging of your skin?  Yellowing of your teeth and nails?  CALCIUM AND VITAMIN D:  Adequate intake of calcium and Vitamin D are recommended.  The recommendations for exact amounts of these supplements seem to change often, but generally speaking 1,200 mg of calcium (between diet and supplement) and 800 units of Vitamin D per day seems prudent. Certain women may benefit from higher intake of Vitamin D.  If you are among these women, your doctor will have told you during your visit.    PAP SMEARS:  Pap smears, to check for cervical cancer or precancers,  have traditionally been done yearly, although recent scientific advances have shown that most women can have pap smears less often.  However, every woman still should have a physical exam from her gynecologist every year. It will include a breast check, inspection of the vulva and vagina to check for abnormal growths or skin changes, a visual exam of the cervix, and then an exam to evaluate the size and shape of the uterus and  ovaries.  And after 69 years of age, a rectal exam is indicated to check for rectal cancers. We will also provide age appropriate advice regarding health maintenance, like when you should have certain vaccines, screening for sexually transmitted diseases, bone density testing, colonoscopy, mammograms, etc.   MAMMOGRAMS:  All women over 40 years old should have a yearly mammogram. Many facilities now offer a "3D" mammogram, which may cost around $50 extra out of pocket. If possible,  we recommend you accept the option to have the 3D mammogram performed.  It both reduces the number of women who will be called back for extra views which then turn out to be normal, and it is better than the routine mammogram at detecting truly abnormal areas.    COLON CANCER SCREENING: Now recommend starting at age 45. At this time colonoscopy is not covered for routine screening until 50. There are take home tests that can be done between 45-49.   COLONOSCOPY:  Colonoscopy to screen for colon cancer is recommended for all women at age 50.  We know, you hate the idea of the prep.  We agree, BUT, having colon cancer and not knowing it is worse!!  Colon cancer so often starts as a polyp that can be seen and removed at colonscopy, which can quite literally save your life!  And if your first colonoscopy is normal and you have no family history of colon cancer, most women don't have to have it again for   10 years.  Once every ten years, you can do something that may end up saving your life, right?  We will be happy to help you get it scheduled when you are ready.  Be sure to check your insurance coverage so you understand how much it will cost.  It may be covered as a preventative service at no cost, but you should check your particular policy.      Breast Self-Awareness Breast self-awareness means being familiar with how your breasts look and feel. It involves checking your breasts regularly and reporting any changes to your  health care provider. Practicing breast self-awareness is important. A change in your breasts can be a sign of a serious medical problem. Being familiar with how your breasts look and feel allows you to find any problems early, when treatment is more likely to be successful. All women should practice breast self-awareness, including women who have had breast implants. How to do a breast self-exam One way to learn what is normal for your breasts and whether your breasts are changing is to do a breast self-exam. To do a breast self-exam: Look for Changes  1. Remove all the clothing above your waist. 2. Stand in front of a mirror in a room with good lighting. 3. Put your hands on your hips. 4. Push your hands firmly downward. 5. Compare your breasts in the mirror. Look for differences between them (asymmetry), such as: ? Differences in shape. ? Differences in size. ? Puckers, dips, and bumps in one breast and not the other. 6. Look at each breast for changes in your skin, such as: ? Redness. ? Scaly areas. 7. Look for changes in your nipples, such as: ? Discharge. ? Bleeding. ? Dimpling. ? Redness. ? A change in position. Feel for Changes Carefully feel your breasts for lumps and changes. It is best to do this while lying on your back on the floor and again while sitting or standing in the shower or tub with soapy water on your skin. Feel each breast in the following way:  Place the arm on the side of the breast you are examining above your head.  Feel your breast with the other hand.  Start in the nipple area and make  inch (2 cm) overlapping circles to feel your breast. Use the pads of your three middle fingers to do this. Apply light pressure, then medium pressure, then firm pressure. The light pressure will allow you to feel the tissue closest to the skin. The medium pressure will allow you to feel the tissue that is a little deeper. The firm pressure will allow you to feel the tissue  close to the ribs.  Continue the overlapping circles, moving downward over the breast until you feel your ribs below your breast.  Move one finger-width toward the center of the body. Continue to use the  inch (2 cm) overlapping circles to feel your breast as you move slowly up toward your collarbone.  Continue the up and down exam using all three pressures until you reach your armpit.  Write Down What You Find  Write down what is normal for each breast and any changes that you find. Keep a written record with breast changes or normal findings for each breast. By writing this information down, you do not need to depend only on memory for size, tenderness, or location. Write down where you are in your menstrual cycle, if you are still menstruating. If you are having trouble noticing differences   in your breasts, do not get discouraged. With time you will become more familiar with the variations in your breasts and more comfortable with the exam. How often should I examine my breasts? Examine your breasts every month. If you are breastfeeding, the best time to examine your breasts is after a feeding or after using a breast pump. If you menstruate, the best time to examine your breasts is 5-7 days after your period is over. During your period, your breasts are lumpier, and it may be more difficult to notice changes. When should I see my health care provider? See your health care provider if you notice:  A change in shape or size of your breasts or nipples.  A change in the skin of your breast or nipples, such as a reddened or scaly area.  Unusual discharge from your nipples.  A lump or thick area that was not there before.  Pain in your breasts.  Anything that concerns you.  

## 2019-12-05 NOTE — Telephone Encounter (Signed)
Call placed to [facility] and spoke with [facility].  Pt scheduled for dx left [facility] and [place] on 12/13/19 at 3 pm.   Call placed to pt to give appt at breast center. Spoke with pt. Pt agreeable and verbalized understanding of date and time of appt.   Routing to Dr Talbert Nan for review. Orders placed.  Encounter closed.  Cc: Sharee Pimple, RN for [employer]

## 2019-12-13 ENCOUNTER — Ambulatory Visit
Admission: RE | Admit: 2019-12-13 | Discharge: 2019-12-13 | Disposition: A | Discharge status: Home / Self Care | Payer: Medicare Other | Source: Ambulatory Visit | Attending: Obstetrics and Gynecology | Admitting: Obstetrics and Gynecology

## 2019-12-13 ENCOUNTER — Other Ambulatory Visit: Payer: Self-pay

## 2019-12-13 DIAGNOSIS — N6459 Other signs and symptoms in breast: Secondary | ICD-10-CM

## 2019-12-13 DIAGNOSIS — N6453 Retraction of nipple: Secondary | ICD-10-CM | POA: Diagnosis not present

## 2019-12-13 DIAGNOSIS — R922 Inconclusive mammogram: Secondary | ICD-10-CM | POA: Diagnosis not present

## 2019-12-13 DIAGNOSIS — Z803 Family history of malignant neoplasm of breast: Secondary | ICD-10-CM | POA: Diagnosis not present

## 2019-12-17 ENCOUNTER — Telehealth: Payer: Self-pay | Admitting: Obstetrics and Gynecology

## 2019-12-17 ENCOUNTER — Encounter: Payer: Self-pay | Admitting: Obstetrics and Gynecology

## 2019-12-17 DIAGNOSIS — R922 Inconclusive mammogram: Secondary | ICD-10-CM

## 2019-12-17 DIAGNOSIS — Z803 Family history of malignant neoplasm of breast: Secondary | ICD-10-CM

## 2019-12-17 NOTE — Telephone Encounter (Signed)
Reviewed 12/13/19 left breast Dx [facility].  Family Hx breast cancer, mother at age 69 D density breast  Routing to Dr. Talbert Nan to review and advise on screening breast MRI.

## 2019-12-17 NOTE — Telephone Encounter (Signed)
[name]  P [facility] One of the recommendations from the radiologist who did the breast exam was to have an mri. I was wondering if this something you would recommend at this time or wait and see if I have any other changes in my left breast.  Thank you, Victoria Moss

## 2019-12-18 NOTE — Telephone Encounter (Signed)
I think it is fine to order the breast MRI, I would follow the Radiologists recommendations. The radiologist didn't to a risk assessment model. I'm not sure if this needs to be done.

## 2019-12-18 NOTE — Telephone Encounter (Signed)
Spoke with patient, advised per Dr. Talbert Nan. Patient request to proceed with Breast MRI at [facility], new order placed. Patient aware she will be contacted by imaging facility to schedule.  Advised patient once scheduled our office will precert, will notify if not approved. Advised may require risk assessment model, if this is required will plan referral to genetics. Patient verbalizes understanding and is agreeable to plan.   Call placed to [facility] at [phone]. Was advised once order is signed by provider she will contact patient directly to schedule breast MRI.   Routing to Dr. Talbert Nan to sign new order.   Cc: Hayley Carder

## 2019-12-31 ENCOUNTER — Other Ambulatory Visit: Payer: Self-pay | Admitting: Internal Medicine

## 2020-01-01 ENCOUNTER — Encounter: Payer: Self-pay | Admitting: Internal Medicine

## 2020-01-01 ENCOUNTER — Ambulatory Visit (INDEPENDENT_AMBULATORY_CARE_PROVIDER_SITE_OTHER): Payer: Medicare Other | Admitting: Internal Medicine

## 2020-01-01 ENCOUNTER — Other Ambulatory Visit: Payer: Self-pay

## 2020-01-01 DIAGNOSIS — I701 Atherosclerosis of renal artery: Secondary | ICD-10-CM

## 2020-01-01 DIAGNOSIS — K219 Gastro-esophageal reflux disease without esophagitis: Secondary | ICD-10-CM | POA: Diagnosis not present

## 2020-01-01 DIAGNOSIS — E210 Primary hyperparathyroidism: Secondary | ICD-10-CM | POA: Diagnosis not present

## 2020-01-01 DIAGNOSIS — I773 Arterial fibromuscular dysplasia: Secondary | ICD-10-CM | POA: Diagnosis not present

## 2020-01-01 DIAGNOSIS — I10 Essential (primary) hypertension: Secondary | ICD-10-CM

## 2020-01-01 DIAGNOSIS — G479 Sleep disorder, unspecified: Secondary | ICD-10-CM

## 2020-01-01 DIAGNOSIS — G4700 Insomnia, unspecified: Secondary | ICD-10-CM

## 2020-01-01 DIAGNOSIS — Z0000 Encounter for general adult medical examination without abnormal findings: Secondary | ICD-10-CM

## 2020-01-01 MED ORDER — TRAZODONE HCL 50 MG PO TABS: 25.0000 mg | ORAL_TABLET | Freq: Every evening | ORAL | 1 refills | Status: DC | PRN

## 2020-01-01 NOTE — Progress Notes (Signed)
Patient ID: Victoria Moss, female   DOB: [birth year], 70 y.o.   MRN: [id number]   Subjective:    Patient ID: Victoria Moss, female    DOB: 1950/12/28, 69 y.o.   MRN: [id number]  HPI This visit occurred during the SARS-CoV-2 public health emergency.  Safety protocols were in place, including screening questions prior to the visit, additional usage of staff PPE, and extensive cleaning of exam room while observing appropriate contact time as indicated for disinfecting solutions.  Patient here for her physical exam. Gets her breasts and pelvic exams through gyn.  Recently evaluated for inverted nipple.  S/p mammogram and ultrasound.  Planning for MRI breast.  Trying to stay active.  No chest pain or sob reported.  She is walking.  Doing yard work.  States walked 12-15,000 steps/day.  Has been - water colors/painting.  Increased stress - family medical issues.  Not sleeping well.  Muscle relaxer works for her.  Discussed treatment - trazodone, etc.  Eating.  No vomiting or abdominal pain reported.  No bowel change reported.  Off reclast.   Past Medical History:  Diagnosis Date  . Allergy   . Arthritis   . Basal cell cancer   . Diverticulosis    H/O  . GERD (gastroesophageal reflux disease)   . Heart murmur   . Hypertension   . IBS (irritable bowel syndrome)   . Migraine    H/O, none since stopping chocolate  . Osteoporosis   . Rheumatic fever    H/O  . Status post dilation of esophageal narrowing    Past Surgical History:  Procedure Laterality Date  . APPENDECTOMY  1982  . BREAST BIOPSY Right 05/16   axillary node- neg  . CATARACT EXTRACTION, BILATERAL    . CESAREAN SECTION    . COLONOSCOPY  2018  . DILATION AND CURETTAGE OF UTERUS     x2  . DILATION AND CURETTAGE, DIAGNOSTIC / THERAPEUTIC     x 2  . fatty tumor     on back x2  . HERNIA REPAIR    . parathyroid gland one removed    . PARATHYROIDECTOMY    . SKIN CANCER EXCISION     [facility]   . TOE SURGERY Left     joint replacement bit toe  . TUBAL LIGATION    . UMBILICAL HERNIA REPAIR    . UPPER GI ENDOSCOPY     Family History  Problem Relation Age of Onset  . Breast cancer Mother 63  . Hypertension Mother   . Stroke Mother        Late 35  . Arthritis Mother   . Colon polyps Mother   . Cancer Mother        Breast  . Hypertension Father   . Hyperlipidemia Father   . Dementia Father   . Diabetes Neg Hx   . Colon cancer Neg Hx   . Esophageal cancer Neg Hx   . Kidney disease Neg Hx   . Gallbladder disease Neg Hx    Social History   Socioeconomic History  . Marital status: Widowed    Spouse name: Not on file  . Number of children: 2  . Years of education: Not on file  . Highest education level: Not on file  Occupational History  . Occupation: Retired    Fish farm manager: [employer]  Tobacco Use  . Smoking status: Former Smoker    Years: 7.00    Quit date: 05/09/1973    Years since quitting:  46.7  . Smokeless tobacco: Never Used  . Tobacco comment: Quit at age 69  Vaping Use  . Vaping Use: Never used  Substance and Sexual Activity  . Alcohol use: No    Alcohol/week: 0.0 standard drinks  . Drug use: No  . Sexual activity: Not Currently  Other Topics Concern  . Not on file  Social History Narrative  . Not on file   Social Determinants of Health   Financial Resource Strain:   . Difficulty of Paying Living Expenses: Not on file  Food Insecurity:   . Worried About Charity fundraiser in the Last Year: Not on file  . Ran Out of Food in the Last Year: Not on file  Transportation Needs:   . Lack of Transportation (Medical): Not on file  . Lack of Transportation (Non-Medical): Not on file  Physical Activity:   . Days of Exercise per Week: Not on file  . Minutes of Exercise per Session: Not on file  Stress:   . Feeling of Stress : Not on file  Social Connections:   . Frequency of Communication with Friends and Family: Not on file  . Frequency of Social Gatherings with Friends and  Family: Not on file  . Attends Religious Services: Not on file  . Active Member of Clubs or Organizations: Not on file  . Attends Archivist Meetings: Not on file  . Marital Status: Not on file    Outpatient Encounter Medications as of 01/01/2020  Medication Sig  . amLODipine (NORVASC) 5 MG tablet TAKE ONE TABLET EVERY DAY  . CALCIUM CARB-ERGOCALCIFEROL PO Take 1 tablet by mouth daily.   . cyclobenzaprine (FLEXERIL) 5 MG tablet TAKE 1 TABLET BY MOUTH AT BEDTIME AS NEEDED FOR MUSCLE SPASMS  . dicyclomine (BENTYL) 10 MG capsule TAKE 1 CAPSULE BY MOUTH ONCE DAILY AS NEEDED AS DIRECTED BY PHYSICIAN  . fluticasone (FLONASE) 50 MCG/ACT nasal spray Place 1 spray into both nostrils daily as needed for allergies or rhinitis.  . folic acid (FOLVITE) 1 MG tablet Take 1 mg by mouth daily.  Marland Kitchen losartan (COZAAR) 100 MG tablet TAKE ONE TABLET BY MOUTH EVERY DAY  . methotrexate (RHEUMATREX) 2.5 MG tablet Take 6  tabs (15 mg ) once a week, 12  weeks, 1 refill  . Multiple Vitamin (MULTI-VITAMINS) TABS Take 1 tablet by mouth daily.   . Omega-3 Fatty Acids (FISH OIL) 1000 MG CAPS Take 1,000 mg by mouth daily.   Marland Kitchen omeprazole (PRILOSEC) 20 MG capsule Take 20 mg by mouth daily.   . rosuvastatin (CRESTOR) 5 MG tablet TAKE ONE TABLET 3 TIMES A WEEK  . traZODone (DESYREL) 50 MG tablet Take 0.5-1 tablets (25-50 mg total) by mouth at bedtime as needed for sleep.  . Turmeric Curcumin 500 MG CAPS Take 500 mg by mouth daily.   No facility-administered encounter medications on file as of 01/01/2020.    Review of Systems  Constitutional: Negative for appetite change and unexpected weight change.  HENT: Negative for congestion and sinus pressure.   Eyes: Negative for pain and visual disturbance.  Respiratory: Negative for cough, chest tightness and shortness of breath.   Cardiovascular: Negative for chest pain, palpitations and leg swelling.  Gastrointestinal: Negative for abdominal pain, diarrhea, nausea and  vomiting.  Genitourinary: Negative for difficulty urinating and dysuria.  Musculoskeletal: Negative for joint swelling and myalgias.  Skin: Negative for color change and rash.  Neurological: Negative for dizziness, light-headedness and headaches.  Hematological: Negative for adenopathy.  Does not bruise/bleed easily.  Psychiatric/Behavioral: Negative for agitation and dysphoric mood.       Increased stress.         Objective:    Physical Exam Vitals reviewed.  Constitutional:      General: She is not in acute distress.    Appearance: Normal appearance.  HENT:     Head: Normocephalic and atraumatic.     Right Ear: External ear normal.     Left Ear: External ear normal.  Eyes:     General: No scleral icterus.       Right eye: No discharge.        Left eye: No discharge.     Conjunctiva/sclera: Conjunctivae normal.  Neck:     Thyroid: No thyromegaly.  Cardiovascular:     Rate and Rhythm: Normal rate and regular rhythm.  Pulmonary:     Effort: No respiratory distress.     Breath sounds: Normal breath sounds. No wheezing.  Abdominal:     General: Bowel sounds are normal.     Palpations: Abdomen is soft.     Tenderness: There is no abdominal tenderness.  Musculoskeletal:        General: No swelling or tenderness.     Cervical back: Neck supple. No tenderness.  Lymphadenopathy:     Cervical: No cervical adenopathy.  Skin:    Findings: No erythema or rash.  Neurological:     Mental Status: She is alert.  Psychiatric:        Mood and Affect: Mood normal.        Behavior: Behavior normal.     BP 124/74   Pulse 63   Temp 98 F (36.7 C) (Oral)   Resp 16   Ht 5\' 6"  (1.676 m)   Wt 144 lb 12.8 oz (65.7 kg)   LMP 05/09/2006 (Approximate)   SpO2 97%   BMI 23.37 kg/m  Wt Readings from Last 3 Encounters:  01/01/20 144 lb 12.8 oz (65.7 kg)  12/05/19 142 lb (64.4 kg)  10/25/19 142 lb (64.4 kg)     Lab Results  Component Value Date   WBC 5.2 08/13/2019   HGB 12.6  09/26/2019   HCT 34.3 (L) 08/13/2019   PLT 222.0 08/13/2019   GLUCOSE 89 07/01/2019   CHOL 172 07/01/2019   TRIG 93.0 07/01/2019   HDL 56.00 07/01/2019   LDLDIRECT 114.0 12/17/2018   LDLCALC 98 07/01/2019   ALT 28 08/14/2019   AST 23 08/14/2019   NA 139 07/01/2019   K 4.2 07/01/2019   CL 103 07/01/2019   CREATININE 0.81 07/01/2019   BUN 14 07/01/2019   CO2 30 07/01/2019   TSH 0.79 12/17/2018   HGBA1C 5.6 12/31/2014    US BREAST LTD UNI LEFT INC AXILLA  Result Date: 12/13/2019 CLINICAL DATA:  Left nipple retraction. Family history of breast cancer, mother with breast cancer. EXAM: DIGITAL DIAGNOSTIC left MAMMOGRAM WITH CAD AND TOMO ULTRASOUND left BREAST COMPARISON:  Prior films ACR Breast Density Category d: The breast tissue is extremely dense, which lowers the sensitivity of mammography. FINDINGS: Cc and MLO views of the left breast are submitted. No suspicious abnormalities identified bilaterally. Stable asymmetry is identified in the upper-outer quadrant left breast unchanged. Mammographic images were processed with CAD. On physical exam, minimal left nipple retraction is noted. Targeted ultrasound is performed, showing no focal abnormal discrete cystic or solid lesion in the retroareolar left breast. IMPRESSION: Benign findings. RECOMMENDATION: Recommend management on clinical basis for patient's clinical complaint.  Consider screening MRI of the breasts given density of breast and family history of breast cancer. I have discussed the findings and recommendations with the patient. If applicable, a reminder letter will be sent to the patient regarding the next appointment. BI-RADS CATEGORY  2: Benign. Electronically Signed   By: Abelardo Diesel M.D.   On: 12/13/2019 15:39   MM DIAG BREAST TOMO UNI LEFT  Result Date: 12/13/2019 CLINICAL DATA:  Left nipple retraction. Family history of breast cancer, mother with breast cancer. EXAM: DIGITAL DIAGNOSTIC left MAMMOGRAM WITH CAD AND TOMO  ULTRASOUND left BREAST COMPARISON:  Prior films ACR Breast Density Category d: The breast tissue is extremely dense, which lowers the sensitivity of mammography. FINDINGS: Cc and MLO views of the left breast are submitted. No suspicious abnormalities identified bilaterally. Stable asymmetry is identified in the upper-outer quadrant left breast unchanged. Mammographic images were processed with CAD. On physical exam, minimal left nipple retraction is noted. Targeted ultrasound is performed, showing no focal abnormal discrete cystic or solid lesion in the retroareolar left breast. IMPRESSION: Benign findings. RECOMMENDATION: Recommend management on clinical basis for patient's clinical complaint. Consider screening MRI of the breasts given density of breast and family history of breast cancer. I have discussed the findings and recommendations with the patient. If applicable, a reminder letter will be sent to the patient regarding the next appointment. BI-RADS CATEGORY  2: Benign. Electronically Signed   By: Abelardo Diesel M.D.   On: 12/13/2019 15:39       Assessment & Plan:   Problem List Items Addressed This Visit    Sleep difficulties    Discussed with her today.  Discussed treatment options.  Trial of trazodone.  Follow.       Primary hyperparathyroidism [facility])    Previously followed by endocrinology.  Follow calcium level.       Insomnia    Increased stress.  Some trouble sleeping.  Discussed trazodone - trial.  Follow.        Hypertension    Blood pressure as outlined.  Continue losartan and amlodipine. Follow pressures.  Follow metabolic panel.        Relevant Orders   Hepatic function panel   Lipid panel   Basic metabolic panel   Health care maintenance    Physical today 01/01/20.  Gets her breasts and pelvic exams through gyn.  Mammogram 10/10/19 - [facility] planning for Breast MRI.   Colonoscopy 09/06/16      GERD (gastroesophageal reflux disease)    No upper symptoms reported.  On  omeprazole.        Fibromuscular dysplasia ([place])    Evaluated by [facility].  Recommended f/u in 2 years.  Per note, would have been due f/u in 04/2019.  If has not had f/u, need to schedule.            Einar Pheasant, MD

## 2020-01-08 ENCOUNTER — Ambulatory Visit
Admission: RE | Admit: 2020-01-08 | Discharge: 2020-01-08 | Disposition: A | Discharge status: Home / Self Care | Payer: Medicare Other | Source: Ambulatory Visit | Attending: Obstetrics and Gynecology | Admitting: Obstetrics and Gynecology

## 2020-01-08 ENCOUNTER — Other Ambulatory Visit: Payer: Self-pay

## 2020-01-08 DIAGNOSIS — R922 Inconclusive mammogram: Secondary | ICD-10-CM

## 2020-01-08 DIAGNOSIS — Z803 Family history of malignant neoplasm of breast: Secondary | ICD-10-CM

## 2020-01-08 DIAGNOSIS — N6453 Retraction of nipple: Secondary | ICD-10-CM | POA: Diagnosis not present

## 2020-01-08 MED ORDER — GADOBUTROL 1 MMOL/ML IV SOLN
6.0000 mL | Freq: Once | INTRAVENOUS | Status: AC | PRN
  Administered 2020-01-08: 6 mL via INTRAVENOUS

## 2020-01-11 ENCOUNTER — Telehealth: Payer: Self-pay | Admitting: Internal Medicine

## 2020-01-11 ENCOUNTER — Encounter: Payer: Self-pay | Admitting: Internal Medicine

## 2020-01-11 DIAGNOSIS — G479 Sleep disorder, unspecified: Secondary | ICD-10-CM | POA: Insufficient documentation

## 2020-01-11 DIAGNOSIS — I6523 Occlusion and stenosis of bilateral carotid arteries: Secondary | ICD-10-CM

## 2020-01-11 DIAGNOSIS — I701 Atherosclerosis of renal artery: Secondary | ICD-10-CM

## 2020-01-11 DIAGNOSIS — I773 Arterial fibromuscular dysplasia: Secondary | ICD-10-CM

## 2020-01-11 NOTE — Assessment & Plan Note (Signed)
Blood pressure as outlined.  Continue losartan and amlodipine.  Follow pressures.  Follow metabolic panel.   

## 2020-01-11 NOTE — Telephone Encounter (Signed)
Per review of chart, pt previously saw [facility] for evaluation and f/u carotid arteries.  Per note, needs f/u.  If agreeable, let me know and I will place the order for the referral.

## 2020-01-11 NOTE — Assessment & Plan Note (Signed)
Evaluated by [facility].  Recommended f/u in 2 years.  Per note, would have been due f/u in 04/2019.  If has not had f/u, need to schedule.

## 2020-01-11 NOTE — Assessment & Plan Note (Signed)
Discussed with her today.  Discussed treatment options.  Trial of trazodone.  Follow.

## 2020-01-11 NOTE — Assessment & Plan Note (Addendum)
Physical today 01/01/20.  Gets her breasts and pelvic exams through gyn.  Mammogram 10/10/19 - [facility] planning for Breast MRI.   Colonoscopy 09/06/16

## 2020-01-11 NOTE — Assessment & Plan Note (Signed)
Previously followed by endocrinology.  Follow calcium level.

## 2020-01-11 NOTE — Assessment & Plan Note (Signed)
No upper symptoms reported.  On omeprazole.  

## 2020-01-11 NOTE — Assessment & Plan Note (Signed)
Increased stress.  Some trouble sleeping.  Discussed trazodone - trial.  Follow.

## 2020-01-14 NOTE — Telephone Encounter (Signed)
Patient agreeable to referral and scheduled her for a flu shot as well

## 2020-01-15 NOTE — Telephone Encounter (Signed)
Order placed for referral back to [facility]

## 2020-01-15 NOTE — Addendum Note (Signed)
Addended by: Alisa Graff on: 01/15/2020 05:14 AM   Modules accepted: Orders

## 2020-01-16 ENCOUNTER — Other Ambulatory Visit: Payer: Self-pay | Admitting: General Surgery

## 2020-01-16 DIAGNOSIS — D171 Benign lipomatous neoplasm of skin and subcutaneous tissue of trunk: Secondary | ICD-10-CM | POA: Diagnosis not present

## 2020-01-16 NOTE — Progress Notes (Signed)
Subjective:     Patient ID: Victoria Moss is a 69 y.o. female.  HPI  The following portions of the patient's history were reviewed and updated as appropriate.  This an established patient is here today for: office visit. Here for evaluation of a possible lipoma on her back. She states the area has been there for at least 20 years. She states it has been removed twice in [place], [place] and it keeps coming back. Tender with direct pressure.  The patient reports her second excision was completed under anesthesia.  3-4 years later it began to recur.  It is reached its original size prior to excision and been stable since then.  Methotrexate for arthritis.  Bra 36B  Review of Systems  Constitutional: Negative for chills and fever.  Respiratory: Negative for cough.         Chief Complaint  Patient presents with  . Lipoma    back     BP 136/80   Pulse 61   Temp 36.3 C (97.4 F)   Ht 166.4 cm (5' 5.5")   Wt 65.8 kg (145 lb)   SpO2 97%   BMI 23.76 kg/m   Past Medical History:  Diagnosis Date  . Allergy   . Anxiety   . Basal cell carcinoma   . Carotid stenosis   . Diverticulitis   . Erosive gastritis 06/07/2017  . FH: colon polyps 02/26/2016  . Fibromuscular dysplasia (CMS-HCC)   . GERD (gastroesophageal reflux disease)   . Heart murmur, unspecified   . History of rheumatic fever   . Hyperparathyroidism, primary (CMS-HCC)   . Hypertension   . IBS (irritable bowel syndrome)    questionable  . Insomnia   . Migraine   . Osteoarthritis   . Osteoporosis, post-menopausal   . PONV (postoperative nausea and vomiting)   . Renal artery stenosis (CMS-HCC)   . Rheumatic fever           Past Surgical History:  Procedure Laterality Date  . APPENDECTOMY  1982  . CESAREAN SECTION    . COLONOSCOPY  05/24/2009   FH Colon Polyps (Mother)  . COLONOSCOPY  05/24/2016   FH Colon Polyps (Mother): CBF 05/2021  . DILATION AND CURETTAGE,  DIAGNOSTIC / THERAPEUTIC    . EGD  04/28/2010, 04/28/2009   Dilated  . EGD  05/25/2017   Gastritis: No repeat per RTE  . excision lipoma     x2 in [place], [place]  . EYE SURGERY Bilateral 2021   cataract surgery  . Fatty tumor removal     back  . GREAT TOE ARTHRODESIS, METATARSALPHALANGEAL JOINT    . HERNIA REPAIR     umbilical  . Left foot surgery for arthritis    . MONITORING CRANIAL NERVES UNILATERAL N/A 07/01/2015   Procedure: MONITORING CRANIAL NERVES UNILATERAL- nirvana nerve monitor;  Surgeon: Isla Pence, MD;  Location[facility];  Service: General Surgery;  Laterality: N/A;  . PARATHYROIDECTOMY N/A 07/01/2015   Procedure: (RCC) PARATHYROIDECTOMY,  ;  Surgeon: Isla Pence, MD;  Location: [facility] OR;  Service: General Surgery;  Laterality: N/A;  . PERCUTANEOUS BIOPSY BREAST Right 09/2014  . skin cancer excision    . TUBAL LIGATION        OB History   No obstetric history on file.     Social History          Socioeconomic History  . Marital status: Widowed    Spouse name: Not on file  . Number  of children: Not on file  . Years of education: Not on file  . Highest education level: Not on file  Occupational History  . Occupation: Retired [employer]  Tobacco Use  . Smoking status: Former Smoker    Packs/day: 1.00    Years: 5.00    Pack years: 5.00    Types: Cigarettes    Quit date: 1978    Years since quitting: 43.7  . Smokeless tobacco: Never Used  . Tobacco comment: exposure to second hand smoke  Vaping Use  . Vaping Use: Never used  Substance and Sexual Activity  . Alcohol use: No  . Drug use: Never  . Sexual activity: Not on file  Other Topics Concern  . Not on file  Social History Narrative  . Not on file   Social Determinants of Health      Financial Resource Strain:   . Difficulty of Paying Living Expenses:   Food Insecurity:   . Worried About Charity fundraiser in the Last  Year:   . Arboriculturist in the Last Year:   Transportation Needs:   . Film/video editor (Medical):   Marland Kitchen Lack of Transportation (Non-Medical):            Allergies  Allergen Reactions  . Codeine Phosphate Itching and Rash  . Milk Diarrhea and Abdominal Pain  . Penicillin G Itching and Rash  . Propofol Nausea And Vomiting    Current Medications        Current Outpatient Medications  Medication Sig Dispense Refill  . amLODIPine (NORVASC) 5 MG tablet Take 5 mg by mouth once daily    . calcium carbonate-vitamin D3 (OYSTER SHELL CALCIUM-VIT D3) 500 mg(1,250mg ) -200 unit tablet Take 1 tablet by mouth once daily       . cyclobenzaprine HCl (FEXMID ORAL) Take 5 mg by mouth once daily as needed    . dicyclomine (BENTYL) 10 mg capsule Take 10 mg by mouth nightly as needed.    . docosahexanoic acid-epa (FISH OIL) 120-180 mg Cap Take by mouth once daily.    . fluticasone propionate (FLONASE) 50 mcg/actuation nasal spray once daily as needed    . folic acid (FOLVITE) 1 MG tablet TAKE ONE TABLET EVERY DAY 90 tablet 4  . losartan (COZAAR) 100 MG tablet Take 100 mg by mouth once daily      . methotrexate (RHEUMATREX) 2.5 MG tablet Take 6 tablets (15 mg total) by mouth every 7 (seven) days 72 tablet 4  . multivitamin tablet Take 1 tablet by mouth once daily.    Marland Kitchen omeprazole (PRILOSEC) 20 MG DR capsule Take 1 capsule (20 mg total) by mouth once daily 90 capsule 3  . rosuvastatin (CRESTOR) 5 MG tablet TAKE ONE TABLET 3 TIMES A WEEK    . TURMERIC ORAL Take by mouth     No current facility-administered medications for this visit.           Family History  Problem Relation Age of Onset  . Breast cancer Mother 40  . Colon polyps Mother   . Osteoporosis (Thinning of bones) Mother   . Dementia Mother   . High blood pressure (Hypertension) Mother   . Stroke Mother   . Arthritis Mother   . Cancer Mother   . Lung cancer Maternal Grandmother   . Melanoma  Other        w/ metastatic involvement to brain and lung.  . High blood pressure (Hypertension) Father   .  Hyperlipidemia (Elevated cholesterol) Father   . Pacemaker Father   . Heart disease Father   . Dementia Father   . High blood pressure (Hypertension) Brother   . Hyperlipidemia (Elevated cholesterol) Brother   . Alcohol abuse Maternal Grandfather   . Cancer Maternal Grandfather   . Dementia Paternal Grandmother   . Diabetes Paternal Grandmother   . Alzheimer's disease Paternal Grandfather   . Hyperlipidemia (Elevated cholesterol) Brother   . No Known Problems Daughter   . No Known Problems Daughter   . Colon cancer Neg Hx         Objective:   Physical Exam Constitutional:      Appearance: Normal appearance.  Cardiovascular:     Rate and Rhythm: Normal rate and regular rhythm.     Pulses: Normal pulses.     Heart sounds: Normal heart sounds.  Pulmonary:     Effort: Pulmonary effort is normal.     Breath sounds: Normal breath sounds.  Musculoskeletal:     Cervical back: Neck supple.  Skin:    General: Skin is warm and dry.          Comments: lipoma  Neurological:     Mental Status: She is alert and oriented to person, place, and time.  Psychiatric:        Mood and Affect: Mood normal.        Behavior: Behavior normal.     Labs and Radiology:    CBC of August 13, 2019 showed a modest decrease in hemoglobin to 11.9.  Prior values in 2019 was 12.8.  Normal MCV at 95.8.  White blood cell count of 5200.  Platelet count of 222,000.  Hepatic function panel at that time was normal.  Iron levels were in the normal range.  Slightly low saturation.  Repeat hemoglobin in May 2021 was normal at 12.6.  Baseline.      Assessment:     Recurrent lipoma of the left back.    Plan:     Based on the prior scarring in the size I have recommended excision under anesthesia as an outpatient at the hospital.  We will likely make use of a  compressive wrap at that time to minimize the chance of seroma formation.  This will be scheduled at a convenient date when the patient has transportation available. Patient to be tentatively scheduled for surgery on 03-20-20 at [facility] per her request.     Entered by Karie Fetch, RN, acting as a scribe for Dr. Hervey Ard, MD.  The documentation recorded by the scribe accurately reflects the service I personally performed and the decisions made by me.   [name], MD FACS

## 2020-01-17 ENCOUNTER — Other Ambulatory Visit: Payer: Self-pay

## 2020-01-17 ENCOUNTER — Ambulatory Visit (INDEPENDENT_AMBULATORY_CARE_PROVIDER_SITE_OTHER): Payer: Medicare Other

## 2020-01-17 ENCOUNTER — Telehealth: Payer: Self-pay

## 2020-01-17 ENCOUNTER — Encounter: Payer: Self-pay | Admitting: Internal Medicine

## 2020-01-17 DIAGNOSIS — Z23 Encounter for immunization: Secondary | ICD-10-CM | POA: Diagnosis not present

## 2020-01-17 NOTE — Telephone Encounter (Signed)
Spoke with Victoria Moss at [facility], was advised radiologist has been notified, report will be read and completed by 01/20/20.   Call placed to Victoria Moss to provide update. Victoria Moss is aware she will be notified once report has been completed and reviewed by Dr. Talbert Nan.   Continue [facility] hold.   Routing to Dr. Talbert Nan for final results once completed.

## 2020-01-17 NOTE — Telephone Encounter (Signed)
Patient is calling in regards to mammogram results.

## 2020-01-17 NOTE — Telephone Encounter (Signed)
Spoke with patient. Patient had a breast MRI completed at [facility] on 01/08/20, requesting results. Per review of Epic, report not completed. Advised patient I will contact [facility] radiology and f/u with update. Patient thankful and verbalizes understanding.   Call placed to [facility] MRI at [phone], spoke with Dorian Pod. She will f/u with radiologist and return call to [facility] provide update.

## 2020-01-23 ENCOUNTER — Other Ambulatory Visit: Payer: Self-pay | Admitting: Internal Medicine

## 2020-01-29 ENCOUNTER — Other Ambulatory Visit: Payer: Self-pay | Admitting: Internal Medicine

## 2020-01-30 DIAGNOSIS — M0600 Rheumatoid arthritis without rheumatoid factor, unspecified site: Secondary | ICD-10-CM | POA: Diagnosis not present

## 2020-01-30 DIAGNOSIS — Z79899 Other long term (current) drug therapy: Secondary | ICD-10-CM | POA: Diagnosis not present

## 2020-02-06 ENCOUNTER — Telehealth: Payer: Self-pay | Admitting: Internal Medicine

## 2020-02-06 DIAGNOSIS — R300 Dysuria: Secondary | ICD-10-CM

## 2020-02-06 NOTE — Telephone Encounter (Signed)
Frequent urination and painful urination x 3 days. She is coming in at 9:30 tomorrow to leave urine sample and will do virtual with Dr Nicki Reaper tomorrow at [employer]

## 2020-02-06 NOTE — Telephone Encounter (Signed)
Patient called in stated that she may have a UTI wanted to come in lab or drop off a urine sample.

## 2020-02-07 ENCOUNTER — Other Ambulatory Visit: Payer: Self-pay

## 2020-02-07 ENCOUNTER — Telehealth (INDEPENDENT_AMBULATORY_CARE_PROVIDER_SITE_OTHER): Payer: Medicare Other | Admitting: Internal Medicine

## 2020-02-07 ENCOUNTER — Other Ambulatory Visit (INDEPENDENT_AMBULATORY_CARE_PROVIDER_SITE_OTHER): Payer: Medicare Other

## 2020-02-07 DIAGNOSIS — R300 Dysuria: Secondary | ICD-10-CM

## 2020-02-07 DIAGNOSIS — E210 Primary hyperparathyroidism: Secondary | ICD-10-CM | POA: Diagnosis not present

## 2020-02-07 DIAGNOSIS — R350 Frequency of micturition: Secondary | ICD-10-CM | POA: Diagnosis not present

## 2020-02-07 DIAGNOSIS — K219 Gastro-esophageal reflux disease without esophagitis: Secondary | ICD-10-CM

## 2020-02-07 DIAGNOSIS — I701 Atherosclerosis of renal artery: Secondary | ICD-10-CM | POA: Diagnosis not present

## 2020-02-07 DIAGNOSIS — I10 Essential (primary) hypertension: Secondary | ICD-10-CM | POA: Diagnosis not present

## 2020-02-07 DIAGNOSIS — N949 Unspecified condition associated with female genital organs and menstrual cycle: Secondary | ICD-10-CM | POA: Diagnosis not present

## 2020-02-07 DIAGNOSIS — N9489 Other specified conditions associated with female genital organs and menstrual cycle: Secondary | ICD-10-CM

## 2020-02-07 LAB — URINALYSIS, ROUTINE W REFLEX MICROSCOPIC
Bilirubin Urine: NEGATIVE
Hgb urine dipstick: NEGATIVE
Ketones, ur: NEGATIVE
Leukocytes,Ua: NEGATIVE
Nitrite: NEGATIVE
RBC / HPF: NONE SEEN (ref 0–?)
Specific Gravity, Urine: 1.015 (ref 1.000–1.030)
Total Protein, Urine: NEGATIVE
Urine Glucose: NEGATIVE
Urobilinogen, UA: 0.2 (ref 0.0–1.0)
WBC, UA: NONE SEEN (ref 0–?)
pH: 6 (ref 5.0–8.0)

## 2020-02-07 MED ORDER — NYSTATIN 100000 UNIT/GM EX CREA: 1.0000 "application " | TOPICAL_CREAM | Freq: Two times a day (BID) | CUTANEOUS | 0 refills | Status: DC

## 2020-02-07 NOTE — Progress Notes (Signed)
Patient ID: Victoria Moss, female   DOB: 10-Mar-1951, 69 y.o.   MRN: [id number]   Virtual Visit via video Note  This visit type was conducted due to national recommendations for restrictions regarding the COVID-19 pandemic (e.g. social distancing).  This format is felt to be most appropriate for this patient at this time.  All issues noted in this document were discussed and addressed.  No physical exam was performed (except for noted visual exam findings with Video Visits).   I connected with Avalynne Diver today by a video enabled telemedicine application and verified that I am speaking with the correct person using two identifiers. Location patient: home Location provider: work  Persons participating in the virtual visit: patient, provider  The limitations, risks, security and privacy concerns of performing an evaluation and management service by video and the availability of in person appointments have been discussed.  It has also been discussed with the patient that there may be a patient responsible charge related to this service. The patient expressed understanding and agreed to proceed.   Reason for visit: work in appt  HPI: She called in with concerns regarding frequent urination and painful urination.  Was questioning if had UTI.  Urinalysis clear.  On questioning her, she has noticed vaginal burning.  This has been a persistent intermittent problem for 3-4 years.  Describes external burning.  No vaginal discharge.has tried changing toilet paper, laundry detergent, etc.  No chest pain or sob reported.  No acid reflux.  Does report noticing occasional bloating with some pressure.  Some increased frequency.  No change in bowels.  Has noticed redness around her rectum.    ROS: See pertinent positives and negatives per HPI.  Past Medical History:  Diagnosis Date  . Allergy   . Arthritis   . Basal cell cancer   . Diverticulosis    H/O  . GERD (gastroesophageal reflux  disease)   . Heart murmur   . Hypertension   . IBS (irritable bowel syndrome)   . Migraine    H/O, none since stopping chocolate  . Osteoporosis   . Rheumatic fever    H/O  . Status post dilation of esophageal narrowing     Past Surgical History:  Procedure Laterality Date  . APPENDECTOMY  1982  . BREAST BIOPSY Right 05/16   axillary node- neg  . CATARACT EXTRACTION, BILATERAL    . CESAREAN SECTION    . COLONOSCOPY  2018  . DILATION AND CURETTAGE OF UTERUS     x2  . DILATION AND CURETTAGE, DIAGNOSTIC / THERAPEUTIC     x 2  . fatty tumor     on back x2  . HERNIA REPAIR    . parathyroid gland one removed    . PARATHYROIDECTOMY    . SKIN CANCER EXCISION     [facility]   . TOE SURGERY Left    joint replacement bit toe  . TUBAL LIGATION    . UMBILICAL HERNIA REPAIR    . UPPER GI ENDOSCOPY      Family History  Problem Relation Age of Onset  . Breast cancer Mother 66  . Hypertension Mother   . Stroke Mother        Late 20  . Arthritis Mother   . Colon polyps Mother   . Cancer Mother        Breast  . Hypertension Father   . Hyperlipidemia Father   . Dementia Father   . Diabetes Neg Hx   .  Colon cancer Neg Hx   . Esophageal cancer Neg Hx   . Kidney disease Neg Hx   . Gallbladder disease Neg Hx     SOCIAL HX: reviewed.    Current Outpatient Medications:  .  amLODipine (NORVASC) 5 MG tablet, TAKE ONE TABLET EVERY DAY, Disp: 30 tablet, Rfl: 5 .  CALCIUM CARB-ERGOCALCIFEROL PO, Take 1 tablet by mouth daily. , Disp: , Rfl:  .  cyclobenzaprine (FLEXERIL) 5 MG tablet, TAKE ONE TABLET AT BEDTIME AS NEEDED FORMUSCLE SPASM, Disp: 30 tablet, Rfl: 0 .  dicyclomine (BENTYL) 10 MG capsule, TAKE 1 CAPSULE BY MOUTH ONCE DAILY AS NEEDED AS DIRECTED BY PHYSICIAN, Disp: 90 capsule, Rfl: 1 .  fluticasone (FLONASE) 50 MCG/ACT nasal spray, Place 1 spray into both nostrils daily as needed for allergies or rhinitis., Disp: , Rfl:  .  folic acid (FOLVITE) 1 MG tablet, Take 1 mg by mouth  daily., Disp: , Rfl:  .  losartan (COZAAR) 100 MG tablet, TAKE ONE TABLET BY MOUTH EVERY DAY, Disp: 30 tablet, Rfl: 3 .  methotrexate (RHEUMATREX) 2.5 MG tablet, Take 6  tabs (15 mg ) once a week, 12  weeks, 1 refill, Disp: , Rfl:  .  Multiple Vitamin (MULTI-VITAMINS) TABS, Take 1 tablet by mouth daily. , Disp: , Rfl:  .  nystatin cream (MYCOSTATIN), Apply 1 application topically 2 (two) times daily., Disp: 30 g, Rfl: 0 .  Omega-3 Fatty Acids (FISH OIL) 1000 MG CAPS, Take 1,000 mg by mouth daily. , Disp: , Rfl:  .  omeprazole (PRILOSEC) 20 MG capsule, Take 20 mg by mouth daily. , Disp: , Rfl:  .  rosuvastatin (CRESTOR) 5 MG tablet, TAKE ONE TABLET 3 TIMES A WEEK, Disp: 15 tablet, Rfl: 5 .  traZODone (DESYREL) 50 MG tablet, Take 0.5-1 tablets (25-50 mg total) by mouth at bedtime as needed for sleep., Disp: 30 tablet, Rfl: 1 .  Turmeric Curcumin 500 MG CAPS, Take 500 mg by mouth daily., Disp: , Rfl:   EXAM:  GENERAL: alert, oriented, appears well and in no acute distress  HEENT: atraumatic, conjunttiva clear, no obvious abnormalities on inspection of external nose and ears  NECK: normal movements of the head and neck  LUNGS: on inspection no signs of respiratory distress, breathing rate appears normal, no obvious gross SOB, gasping or wheezing  CV: no obvious cyanosis  PSYCH/NEURO: pleasant and cooperative, no obvious depression or anxiety, speech and thought processing grossly intact  ASSESSMENT AND PLAN:  Discussed the following assessment and plan:  Hypertension Blood pressure has been under control on losartan and amlodipine.  Follow pressures.  Follow metabolic panel.   GERD (gastroesophageal reflux disease) On omeprazole.  No upper symptoms reported.    Primary hyperparathyroidism ([place]) Has been followed by endocrinology.   Urinary frequency Noticed some urinary frequency and bloating as outlined.  Urinalysis clear.  Hold on abx.  Vaginal burning and rectal irritation as  outlined.  Nystatin cream as directed.  Discussed f/u with gyn.    Vaginal burning Vaginal burning as outlined.  Also peri rectal irritation.  Trial of nystatin cream.  Discussed referral back to gyn.  Follow.     Meds ordered this encounter  Medications  . nystatin cream (MYCOSTATIN)    Sig: Apply 1 application topically 2 (two) times daily.    Dispense:  30 g    Refill:  0     I discussed the assessment and treatment plan with the patient. The patient was provided an opportunity  to ask questions and all were answered. The patient agreed with the plan and demonstrated an understanding of the instructions.   The patient was advised to call back or seek an in-person evaluation if the symptoms worsen or if the condition fails to improve as anticipated.     Einar Pheasant, MD

## 2020-02-09 LAB — URINE CULTURE
MICRO NUMBER:: 11021199
Result:: NO GROWTH
SPECIMEN QUALITY:: ADEQUATE

## 2020-02-11 ENCOUNTER — Other Ambulatory Visit: Payer: Self-pay | Admitting: Internal Medicine

## 2020-02-15 ENCOUNTER — Telehealth: Payer: Self-pay | Admitting: Internal Medicine

## 2020-02-15 ENCOUNTER — Encounter: Payer: Self-pay | Admitting: Internal Medicine

## 2020-02-15 DIAGNOSIS — N949 Unspecified condition associated with female genital organs and menstrual cycle: Secondary | ICD-10-CM | POA: Insufficient documentation

## 2020-02-15 NOTE — Assessment & Plan Note (Signed)
Blood pressure has been under control on losartan and amlodipine.  Follow pressures.  Follow metabolic panel.

## 2020-02-15 NOTE — Assessment & Plan Note (Signed)
On omeprazole.  No upper symptoms reported.   

## 2020-02-15 NOTE — Telephone Encounter (Signed)
My chart message sent to pt for update.   

## 2020-02-15 NOTE — Assessment & Plan Note (Signed)
Noticed some urinary frequency and bloating as outlined.  Urinalysis clear.  Hold on abx.  Vaginal burning and rectal irritation as outlined.  Nystatin cream as directed.  Discussed f/u with gyn.

## 2020-02-15 NOTE — Assessment & Plan Note (Signed)
Vaginal burning as outlined.  Also peri rectal irritation.  Trial of nystatin cream.  Discussed referral back to gyn.  Follow.

## 2020-02-15 NOTE — Assessment & Plan Note (Signed)
Has been followed by endocrinology.   

## 2020-02-21 ENCOUNTER — Other Ambulatory Visit (INDEPENDENT_AMBULATORY_CARE_PROVIDER_SITE_OTHER): Payer: Self-pay | Admitting: Vascular Surgery

## 2020-02-21 DIAGNOSIS — I773 Arterial fibromuscular dysplasia: Secondary | ICD-10-CM

## 2020-02-24 ENCOUNTER — Ambulatory Visit (INDEPENDENT_AMBULATORY_CARE_PROVIDER_SITE_OTHER): Payer: Medicare Other

## 2020-02-24 ENCOUNTER — Encounter (INDEPENDENT_AMBULATORY_CARE_PROVIDER_SITE_OTHER): Payer: Self-pay | Admitting: Vascular Surgery

## 2020-02-24 ENCOUNTER — Other Ambulatory Visit: Payer: Self-pay

## 2020-02-24 ENCOUNTER — Ambulatory Visit (INDEPENDENT_AMBULATORY_CARE_PROVIDER_SITE_OTHER): Payer: Medicare Other | Admitting: Vascular Surgery

## 2020-02-24 VITALS — BP 134/76 | HR 67 | Ht 65.0 in | Wt 145.0 lb

## 2020-02-24 DIAGNOSIS — E782 Mixed hyperlipidemia: Secondary | ICD-10-CM | POA: Diagnosis not present

## 2020-02-24 DIAGNOSIS — K219 Gastro-esophageal reflux disease without esophagitis: Secondary | ICD-10-CM | POA: Diagnosis not present

## 2020-02-24 DIAGNOSIS — I701 Atherosclerosis of renal artery: Secondary | ICD-10-CM

## 2020-02-24 DIAGNOSIS — I6523 Occlusion and stenosis of bilateral carotid arteries: Secondary | ICD-10-CM | POA: Diagnosis not present

## 2020-02-24 DIAGNOSIS — I10 Essential (primary) hypertension: Secondary | ICD-10-CM

## 2020-02-24 DIAGNOSIS — I773 Arterial fibromuscular dysplasia: Secondary | ICD-10-CM

## 2020-02-24 DIAGNOSIS — E785 Hyperlipidemia, unspecified: Secondary | ICD-10-CM | POA: Insufficient documentation

## 2020-02-24 NOTE — Progress Notes (Signed)
MRN : [id number]  Victoria Moss is a 69 y.o. (Feb 03, 1951) female who presents with chief complaint of No chief complaint on file. Marland Kitchen  History of Present Illness:  The patient is seen for follow up evaluation of carotid stenosis. The carotid stenosis followed by ultrasound.   The patient denies amaurosis fugax. There is no recent history of TIA symptoms or focal motor deficits. There is no prior documented CVA.  She is also here for follow u regarding renal artery stenosis.  No abrupt changes in her BP control over the past year.  The patient is taking enteric-coated aspirin 81 mg daily.  There is no history of migraine headaches. There is no history of seizures.  The patient has a history of coronary artery disease, no recent episodes of angina or shortness of breath. The patient denies PAD or claudication symptoms. There is a history of hyperlipidemia which is being treated with a statin.   Duplex ultrasound of the renal arteries demonstrates widely patent renal arteries bilaterally with uniform flow.  No evidence of stenosis no evidence of fibromuscular dysplasia.  Duplex ultrasound carotid arteries demonstrates widely patent carotid arteries bilaterally.  Previous mild stenosis is not seen.  No evidence of fibromuscular dysplasia.  No outpatient medications have been marked as taking for the 02/24/20 encounter (Appointment) with Delana Meyer, Dolores Lory, MD.    Past Medical History:  Diagnosis Date  . Allergy   . Arthritis   . Basal cell cancer   . Diverticulosis    H/O  . GERD (gastroesophageal reflux disease)   . Heart murmur   . Hypertension   . IBS (irritable bowel syndrome)   . Migraine    H/O, none since stopping chocolate  . Osteoporosis   . Rheumatic fever    H/O  . Status post dilation of esophageal narrowing     Past Surgical History:  Procedure Laterality Date  . APPENDECTOMY  1982  . BREAST BIOPSY Right 05/16   axillary node- neg  .  CATARACT EXTRACTION, BILATERAL    . CESAREAN SECTION    . COLONOSCOPY  2018  . DILATION AND CURETTAGE OF UTERUS     x2  . DILATION AND CURETTAGE, DIAGNOSTIC / THERAPEUTIC     x 2  . fatty tumor     on back x2  . HERNIA REPAIR    . parathyroid gland one removed    . PARATHYROIDECTOMY    . SKIN CANCER EXCISION     [facility]   . TOE SURGERY Left    joint replacement bit toe  . TUBAL LIGATION    . UMBILICAL HERNIA REPAIR    . UPPER GI ENDOSCOPY      Social History Social History   Tobacco Use  . Smoking status: Former Smoker    Years: 7.00    Quit date: 05/09/1973    Years since quitting: 46.8  . Smokeless tobacco: Never Used  . Tobacco comment: Quit at age 14  Vaping Use  . Vaping Use: Never used  Substance Use Topics  . Alcohol use: No    Alcohol/week: 0.0 standard drinks  . Drug use: No    Family History Family History  Problem Relation Age of Onset  . Breast cancer Mother 21  . Hypertension Mother   . Stroke Mother        Late 31  . Arthritis Mother   . Colon polyps Mother   . Cancer Mother        Breast  .  Hypertension Father   . Hyperlipidemia Father   . Dementia Father   . Diabetes Neg Hx   . Colon cancer Neg Hx   . Esophageal cancer Neg Hx   . Kidney disease Neg Hx   . Gallbladder disease Neg Hx     Allergies  Allergen Reactions  . Codeine Itching  . Lac Bovis Diarrhea    Abdominal Pain  . Penicillins Itching    Did it involve swelling of the face/tongue/throat, SOB, or low BP? No Did it involve sudden or severe rash/hives, skin peeling, or any reaction on the inside of your mouth or nose? No Did you need to seek medical attention at a hospital or doctor's office? No When did it last happen?40 + years ago If all above answers are "NO", may proceed with cephalosporin use.   Marland Kitchen Propofol Nausea And Vomiting     REVIEW OF SYSTEMS (Negative unless checked)  Constitutional: [] Weight loss  [] Fever  [] Chills Cardiac: [] Chest pain   [] Chest  pressure   [] Palpitations   [] Shortness of breath when laying flat   [] Shortness of breath with exertion. Vascular:  [] Pain in legs with walking   [] Pain in legs at rest  [] History of DVT   [] Phlebitis   [] Swelling in legs   [] Varicose veins   [] Non-healing ulcers Pulmonary:   [] Uses home oxygen   [] Productive cough   [] Hemoptysis   [] Wheeze  [] COPD   [] Asthma Neurologic:  [] Dizziness   [] Seizures   [] History of stroke   [] History of TIA  [] Aphasia   [] Vissual changes   [] Weakness or numbness in arm   [] Weakness or numbness in leg Musculoskeletal:   [] Joint swelling   [] Joint pain   [] Low back pain Hematologic:  [] Easy bruising  [] Easy bleeding   [] Hypercoagulable state   [] Anemic Gastrointestinal:  [] Diarrhea   [] Vomiting  [x] Gastroesophageal reflux/heartburn   [] Difficulty swallowing. Genitourinary:  [] Chronic kidney disease   [] Difficult urination  [] Frequent urination   [] Blood in urine Skin:  [] Rashes   [] Ulcers  Psychological:  [] History of anxiety   []  History of major depression.  Physical Examination  There were no vitals filed for this visit. There is no height or weight on file to calculate BMI. Gen: WD/WN, NAD Head: [place]/AT, No temporalis wasting.  Ear/Nose/Throat: Hearing grossly intact, nares w/o erythema or drainage Eyes: PER, EOMI, sclera nonicteric.  Neck: Supple, no large masses.   Pulmonary:  Good air movement, no audible wheezing bilaterally, no use of accessory muscles.  Cardiac: RRR, no JVD Vascular:  Vessel Right Left  Radial Palpable Palpable  Gastrointestinal: Non-distended. No guarding/no peritoneal signs.  Musculoskeletal: M/S 5/5 throughout.  No deformity or atrophy.  Neurologic: CN 2-12 intact. Symmetrical.  Speech is fluent. Motor exam as listed above. Psychiatric: Judgment intact, Mood & affect appropriate for pt's clinical situation. Dermatologic: No rashes or ulcers noted.  No changes consistent with cellulitis.  CBC Lab Results  Component Value Date    WBC 5.2 08/13/2019   HGB 12.6 09/26/2019   HCT 34.3 (L) 08/13/2019   MCV 95.8 08/13/2019   PLT 222.0 08/13/2019    BMET    Component Value Date/Time   NA 139 07/01/2019 0759   K 4.2 07/01/2019 0759   CL 103 07/01/2019 0759   CO2 30 07/01/2019 0759   GLUCOSE 89 07/01/2019 0759   BUN 14 07/01/2019 0759   CREATININE 0.81 07/01/2019 0759   CALCIUM 9.7 07/01/2019 0759   CrCl cannot be calculated (Patient's most recent lab  result is older than the maximum 21 days allowed.).  COAG No results found for: INR, PROTIME  Radiology No results found.   Assessment/Plan 1. Bilateral carotid artery stenosis Recommend:  Given the patient's asymptomatic subcritical stenosis no further invasive testing or surgery at this time.  Duplex ultrasound shows <50% stenosis bilaterally which has been unchanged when compared to the previous studies.  Continue antiplatelet therapy as prescribed Continue management of CAD, HTN and Hyperlipidemia Healthy heart diet,  encouraged exercise at least 4 times per week  Given the stable <30% bilateral carotid stenosis in association with the patient's age the patient will follow up PRN.  The patient is told that if symptoms of a TIA should occur then he should go to the ER and I should be notified, as this would change the management course.  The patient voices understanding.   2. Renal artery stenosis ([facility]CC) Recommend:  Given the patient's asymptomatic subcritical stenosis of the renal arteries no further invasive testing or surgery at this time.  Duplex ultrasound shows <30% stenosis bilaterally which has been unchanged when compared to the previous studies.  Continue antiplatelet therapy as prescribed Continue management of CAD, HTN and Hyperlipidemia Healthy heart diet,  encouraged exercise at least 4 times per week  Given the stable <50% bilateral renal arteries in association with the patient's other comorbidities the patient will follow up PRN.       The patient voices understanding.   3. Primary hypertension Continue antihypertensive medications as already ordered, these medications have been reviewed and there are no changes at this time.   4. Mixed hyperlipidemia Continue statin as ordered and reviewed, no changes at this time   5. Gastroesophageal reflux disease, unspecified whether esophagitis present Continue PPI as already ordered, this medication has been reviewed and there are no changes at this time.  Avoidence of caffeine and alcohol  Moderate elevation of the head of the bed    Hortencia Pilar, MD  02/24/2020 8:36 AM

## 2020-02-26 ENCOUNTER — Encounter (INDEPENDENT_AMBULATORY_CARE_PROVIDER_SITE_OTHER): Payer: Self-pay | Admitting: Vascular Surgery

## 2020-03-01 DIAGNOSIS — Z23 Encounter for immunization: Secondary | ICD-10-CM | POA: Diagnosis not present

## 2020-03-02 DIAGNOSIS — M8949 Other hypertrophic osteoarthropathy, multiple sites: Secondary | ICD-10-CM | POA: Diagnosis not present

## 2020-03-02 DIAGNOSIS — Z79899 Other long term (current) drug therapy: Secondary | ICD-10-CM | POA: Diagnosis not present

## 2020-03-02 DIAGNOSIS — M0600 Rheumatoid arthritis without rheumatoid factor, unspecified site: Secondary | ICD-10-CM | POA: Diagnosis not present

## 2020-03-06 ENCOUNTER — Other Ambulatory Visit: Payer: Self-pay

## 2020-03-06 ENCOUNTER — Other Ambulatory Visit (INDEPENDENT_AMBULATORY_CARE_PROVIDER_SITE_OTHER): Payer: Medicare Other

## 2020-03-06 DIAGNOSIS — I10 Essential (primary) hypertension: Secondary | ICD-10-CM | POA: Diagnosis not present

## 2020-03-06 DIAGNOSIS — H16223 Keratoconjunctivitis sicca, not specified as Sjogren's, bilateral: Secondary | ICD-10-CM | POA: Diagnosis not present

## 2020-03-06 LAB — HEPATIC FUNCTION PANEL
ALT: 24 U/L (ref 0–35)
AST: 19 U/L (ref 0–37)
Albumin: 4.6 g/dL (ref 3.5–5.2)
Alkaline Phosphatase: 64 U/L (ref 39–117)
Bilirubin, Direct: 0.1 mg/dL (ref 0.0–0.3)
Total Bilirubin: 0.5 mg/dL (ref 0.2–1.2)
Total Protein: 7 g/dL (ref 6.0–8.3)

## 2020-03-06 LAB — BASIC METABOLIC PANEL
BUN: 15 mg/dL (ref 6–23)
CO2: 27 mEq/L (ref 19–32)
Calcium: 9.7 mg/dL (ref 8.4–10.5)
Chloride: 103 mEq/L (ref 96–112)
Creatinine, Ser: 0.75 mg/dL (ref 0.40–1.20)
GFR: 81 mL/min (ref >60.00)
Glucose, Bld: 84 mg/dL (ref 70–99)
Potassium: 4 mEq/L (ref 3.5–5.1)
Sodium: 139 mEq/L (ref 135–145)

## 2020-03-06 LAB — LIPID PANEL
Cholesterol: 153 mg/dL (ref 0–200)
HDL: 57.9 mg/dL (ref >39.00)
LDL Cholesterol: 67 mg/dL (ref 0–99)
NonHDL: 95.22
Total CHOL/HDL Ratio: 3
Triglycerides: 142 mg/dL (ref 0.0–149.0)
VLDL: 28.4 mg/dL (ref 0.0–40.0)

## 2020-03-10 ENCOUNTER — Encounter: Payer: Self-pay | Admitting: Internal Medicine

## 2020-03-10 ENCOUNTER — Other Ambulatory Visit: Payer: Self-pay

## 2020-03-10 ENCOUNTER — Ambulatory Visit (INDEPENDENT_AMBULATORY_CARE_PROVIDER_SITE_OTHER): Payer: Medicare Other | Admitting: Internal Medicine

## 2020-03-10 DIAGNOSIS — E782 Mixed hyperlipidemia: Secondary | ICD-10-CM

## 2020-03-10 DIAGNOSIS — E210 Primary hyperparathyroidism: Secondary | ICD-10-CM | POA: Diagnosis not present

## 2020-03-10 DIAGNOSIS — I773 Arterial fibromuscular dysplasia: Secondary | ICD-10-CM

## 2020-03-10 DIAGNOSIS — N949 Unspecified condition associated with female genital organs and menstrual cycle: Secondary | ICD-10-CM

## 2020-03-10 DIAGNOSIS — K589 Irritable bowel syndrome without diarrhea: Secondary | ICD-10-CM | POA: Diagnosis not present

## 2020-03-10 DIAGNOSIS — K219 Gastro-esophageal reflux disease without esophagitis: Secondary | ICD-10-CM

## 2020-03-10 DIAGNOSIS — I701 Atherosclerosis of renal artery: Secondary | ICD-10-CM | POA: Diagnosis not present

## 2020-03-10 DIAGNOSIS — I10 Essential (primary) hypertension: Secondary | ICD-10-CM | POA: Diagnosis not present

## 2020-03-10 DIAGNOSIS — N9489 Other specified conditions associated with female genital organs and menstrual cycle: Secondary | ICD-10-CM

## 2020-03-10 DIAGNOSIS — I6523 Occlusion and stenosis of bilateral carotid arteries: Secondary | ICD-10-CM | POA: Diagnosis not present

## 2020-03-10 NOTE — Progress Notes (Signed)
Patient ID: Victoria Moss, female   DOB: 10-27-1950, 69 y.o.   MRN: [id number]   Subjective:    Patient ID: Victoria Moss, female    DOB: 1950/05/18, 69 y.o.   MRN: [id number]  HPI This visit occurred during the SARS-CoV-2 public health emergency.  Safety protocols were in place, including screening questions prior to the visit, additional usage of staff PPE, and extensive cleaning of exam room while observing appropriate contact time as indicated for disinfecting solutions.  Patient here for a scheduled follow up.  Last visit, discussed persistent vaginal irritation.  Discussed f/u with Dr Talbert Nan.  The irritation resolved now.  Does not feel needs f/u with gyn at this time.  She is walking.  Tries to stay active.  Off trazodone.  No chest pain or sob reported.  No abdominal pain.  Bowels moving. Saw Dr Delana Meyer for f/u carotids. Stable.  Recommended f/u prn.  Seeing Dr Posey Pronto for f/u RA.  On MTX.  Stable.  Blood pressure doing well.    Past Medical History:  Diagnosis Date  . Allergy   . Arthritis   . Basal cell cancer   . Diverticulosis    H/O  . GERD (gastroesophageal reflux disease)   . Heart murmur   . Hypertension   . IBS (irritable bowel syndrome)   . Migraine    H/O, none since stopping chocolate  . Osteoporosis   . Rheumatic fever    H/O  . Status post dilation of esophageal narrowing    Past Surgical History:  Procedure Laterality Date  . APPENDECTOMY  1982  . BREAST BIOPSY Right 05/16   axillary node- neg  . CATARACT EXTRACTION, BILATERAL    . CESAREAN SECTION    . COLONOSCOPY  2018  . DILATION AND CURETTAGE OF UTERUS     x2  . DILATION AND CURETTAGE, DIAGNOSTIC / THERAPEUTIC     x 2  . fatty tumor     on back x2  . HERNIA REPAIR    . parathyroid gland one removed    . PARATHYROIDECTOMY    . SKIN CANCER EXCISION     [facility]   . TOE SURGERY Left    joint replacement bit toe  . TUBAL LIGATION    . UMBILICAL HERNIA REPAIR    . UPPER GI ENDOSCOPY      Family History  Problem Relation Age of Onset  . Breast cancer Mother 31  . Hypertension Mother   . Stroke Mother        Late 71  . Arthritis Mother   . Colon polyps Mother   . Cancer Mother        Breast  . Hypertension Father   . Hyperlipidemia Father   . Dementia Father   . Diabetes Neg Hx   . Colon cancer Neg Hx   . Esophageal cancer Neg Hx   . Kidney disease Neg Hx   . Gallbladder disease Neg Hx    Social History   Socioeconomic History  . Marital status: Widowed    Spouse name: Not on file  . Number of children: 2  . Years of education: Not on file  . Highest education level: Not on file  Occupational History  . Occupation: Retired    Fish farm manager: OTHER  Tobacco Use  . Smoking status: Former Smoker    Years: 7.00    Quit date: 05/09/1973    Years since quitting: 46.8  . Smokeless tobacco: Never Used  . Tobacco  comment: Quit at age 34  Vaping Use  . Vaping Use: Never used  Substance and Sexual Activity  . Alcohol use: No    Alcohol/week: 0.0 standard drinks  . Drug use: No  . Sexual activity: Not Currently  Other Topics Concern  . Not on file  Social History Narrative  . Not on file   Social Determinants of Health   Financial Resource Strain:   . Difficulty of Paying Living Expenses: Not on file  Food Insecurity:   . Worried About Charity fundraiser in the Last Year: Not on file  . Ran Out of Food in the Last Year: Not on file  Transportation Needs:   . Lack of Transportation (Medical): Not on file  . Lack of Transportation (Non-Medical): Not on file  Physical Activity:   . Days of Exercise per Week: Not on file  . Minutes of Exercise per Session: Not on file  Stress:   . Feeling of Stress : Not on file  Social Connections:   . Frequency of Communication with Friends and Family: Not on file  . Frequency of Social Gatherings with Friends and Family: Not on file  . Attends Religious Services: Not on file  . Active Member of Clubs or  Organizations: Not on file  . Attends Archivist Meetings: Not on file  . Marital Status: Not on file    Outpatient Encounter Medications as of 03/10/2020  Medication Sig  . amLODipine (NORVASC) 5 MG tablet TAKE ONE TABLET EVERY DAY  . CALCIUM CARB-ERGOCALCIFEROL PO Take 1 tablet by mouth daily.   . cephALEXin (KEFLEX) 500 MG capsule Take 500 mg by mouth every 8 (eight) hours.  . cyclobenzaprine (FLEXERIL) 5 MG tablet TAKE ONE TABLET AT BEDTIME AS NEEDED FORMUSCLE SPASM  . dicyclomine (BENTYL) 10 MG capsule TAKE 1 CAPSULE BY MOUTH ONCE DAILY AS NEEDED AS DIRECTED BY PHYSICIAN  . fluticasone (FLONASE) 50 MCG/ACT nasal spray Place 1 spray into both nostrils daily as needed for allergies or rhinitis.  . folic acid (FOLVITE) 1 MG tablet Take 1 mg by mouth daily.  Marland Kitchen losartan (COZAAR) 100 MG tablet TAKE ONE TABLET BY MOUTH EVERY DAY  . methotrexate (RHEUMATREX) 2.5 MG tablet Take 6  tabs (15 mg ) once a week, 12  weeks, 1 refill  . Multiple Vitamin (MULTI-VITAMINS) TABS Take 1 tablet by mouth daily.   Marland Kitchen nystatin cream (MYCOSTATIN) Apply 1 application topically 2 (two) times daily.  . Omega-3 Fatty Acids (FISH OIL) 1000 MG CAPS Take 1,000 mg by mouth daily.   Marland Kitchen omeprazole (PRILOSEC) 20 MG capsule Take 20 mg by mouth daily.   . RESTASIS 0.05 % ophthalmic emulsion   . rosuvastatin (CRESTOR) 5 MG tablet TAKE ONE TABLET 3 TIMES A WEEK  . Turmeric Curcumin 500 MG CAPS Take 500 mg by mouth daily.  . [DISCONTINUED] traZODone (DESYREL) 50 MG tablet Take 0.5-1 tablets (25-50 mg total) by mouth at bedtime as needed for sleep.   No facility-administered encounter medications on file as of 03/10/2020.    Review of Systems  Constitutional: Negative for appetite change and unexpected weight change.  HENT: Negative for congestion and sinus pressure.   Respiratory: Negative for cough, chest tightness and shortness of breath.   Cardiovascular: Negative for chest pain, palpitations and leg swelling.    Gastrointestinal: Negative for abdominal pain, diarrhea, nausea and vomiting.  Genitourinary: Negative for difficulty urinating and dysuria.  Musculoskeletal: Negative for joint swelling and myalgias.  Skin: Negative  for color change and rash.  Neurological: Negative for dizziness, light-headedness and headaches.  Psychiatric/Behavioral: Negative for agitation and dysphoric mood.       Objective:    Physical Exam Constitutional:      General: She is not in acute distress.    Appearance: Normal appearance.  HENT:     Head: Normocephalic and atraumatic.     Right Ear: External ear normal.     Left Ear: External ear normal.  Eyes:     General: No scleral icterus.       Right eye: No discharge.        Left eye: No discharge.     Conjunctiva/sclera: Conjunctivae normal.  Neck:     Thyroid: No thyromegaly.  Cardiovascular:     Rate and Rhythm: Normal rate and regular rhythm.  Pulmonary:     Effort: No respiratory distress.     Breath sounds: Normal breath sounds. No wheezing.  Abdominal:     General: Bowel sounds are normal.     Palpations: Abdomen is soft.     Tenderness: There is no abdominal tenderness.  Musculoskeletal:        General: No swelling or tenderness.     Cervical back: Neck supple. No tenderness.  Lymphadenopathy:     Cervical: No cervical adenopathy.  Skin:    Findings: No erythema or rash.  Neurological:     Mental Status: She is alert.  Psychiatric:        Mood and Affect: Mood normal.        Behavior: Behavior normal.     BP 118/70   Pulse 73   Temp 98.1 F (36.7 C) (Oral)   Resp 16   Ht 5\' 5"  (1.651 m)   Wt 149 lb (67.6 kg)   LMP 05/09/2006 (Approximate)   SpO2 98%   BMI 24.79 kg/m  Wt Readings from Last 3 Encounters:  03/10/20 149 lb (67.6 kg)  02/24/20 145 lb (65.8 kg)  01/01/20 144 lb 12.8 oz (65.7 kg)     Lab Results  Component Value Date   WBC 5.2 08/13/2019   HGB 12.6 09/26/2019   HCT 34.3 (L) 08/13/2019   PLT 222.0  08/13/2019   GLUCOSE 84 03/06/2020   CHOL 153 03/06/2020   TRIG 142.0 03/06/2020   HDL 57.90 03/06/2020   LDLDIRECT 114.0 12/17/2018   LDLCALC 67 03/06/2020   ALT 24 03/06/2020   AST 19 03/06/2020   NA 139 03/06/2020   K 4.0 03/06/2020   CL 103 03/06/2020   CREATININE 0.75 03/06/2020   BUN 15 03/06/2020   CO2 27 03/06/2020   TSH 0.79 12/17/2018   HGBA1C 5.6 12/31/2014    MR BREAST BILATERAL W WO CONTRAST INC CAD  Result Date: 01/17/2020 CLINICAL DATA:  69 year old with new onset LEFT nipple retraction and recent negative diagnostic mammography and ultrasound of the LEFT breast. Family history of breast cancer in her mother at age 69. The patient returned for repeat imaging due to motion artifact on the initial imaging on 01/08/2020 which accounts for the delay in this report. LABS:  Not applicable. EXAM: BILATERAL BREAST MRI WITH AND WITHOUT CONTRAST TECHNIQUE: Multiplanar, multisequence MR images of both breasts were obtained prior to and following the intravenous administration of 6 ml of Gadavist. Three-dimensional MR images were rendered by post-processing of the original MR data on an independent workstation. The three-dimensional MR images were interpreted, and findings are reported in the following complete MRI report for this study. Three  dimensional images were evaluated at the independent interpreting workstation using the DynaCAD thin client. COMPARISON:  No prior MRI. Multiple prior mammograms and breast ultrasounds. CT abdomen and pelvis 02/21/2019 is correlated. FINDINGS: Breast composition: d. Extreme fibroglandular tissue. Background parenchymal enhancement: Mild. Right breast: No mass or abnormal enhancement. Left breast: No mass or abnormal enhancement. The inverted LEFT nipple is noted. Lymph nodes: No pathologic lymphadenopathy. Ancillary findings: Enhancing focus which involving the ANTERIOR RIGHT MIDDLE LOBE at the level of the RIGHT hemidiaphragm, measuring approximately  1.2 x 1.1 cm, shown on the prior CT to represent conglomerate scar and/or chronic atelectasis. Benign T2 hyperintense subcentimeter cyst in the LEFT lobe of the liver as noted on the prior CT. No significant ancillary findings. IMPRESSION: 1. No MRI evidence of malignancy involving either breast. 2. No pathologic lymphadenopathy. 3. No significant ancillary findings. RECOMMENDATION: Annual BILATERAL screening mammography which is due in June, 2022. BI-RADS CATEGORY  2: Benign. Electronically Signed   By: Evangeline Dakin M.D.   On: 01/17/2020 12:25       Assessment & Plan:   Problem List Items Addressed This Visit    Vaginal burning    Resolved. Not a significant issue for her now.  Follow.        Primary hyperparathyroidism ([place])    Worked up by endocrinology.  Calcium level.        IBS (irritable bowel syndrome)    Has IBS.  Also with history of hypertension.  Request referral to nutritionist.        Relevant Orders   Amb ref to Medical Nutrition Therapy-MNT   Hypertension    Continue losartan and amlodipine.  Blood pressure doing well.  Follow pressures.  Follow metabolic panel.       Relevant Orders   Amb ref to Medical Nutrition Therapy-MNT   Hyperlipidemia    On crestor.  Low cholesterol diet and exercise.  Follow lipid panel and liver function tests.        GERD (gastroesophageal reflux disease)    No upper symptoms.  On omeprazole.  Follow.       Fibromuscular dysplasia ([place])    Just evaluated by [facility].  Stable.  Recommended f/u prn.       Carotid stenosis    Evaluated by [facility].  Stable.  Recommended f/u prn.           Einar Pheasant, MD

## 2020-03-15 ENCOUNTER — Encounter: Payer: Self-pay | Admitting: Internal Medicine

## 2020-03-15 NOTE — Assessment & Plan Note (Signed)
Worked up by endocrinology.  Calcium level.

## 2020-03-15 NOTE — Assessment & Plan Note (Signed)
Evaluated by [facility].  Stable.  Recommended f/u prn.  

## 2020-03-15 NOTE — Assessment & Plan Note (Signed)
Just evaluated by [facility].  Stable.  Recommended f/u prn.

## 2020-03-15 NOTE — Assessment & Plan Note (Signed)
Continue losartan and amlodipine.  Blood pressure doing well.  Follow pressures.  Follow metabolic panel.  

## 2020-03-15 NOTE — Assessment & Plan Note (Signed)
Resolved. Not a significant issue for her now.  Follow.

## 2020-03-15 NOTE — Assessment & Plan Note (Signed)
Has IBS.  Also with history of hypertension.  Request referral to nutritionist.

## 2020-03-15 NOTE — Assessment & Plan Note (Signed)
No upper symptoms.  On omeprazole.  Follow.

## 2020-03-15 NOTE — Assessment & Plan Note (Signed)
On crestor.  Low cholesterol diet and exercise.  Follow lipid panel and liver function tests.   

## 2020-03-16 ENCOUNTER — Other Ambulatory Visit: Payer: Medicare Other

## 2020-03-18 ENCOUNTER — Other Ambulatory Visit: Payer: Medicare Other

## 2020-03-20 ENCOUNTER — Ambulatory Visit: Admit: 2020-03-20 | Payer: Medicare Other | Admitting: General Surgery

## 2020-03-20 SURGERY — EXCISION LIPOMA
Anesthesia: General | Laterality: Left

## 2020-04-20 DIAGNOSIS — H04123 Dry eye syndrome of bilateral lacrimal glands: Secondary | ICD-10-CM | POA: Diagnosis not present

## 2020-04-20 DIAGNOSIS — H16223 Keratoconjunctivitis sicca, not specified as Sjogren's, bilateral: Secondary | ICD-10-CM | POA: Diagnosis not present

## 2020-04-24 ENCOUNTER — Ambulatory Visit (INDEPENDENT_AMBULATORY_CARE_PROVIDER_SITE_OTHER): Payer: Medicare Other | Admitting: Obstetrics and Gynecology

## 2020-04-24 ENCOUNTER — Encounter: Payer: Self-pay | Admitting: Obstetrics and Gynecology

## 2020-04-24 ENCOUNTER — Other Ambulatory Visit: Payer: Self-pay

## 2020-04-24 ENCOUNTER — Telehealth: Payer: Self-pay

## 2020-04-24 VITALS — BP 126/70 | HR 68 | Ht 65.5 in | Wt 145.0 lb

## 2020-04-24 DIAGNOSIS — I701 Atherosclerosis of renal artery: Secondary | ICD-10-CM | POA: Diagnosis not present

## 2020-04-24 DIAGNOSIS — N644 Mastodynia: Secondary | ICD-10-CM

## 2020-04-24 DIAGNOSIS — Z803 Family history of malignant neoplasm of breast: Secondary | ICD-10-CM

## 2020-04-24 DIAGNOSIS — N6459 Other signs and symptoms in breast: Secondary | ICD-10-CM | POA: Diagnosis not present

## 2020-04-24 NOTE — Progress Notes (Signed)
GYNECOLOGY  VISIT   HPI: 69 y.o.   Widowed  Caucasian  female   [phone] with Patient's last menstrual period was 05/09/2006 (approximate).   here for left breast pain for the last couple of weeks.  Pain comes and goes.   Stress with dealing with 46 year old father with health issues.  Stress causes her to have back or neck pain.  She is providing care to her father when caregivers do not come to work.  No known trauma.   Patient had a dx mammogram and left breast US 12/13/19 for left nipple inversion.  No evidence of malignancy. Category D density.  Breast MRI also done on 01/08/20, and no evidence of malignancy.   FH breast cancer.   GYNECOLOGIC HISTORY: Patient's last menstrual period was 05/09/2006 (approximate). Contraception:  PMP Menopausal hormone therapy:  none Last mammogram: 12/13/19 Left Diagnostic MM/US - BIRADS 2 benign/density d -- 01/08/20 MRI of Left Breast Last pap smear:   11/22/18 Negative         11/17/16 Negative        OB History    Gravida  3   Para  2   Term  2   Preterm      AB  1   Living  2     SAB      IAB      Ectopic      Multiple      Live Births  2        Obstetric Comments  1st Menstrual Cycle:  13 1st Pregnancy:  18           Patient Active Problem List   Diagnosis Date Noted  . Hyperlipidemia 02/24/2020  . Vaginal burning 02/15/2020  . Sleep difficulties 01/11/2020  . 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 Urinary frequency 04/21/2019  . Constipation 12/23/2018  . Lipoma of back 09/02/2017  . Long-term use of Plaquenil 06/12/2017  . PVD (posterior vitreous detachment), bilateral 06/12/2017  . Severe myopia of both eyes 06/12/2017  . Erosive gastritis 06/07/2017  . Hypertension 10/22/2016  . Closed nondisplaced fracture of styloid process of right ulna 08/11/2016  . Ganglion cyst of both wrists 06/24/2016  . Carotid stenosis 03/21/2016  . Renal artery stenosis ([place]) 03/21/2016  . Generalized anxiety disorder  12/17/2015  . Neck pain 11/27/2015  . Primary hyperparathyroidism ([place]) 02/06/2015  . Fibromuscular dysplasia ([place]) 12/03/2014  . Insomnia 10/27/2014  . Health care maintenance 08/10/2014  . Family history of colonic polyps 07/02/2013  . Chronic inflammatory arthritis 12/23/2012  . IBS (irritable bowel syndrome) 12/23/2012  . Diverticulosis 12/23/2012  . GERD (gastroesophageal reflux disease) 12/23/2012  . Environmental allergies 12/23/2012  . Migraine 12/23/2012  . Osteopenia     Past Medical History:  Diagnosis Date  . Allergy   . Arthritis   . Basal cell cancer   . Diverticulosis    H/O  . GERD (gastroesophageal reflux disease)   . Heart murmur   . Hypertension   . IBS (irritable bowel syndrome)   . Migraine    H/O, none since stopping chocolate  . Osteoporosis   . Rheumatic fever    H/O  . Status post dilation of esophageal narrowing     Past Surgical History:  Procedure Laterality Date  . APPENDECTOMY  1982  . BREAST BIOPSY Right 05/16   axillary node- neg  . CATARACT EXTRACTION, BILATERAL    . CESAREAN SECTION    . COLONOSCOPY  2018  . DILATION  AND CURETTAGE OF UTERUS     x2  . DILATION AND CURETTAGE, DIAGNOSTIC / THERAPEUTIC     x 2  . fatty tumor     on back x2  . HERNIA REPAIR    . parathyroid gland one removed    . PARATHYROIDECTOMY    . SKIN CANCER EXCISION     [facility]   . TOE SURGERY Left    joint replacement bit toe  . TUBAL LIGATION    . UMBILICAL HERNIA REPAIR    . UPPER GI ENDOSCOPY      Current Outpatient Medications  Medication Sig Dispense Refill  . amLODipine (NORVASC) 5 MG tablet TAKE ONE TABLET EVERY DAY 30 tablet 5  . CALCIUM CARB-ERGOCALCIFEROL PO Take 1 tablet by mouth daily.     . cyclobenzaprine (FLEXERIL) 5 MG tablet TAKE ONE TABLET AT BEDTIME AS NEEDED FORMUSCLE SPASM 30 tablet 0  . dicyclomine (BENTYL) 10 MG capsule TAKE 1 CAPSULE BY MOUTH ONCE DAILY AS NEEDED AS DIRECTED BY PHYSICIAN 90 capsule 1  . folic acid (FOLVITE) 1  MG tablet Take 1 mg by mouth daily.    Marland Kitchen losartan (COZAAR) 100 MG tablet TAKE ONE TABLET BY MOUTH EVERY DAY 30 tablet 3  . methotrexate (RHEUMATREX) 2.5 MG tablet Take 6  tabs (15 mg ) once a week, 12  weeks, 1 refill    . Multiple Vitamin (MULTI-VITAMINS) TABS Take 1 tablet by mouth daily.     . Omega-3 Fatty Acids (FISH OIL) 1000 MG CAPS Take 1,000 mg by mouth daily.     Marland Kitchen omeprazole (PRILOSEC) 20 MG capsule Take 20 mg by mouth daily.     . RESTASIS 0.05 % ophthalmic emulsion     . rosuvastatin (CRESTOR) 5 MG tablet TAKE ONE TABLET 3 TIMES A WEEK 15 tablet 5  . Turmeric Curcumin 500 MG CAPS Take 500 mg by mouth daily.    . fluticasone (FLONASE) 50 MCG/ACT nasal spray Place 1 spray into both nostrils daily as needed for allergies or rhinitis. (Patient not taking: Reported on 04/24/2020)    . nystatin cream (MYCOSTATIN) Apply 1 application topically 2 (two) times daily. (Patient not taking: Reported on 04/24/2020) 30 g 0   No current facility-administered medications for this visit.     ALLERGIES: Codeine, Lac bovis, Penicillins, and Propofol  Family History  Problem Relation Age of Onset  . Breast cancer Mother 31  . Hypertension Mother   . Stroke Mother        Late 70  . Arthritis Mother   . Colon polyps Mother   . Cancer Mother        Breast  . Hypertension Father   . Hyperlipidemia Father   . Dementia Father   . Diabetes Neg Hx   . Colon cancer Neg Hx   . Esophageal cancer Neg Hx   . Kidney disease Neg Hx   . Gallbladder disease Neg Hx     Social History   Socioeconomic History  . Marital status: Widowed    Spouse name: Not on file  . Number of children: 2  . Years of education: Not on file  . Highest education level: Not on file  Occupational History  . Occupation: Retired    Fish farm manager: OTHER  Tobacco Use  . Smoking status: Former Smoker    Years: 7.00    Quit date: 05/09/1973    Years since quitting: 46.9  . Smokeless tobacco: Never Used  . Tobacco comment:  Quit at  age 69  Vaping Use  . Vaping Use: Never used  Substance and Sexual Activity  . Alcohol use: No    Alcohol/week: 0.0 standard drinks  . Drug use: No  . Sexual activity: Not Currently  Other Topics Concern  . Not on file  Social History Narrative  . Not on file   Social Determinants of Health   Financial Resource Strain: Not on file  Food Insecurity: Not on file  Transportation Needs: Not on file  Physical Activity: Not on file  Stress: Not on file  Social Connections: Not on file  Intimate Partner Violence: Not on file    Review of Systems  Constitutional: Negative.   HENT: Negative.   Eyes: Negative.   Respiratory: Negative.   Cardiovascular: Negative.   Gastrointestinal: Negative.   Endocrine: Negative.   Genitourinary: Negative.   Musculoskeletal: Negative.   Skin: Negative.   Allergic/Immunologic: Negative.   Neurological: Negative.   Hematological: Negative.   Psychiatric/Behavioral: Negative.     PHYSICAL EXAMINATION:    BP 126/70 (BP Location: Right Arm, Patient Position: Sitting, Cuff Size: Normal)   Pulse 68   Ht 5' 5.5" (1.664 m)   Wt 145 lb (65.8 kg)   LMP 05/09/2006 (Approximate)   BMI 23.76 kg/m     General appearance: alert, cooperative and appears stated age  Breasts: normal appearance, no masses or tenderness, No nipple retraction or dimpling, No nipple discharge or bleeding, No axillary or supraclavicular adenopathy  Chaperone was present for exam.  ASSESSMENT  Left breast pain. Left nipple inversion with negative evaluation.  FH breast cancer.  Possible muscle strain.   PLAN  Reassurance given regarding examination today and recent negative breast imaging.  I recommend she try her Flexeril and tylenol or NSAID combined with rest.  If symptoms persist, return for re-evaluation.  Fu prn.    22 min  total time was spent for this patient encounter, including preparation, face-to-face counseling with the patient, coordination  of care, and documentation of the encounter.

## 2020-04-24 NOTE — Telephone Encounter (Signed)
Patient is having left breast pain.

## 2020-04-24 NOTE — Telephone Encounter (Signed)
AEX 11/2019 with JJ H/o left nipple inverted, had breast MRI- 01/2020=Birads, 2 Benign PMP, no HRT FH of breast cancer-mother  Spoke with pt. Pt states having left breast pain that is itching at times, sharp pains intermittently, and might appear "swollen,larger" in the last 2 weeks. Pt denies fever, redness, nipple discharge, lump felt on SBE.   Pt advised to have OV for further evaluation. Pt agreeable. Pt aware Dr Talbert Nan not in office today. Pt scheduled with Dr Quincy Simmonds at 130 pm today. Pt verbalized understanding. Thankful for appt.  Encounter closed  Routing to Dr Quincy Simmonds for review

## 2020-05-06 ENCOUNTER — Other Ambulatory Visit: Payer: Self-pay | Admitting: Internal Medicine

## 2020-07-15 DIAGNOSIS — K219 Gastro-esophageal reflux disease without esophagitis: Secondary | ICD-10-CM | POA: Diagnosis not present

## 2020-07-15 DIAGNOSIS — K582 Mixed irritable bowel syndrome: Secondary | ICD-10-CM | POA: Diagnosis not present

## 2020-07-28 ENCOUNTER — Other Ambulatory Visit: Payer: Self-pay | Admitting: Internal Medicine

## 2020-07-28 DIAGNOSIS — M0600 Rheumatoid arthritis without rheumatoid factor, unspecified site: Secondary | ICD-10-CM | POA: Diagnosis not present

## 2020-07-28 DIAGNOSIS — M7711 Lateral epicondylitis, right elbow: Secondary | ICD-10-CM | POA: Diagnosis not present

## 2020-07-28 DIAGNOSIS — Z79899 Other long term (current) drug therapy: Secondary | ICD-10-CM | POA: Diagnosis not present

## 2020-07-28 DIAGNOSIS — M8949 Other hypertrophic osteoarthropathy, multiple sites: Secondary | ICD-10-CM | POA: Diagnosis not present

## 2020-07-29 ENCOUNTER — Other Ambulatory Visit: Payer: Self-pay | Admitting: Internal Medicine

## 2020-07-30 ENCOUNTER — Telehealth: Payer: Self-pay | Admitting: Internal Medicine

## 2020-07-30 NOTE — Telephone Encounter (Signed)
Error

## 2020-08-03 ENCOUNTER — Other Ambulatory Visit: Payer: Self-pay

## 2020-08-03 ENCOUNTER — Ambulatory Visit (INDEPENDENT_AMBULATORY_CARE_PROVIDER_SITE_OTHER): Payer: Medicare Other | Admitting: Family

## 2020-08-03 ENCOUNTER — Encounter: Payer: Self-pay | Admitting: Family

## 2020-08-03 DIAGNOSIS — M5441 Lumbago with sciatica, right side: Secondary | ICD-10-CM

## 2020-08-03 DIAGNOSIS — M545 Low back pain, unspecified: Secondary | ICD-10-CM | POA: Insufficient documentation

## 2020-08-03 MED ORDER — PREDNISONE 10 MG PO TABS
ORAL_TABLET | ORAL | 0 refills | Status: DC
Start: 2020-08-03 — End: 2020-09-29

## 2020-08-03 NOTE — Assessment & Plan Note (Signed)
Onset 2 weeks with some improvement. Intermittent radicular symptoms. Trial of prednisone 4 day taper with flexeril qhs prn. Heat, gentle exercises to strengthen quadriceps, core advised. She declines baseline xrays today.

## 2020-08-03 NOTE — Patient Instructions (Signed)
Heat flexeril at night if needed Trial of prednisone taper  Quadriceps exercises, plank, superman as we discussed today to strengthen supporting muscles  Let me know if you need anything at all

## 2020-08-03 NOTE — Progress Notes (Signed)
Subjective:    Patient ID: Victoria Moss, female    DOB: February 01, 1951, 70 y.o.   MRN: [id number]  CC: Victoria Moss is a 70 y.o. female who presents today for an acute visit.    HPI: Right low buttocks pain x 2 weeks. Pain radiates to right posterior knee pain and  posterior calf on occasion. Endorses intermittent numbness, tingling. No saddle anesthesia, trouble urinating. No history of cancer.  Onset of pain after she was bending down, and she felt strain in entire bilateral low back after lifting 20lb box of cat litter.  No associated  popping sounds, falls. Left side low back has resolved.   She cannot take NSAIDs.   Pain more noticeable when walking or driving and pressing pedal with right foot. Extension of right causes pain. Even with sitting, aware that pain is there  However reports pain is mild. She is able to sleep on right hip.   Walking for exercise, 10000-12000 steps per day.   No h/o orthopedic surgeries.  h/o HTN. IBS, GERD. Gastritis, inflammatory arthritis.    No h/o ckd   HISTORY:  Past Medical History:  Diagnosis Date  . Allergy   . Arthritis   . Basal cell cancer   . Diverticulosis    H/O  . GERD (gastroesophageal reflux disease)   . Heart murmur   . Hypertension   . IBS (irritable bowel syndrome)   . Migraine    H/O, none since stopping chocolate  . Osteoporosis   . Rheumatic fever    H/O  . Status post dilation of esophageal narrowing    Past Surgical History:  Procedure Laterality Date  . APPENDECTOMY  1982  . BREAST BIOPSY Right 05/16   axillary node- neg  . CATARACT EXTRACTION, BILATERAL    . CESAREAN SECTION    . COLONOSCOPY  2018  . DILATION AND CURETTAGE OF UTERUS     x2  . DILATION AND CURETTAGE, DIAGNOSTIC / THERAPEUTIC     x 2  . fatty tumor     on back x2  . HERNIA REPAIR    . parathyroid gland one removed    . PARATHYROIDECTOMY    . SKIN CANCER EXCISION     [facility]   . TOE SURGERY Left    joint replacement  bit toe  . TUBAL LIGATION    . UMBILICAL HERNIA REPAIR    . UPPER GI ENDOSCOPY     Family History  Problem Relation Age of Onset  . Breast cancer Mother 54  . Hypertension Mother   . Stroke Mother        Late 26  . Arthritis Mother   . Colon polyps Mother   . Cancer Mother        Breast  . Hypertension Father   . Hyperlipidemia Father   . Dementia Father   . Diabetes Neg Hx   . Colon cancer Neg Hx   . Esophageal cancer Neg Hx   . Kidney disease Neg Hx   . Gallbladder disease Neg Hx     Allergies: Codeine, Lac bovis, Penicillins, and Propofol Current Outpatient Medications on File Prior to Visit  Medication Sig Dispense Refill  . amLODipine (NORVASC) 5 MG tablet TAKE ONE TABLET EVERY DAY 30 tablet 5  . CALCIUM CARB-ERGOCALCIFEROL PO Take 1 tablet by mouth daily.     . cyclobenzaprine (FLEXERIL) 5 MG tablet TAKE ONE TABLET AT BEDTIME AS NEEDED FORMUSCLE SPASM 30 tablet 0  . dicyclomine (  BENTYL) 10 MG capsule TAKE 1 CAPSULE BY MOUTH ONCE DAILY AS NEEDED AS DIRECTED BY PHYSICIAN 90 capsule 1  . fluticasone (FLONASE) 50 MCG/ACT nasal spray Place 1 spray into both nostrils daily as needed for allergies or rhinitis.    . folic acid (FOLVITE) 1 MG tablet Take 1 mg by mouth daily.    Marland Kitchen losartan (COZAAR) 100 MG tablet TAKE ONE TABLET BY MOUTH EVERY DAY 30 tablet 3  . methotrexate (RHEUMATREX) 2.5 MG tablet Take 6  tabs (15 mg ) once a week, 12  weeks, 1 refill    . Multiple Vitamin (MULTI-VITAMINS) TABS Take 1 tablet by mouth daily.     . Omega-3 Fatty Acids (FISH OIL) 1000 MG CAPS Take 1,000 mg by mouth daily.     . RABEprazole (ACIPHEX) 20 MG tablet Take 1 tablet by mouth daily.    . RESTASIS 0.05 % ophthalmic emulsion     . rosuvastatin (CRESTOR) 5 MG tablet TAKE ONE TABLET 3 TIMES A WEEK 15 tablet 5  . Turmeric Curcumin 500 MG CAPS Take 500 mg by mouth daily.     No current facility-administered medications on file prior to visit.    Social History   Tobacco Use  . Smoking  status: Former Smoker    Years: 7.00    Quit date: 05/09/1973    Years since quitting: 47.2  . Smokeless tobacco: Never Used  . Tobacco comment: Quit at age 74  Vaping Use  . Vaping Use: Never used  Substance Use Topics  . Alcohol use: No    Alcohol/week: 0.0 standard drinks  . Drug use: No    Review of Systems  Constitutional: Negative for chills and fever.  Respiratory: Negative for cough.   Cardiovascular: Negative for chest pain and palpitations.  Gastrointestinal: Negative for constipation, nausea and vomiting.  Genitourinary: Negative for difficulty urinating.  Musculoskeletal: Positive for back pain. Negative for arthralgias.  Neurological: Positive for numbness. Negative for weakness.      Objective:    BP 122/80   Pulse 75   Temp 98.1 F (36.7 C)   Ht 5' 5.5" (1.664 m)   Wt 146 lb 12.8 oz (66.6 kg)   LMP 05/09/2006 (Approximate)   SpO2 99%   BMI 24.06 kg/m    Physical Exam Vitals reviewed.  Constitutional:      Appearance: She is well-developed.  Eyes:     Conjunctiva/sclera: Conjunctivae normal.  Cardiovascular:     Rate and Rhythm: Normal rate and regular rhythm.     Pulses: Normal pulses.     Heart sounds: Normal heart sounds.  Pulmonary:     Effort: Pulmonary effort is normal.     Breath sounds: Normal breath sounds. No wheezing, rhonchi or rales.  Musculoskeletal:     Lumbar back: No swelling, edema, spasms, tenderness or bony tenderness. Normal range of motion.     Right knee: Normal. No swelling. Normal range of motion.     Left knee: Normal. No swelling. Normal range of motion.     Right lower leg: No swelling. No edema.     Left lower leg: No swelling. No edema.     Comments: Full range of motion with flexion, tension, lateral side bends. No bony tenderness. No pain, numbness, tingling elicited with single leg raise bilaterally.   Right Hip: Limp noted when walking across room.  Full ROM with flexion and hip rotation in flexion.  Pain of  lateral hip with  (flexion-abduction-external rotation) test.  No pain with deep palpation of greater trochanter.  No swelling of lower extremities. No mass, swelling noted behind right posterior knee.   Skin:    General: Skin is warm and dry.  Neurological:     Mental Status: She is alert.     Sensory: No sensory deficit.     Deep Tendon Reflexes:     Reflex Scores:      Patellar reflexes are 2+ on the right side and 2+ on the left side.    Comments: Sensation and strength intact bilateral lower extremities.  Psychiatric:        Speech: Speech normal.        Behavior: Behavior normal.        Thought Content: Thought content normal.        Assessment & Plan:   Problem List Items Addressed This Visit      Other   Right low back pain    Onset 2 weeks with some improvement. Intermittent radicular symptoms. Trial of prednisone 4 day taper with flexeril qhs prn. Heat, gentle exercises to strengthen quadriceps, core advised. She declines baseline xrays today.       Relevant Medications   predniSONE (DELTASONE) 10 MG tablet        I have discontinued Aleshka Corney. Tsang's omeprazole and nystatin cream. I am also having her start on predniSONE. Additionally, I am having her maintain her Fish Oil, CALCIUM CARB-ERGOCALCIFEROL PO, Multi-Vitamins, Turmeric Curcumin, fluticasone, methotrexate, dicyclomine, folic acid, rosuvastatin, cyclobenzaprine, Restasis, amLODipine, losartan, and RABEprazole.   Meds ordered this encounter  Medications  . predniSONE (DELTASONE) 10 MG tablet    Sig: Take 40 mg by mouth on day 1, then taper 10 mg daily until gone    Dispense:  10 tablet    Refill:  0    Order Specific Question:   Supervising Provider    Answer:   Crecencio Mc [[id number]]    Return precautions given.   Risks, benefits, and alternatives of the medications and treatment plan prescribed today were discussed, and patient expressed understanding.   Education regarding symptom  management and diagnosis given to patient on AVS.  Continue to follow with Einar Pheasant, MD for routine health maintenance.   [name] and I agreed with plan.   Mable Paris, FNP

## 2020-08-04 ENCOUNTER — Ambulatory Visit (INDEPENDENT_AMBULATORY_CARE_PROVIDER_SITE_OTHER): Payer: Medicare Other | Admitting: General Surgery

## 2020-08-04 ENCOUNTER — Other Ambulatory Visit: Payer: Self-pay

## 2020-08-04 ENCOUNTER — Encounter: Payer: Self-pay | Admitting: General Surgery

## 2020-08-04 VITALS — BP 146/79 | HR 62 | Temp 97.9°F | Ht 65.0 in | Wt 148.0 lb

## 2020-08-04 DIAGNOSIS — D171 Benign lipomatous neoplasm of skin and subcutaneous tissue of trunk: Secondary | ICD-10-CM | POA: Diagnosis not present

## 2020-08-04 DIAGNOSIS — D4989 Neoplasm of unspecified behavior of other specified sites: Secondary | ICD-10-CM

## 2020-08-04 NOTE — Patient Instructions (Addendum)
Our surgery scheduler Pamala Hurry will contact you in the next 24-48 hours to schedule your surgery. Please have your blue surgery sheet available when speaking with her. Please give [place] a call if you have any questions or concerns.  Lipoma Removal  Lipoma removal is a surgical procedure to remove a lipoma, which is a noncancerous (benign) tumor that is made up of fat cells. Most lipomas are small and painless and do not require treatment. They can form in many areas of the body but are most common under the skin of the back, arms, shoulders, buttocks, and thighs. You may need lipoma removal if you have a lipoma that is large, growing, or causing discomfort. Lipoma removal may also be done for cosmetic reasons. Tell a health care provider about:  Any allergies you have.  All medicines you are taking, including vitamins, herbs, eye drops, creams, and over-the-counter medicines.  Any problems you or family members have had with anesthetic medicines.  Any blood disorders you have.  Any surgeries you have had.  Any medical conditions you have.  Whether you are pregnant or may be pregnant. What are the risks? Generally, this is a safe procedure. However, problems may occur, including:  Infection.  Bleeding.  Scarring.  Allergic reactions to medicines.  Damage to nearby structures or organs, such as damage to nerves or blood vessels near the lipoma. What happens before the procedure? Staying hydrated Follow instructions from your health care provider about hydration, which may include:  Up to 2 hours before the procedure - you may continue to drink clear liquids, such as water, clear fruit juice, black coffee, and plain tea. Eating and drinking restrictions Follow instructions from your health care provider about eating and drinking, which may include:  8 hours before the procedure - stop eating heavy meals or foods, such as meat, fried foods, or fatty foods.  6 hours before the  procedure - stop eating light meals or foods, such as toast or cereal.  6 hours before the procedure - stop drinking milk or drinks that contain milk.  2 hours before the procedure - stop drinking clear liquids. Medicines Ask your health care provider about:  Changing or stopping your regular medicines. This is especially important if you are taking diabetes medicines or blood thinners.  Taking medicines such as aspirin and ibuprofen. These medicines can thin your blood. Do not take these medicines unless your health care provider tells you to take them.  Taking over-the-counter medicines, vitamins, herbs, and supplements. General instructions  You will have a physical exam. Your health care provider will check the size of the lipoma and whether it can be moved easily.  You may have a biopsy and imaging tests, such as X-rays, a CT scan, and an MRI.  Do not use any products that contain nicotine or tobacco for at least 4 weeks before the procedure. These products include cigarettes, e-cigarettes, and chewing tobacco. If you need help quitting, ask your health care provider.  Ask your health care provider: ? How your surgery site will be marked. ? What steps will be taken to help prevent infection. These may include:  Washing skin with a germ-killing soap.  Taking antibiotic medicine.  Plan to have someone take you home from the hospital or clinic.  If you will be going home right after the procedure, plan to have someone with you for 24 hours. What happens during the procedure?  An IV will be inserted into one of your veins.  You will be given one or more of the following: ? A medicine to help you relax (sedative). ? A medicine to numb the area (local anesthetic). ? A medicine to make you fall asleep (general anesthetic). ? A medicine that is injected into an area of your body to numb everything below the injection site (regional anesthetic).  An incision will be made over  the lipoma or very near the lipoma. The incision may be made in a natural skin line or crease.  Tissues, nerves, and blood vessels near the lipoma will be moved out of the way.  The lipoma and the capsule that surrounds it will be separated from the surrounding tissues.  The lipoma will be removed.  The incision may be closed with stitches (sutures).  A bandage (dressing) will be placed over the incision. The procedure may vary among health care providers and hospitals.   What happens after the procedure?  Your blood pressure, heart rate, breathing rate, and blood oxygen level will be monitored until you leave the hospital or clinic.  If you were prescribed an antibiotic medicine, use it as told by your health care provider. Do not stop using the antibiotic even if you start to feel better.  If you were given a sedative during the procedure, it can affect you for several hours. Do not drive or operate machinery until your health care provider says that it is safe. Summary  Before the procedure, follow instructions from your health care provider about eating and drinking, and changing or stopping your regular medicines. This is especially important if you are taking diabetes medicines or blood thinners.  After the lipoma is removed, the incision may be closed with stitches (sutures) and covered with a bandage (dressing).  If you were given a sedative during the procedure, it can affect you for several hours. Do not drive or operate machinery until your health care provider says that it is safe. This information is not intended to replace advice given to you by your health care provider. Make sure you discuss any questions you have with your health care provider. Document Revised: 12/10/2018 Document Reviewed: 12/10/2018 Elsevier Patient Education  [place].

## 2020-08-04 NOTE — Progress Notes (Signed)
Patient ID: Victoria Moss, female   DOB: [birth year], 70 y.o.   MRN: [id number]  Chief Complaint  Patient presents with  . Follow-up    Lipoma; last seen in 2020    HPI Victoria Moss is a 70 y.o. female.   I originally saw her in October 2020.  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  She has had 2 prior attempts at excision, performed by a local surgeon in [place].  Both times, the lesion grew back.  When I initially saw her, she was interested in surgical removal, however she had issues that she needed to do address concerning her father's health, among others.  She is back today to discuss removal.  She says that it has remained stable in size.  It is irritating when it rubs against her bra strap.  She denies any fevers or chills.  No nausea or vomiting.  No erythema, induration, or drainage from the site.   Past Medical History:  Diagnosis Date  . Allergy   . Arthritis   . Basal cell cancer   . Diverticulosis    H/O  . GERD (gastroesophageal reflux disease)   . Heart murmur   . Hypertension   . IBS (irritable bowel syndrome)   . Migraine    H/O, none since stopping chocolate  . Osteoporosis   . Rheumatic fever    H/O  . Status post dilation of esophageal narrowing     Past Surgical History:  Procedure Laterality Date  . APPENDECTOMY  1982  . BREAST BIOPSY Right 05/16   axillary node- neg  . CATARACT EXTRACTION, BILATERAL    . CESAREAN SECTION    . COLONOSCOPY  2018  . DILATION AND CURETTAGE OF UTERUS     x2  . DILATION AND CURETTAGE, DIAGNOSTIC / THERAPEUTIC     x 2  . fatty tumor     on back x2  . HERNIA REPAIR    . parathyroid gland one removed    . PARATHYROIDECTOMY    . SKIN CANCER EXCISION     [facility]   . TOE SURGERY Left    joint replacement bit toe  . TUBAL LIGATION    . UMBILICAL HERNIA REPAIR    . UPPER GI ENDOSCOPY      Family History  Problem Relation Age of Onset  . Breast cancer  Mother 54  . Hypertension Mother   . Stroke Mother        Late 27  . Arthritis Mother   . Colon polyps Mother   . Cancer Mother        Breast  . Hypertension Father   . Hyperlipidemia Father   . Dementia Father   . COPD Father   . Diabetes Neg Hx   . Colon cancer Neg Hx   . Esophageal cancer Neg Hx   . Kidney disease Neg Hx   . Gallbladder disease Neg Hx     Social History Social History   Tobacco Use  . Smoking status: Former Smoker    Years: 7.00    Quit date: 05/09/1973    Years since quitting: 47.2  . Smokeless tobacco: Never Used  . Tobacco comment: Quit at age 73  Vaping Use  . Vaping Use: Never used  Substance Use Topics  . Alcohol use: No    Alcohol/week: 0.0 standard drinks  . Drug use: No    Allergies  Allergen  Reactions  . Codeine Itching  . Lac Bovis Diarrhea    Abdominal Pain  . Penicillins Itching    Did it involve swelling of the face/tongue/throat, SOB, or low BP? No Did it involve sudden or severe rash/hives, skin peeling, or any reaction on the inside of your mouth or nose? No Did you need to seek medical attention at a hospital or doctor's office? No When did it last happen?40 + years ago If all above answers are "NO", may proceed with cephalosporin use.   Marland Kitchen Propofol Nausea And Vomiting    Current Outpatient Medications  Medication Sig Dispense Refill  . amLODipine (NORVASC) 5 MG tablet TAKE ONE TABLET EVERY DAY 30 tablet 5  . CALCIUM CARB-ERGOCALCIFEROL PO Take 1 tablet by mouth daily.     . cyclobenzaprine (FLEXERIL) 5 MG tablet TAKE ONE TABLET AT BEDTIME AS NEEDED FORMUSCLE SPASM 30 tablet 0  . dicyclomine (BENTYL) 10 MG capsule TAKE 1 CAPSULE BY MOUTH ONCE DAILY AS NEEDED AS DIRECTED BY PHYSICIAN 90 capsule 1  . fluticasone (FLONASE) 50 MCG/ACT nasal spray Place 1 spray into both nostrils daily as needed for allergies or rhinitis.    . folic acid (FOLVITE) 1 MG tablet Take 1 mg by mouth daily.    Marland Kitchen losartan (COZAAR) 100 MG tablet  TAKE ONE TABLET BY MOUTH EVERY DAY 30 tablet 3  . methotrexate (RHEUMATREX) 2.5 MG tablet Take 6  tabs (15 mg ) once a week, 12  weeks, 1 refill    . Multiple Vitamin (MULTI-VITAMINS) TABS Take 1 tablet by mouth daily.     . Omega-3 Fatty Acids (FISH OIL) 1000 MG CAPS Take 1,000 mg by mouth daily.     . predniSONE (DELTASONE) 10 MG tablet Take 40 mg by mouth on day 1, then taper 10 mg daily until gone 10 tablet 0  . RABEprazole (ACIPHEX) 20 MG tablet Take 1 tablet by mouth daily.    . RESTASIS 0.05 % ophthalmic emulsion     . rosuvastatin (CRESTOR) 5 MG tablet TAKE ONE TABLET 3 TIMES A WEEK 15 tablet 5  . Turmeric Curcumin 500 MG CAPS Take 500 mg by mouth daily.     No current facility-administered medications for this visit.    Review of Systems Review of Systems  Skin: Positive for rash.       New itchy rash in the middle of her lower back.  Primary care provider is aware.  All other systems reviewed and are negative.   Blood pressure (!) 146/79, pulse 62, temperature 97.9 F (36.6 C), temperature source Oral, height 5\' 5"  (1.651 m), weight 148 lb (67.1 kg), last menstrual period 05/09/2006, SpO2 98 %. Body mass index is 24.63 kg/m.  Physical Exam Physical Exam Constitutional:      General: She is not in acute distress.    Appearance: Normal appearance. She is normal weight.  HENT:     Head: Normocephalic.     Nose:     Comments: Covered with a mask.    Mouth/Throat:     Comments: Covered with a mask. Eyes:     General: No scleral icterus.       Right eye: No discharge.        Left eye: No discharge.  Neck:     Comments: Scar from prior parathyroid surgery.  No palpable cervical or supraclavicular lymphadenopathy. Cardiovascular:     Rate and Rhythm: Normal rate and regular rhythm.     Pulses: Normal pulses.  Pulmonary:  Effort: Pulmonary effort is normal.     Breath sounds: Normal breath sounds.  Abdominal:     General: Bowel sounds are normal.     Palpations:  Abdomen is soft.  Genitourinary:    Comments: Deferred. Musculoskeletal:        General: No swelling or tenderness.  Skin:    General: Skin is warm and dry.          Comments: Soft tissue mass with criss-crossed scars on left mid back.  Neurological:     General: No focal deficit present.     Mental Status: She is alert and oriented to person, place, and time.  Psychiatric:        Mood and Affect: Mood normal.        Behavior: Behavior normal.     Data Reviewed Prior clinic note reviewed.  No significant changes.  I also reviewed her recent clinic note from her primary care provider's office.  She was seen there for what sounds like possible sciatica.  She also follows with rheumatology for seronegative rheumatoid arthritis and was last seen in their office on 22 March.  She only takes methotrexate and folic acid.  She was seen by gastroenterology on March 9 for irritable bowel syndrome.  She currently takes Bentyl.  Assessment 70 y/o female with a lipoma on her back that has been excised twice and recurred twice. It is irritating to her as it rubs. She would like to have it removed.  Plan I have offered her surgical resection of the mass. I discussed the risks of surgery with her, including but not limited to bleeding, infection, scarring, wound-healing issues, recurrence.  She told me she has a history of severe PONV, so I will order medications to help offset this.  We will work on getting her scheduled for surgery.    Fredirick Maudlin 08/04/2020, 10:44 AM

## 2020-08-05 ENCOUNTER — Telehealth: Payer: Self-pay | Admitting: General Surgery

## 2020-08-05 NOTE — Telephone Encounter (Signed)
Patient has been advised of Pre-Admission date/time, COVID Testing date and Surgery date.  Surgery Date: 09/02/20 Preadmission Testing Date: 08/21/20 (phone 1p-5p) Covid Testing Date: 08/31/20 in person @ 8:35 am - patient advised to go to the [facility] ([address])   Patient has been made aware to call [phone], between 1-3:00pm the day before surgery, to find out what time to arrive for surgery.

## 2020-08-07 DIAGNOSIS — B029 Zoster without complications: Secondary | ICD-10-CM | POA: Diagnosis not present

## 2020-08-07 HISTORY — DX: Zoster without complications: B02.9

## 2020-08-10 ENCOUNTER — Telehealth: Payer: Self-pay | Admitting: Internal Medicine

## 2020-08-10 NOTE — Telephone Encounter (Signed)
Patient called in wanted to let Joycelyn Schmid know that she has not gotten any better her sciatic nerve is still bothering her

## 2020-08-10 NOTE — Telephone Encounter (Signed)
Pt stated that just since fell off curb this morning & leg seemed to "give out". She is actually in quite a bit of pain & has been trying to "walk it off". I advised her go to [facility] today since they had a walk-in clinic. Pt stated that she had been there about 5 years ago & would go back over there tonight or in the morning. I did tell her that walk-in I thought was the 1-7p portion of the day. Pt will call us if she needs anything from [place] or a referral placed.   Also she saw dermatology for rash & was glad that she started Valtrex ASAP. She does have the shingles, but thanks to medication & vaccines they have not been painful.

## 2020-08-10 NOTE — Telephone Encounter (Signed)
Call pt Is she willing to see orthopedic? She may require injection   Let me know

## 2020-08-10 NOTE — Telephone Encounter (Signed)
It looks like prednisone was given to patient with no improvement. We do not have any appointments again until acute on Friday? Please advise?

## 2020-08-11 ENCOUNTER — Telehealth: Payer: Self-pay | Admitting: *Deleted

## 2020-08-11 DIAGNOSIS — M545 Low back pain, unspecified: Secondary | ICD-10-CM | POA: Diagnosis not present

## 2020-08-11 DIAGNOSIS — M25561 Pain in right knee: Secondary | ICD-10-CM | POA: Diagnosis not present

## 2020-08-11 NOTE — Telephone Encounter (Signed)
Patient called and stated that she has surgery coming up on 09/02/20 with Dr Celine Ahr but she now has the shingles, she has had it for about 1.5 weeks and is on Valtrex. Patient stated that it is very mild but wanted to make sure its okay to still have surgery. Please call and advise

## 2020-08-11 NOTE — Telephone Encounter (Signed)
I called the patient and let her know we will need to put her surgery on hold for now. I will send a message to our surgery scheduler to cancel all appointment. We may look at scheduling surgery for some time around the 2nd or 3rd week of May. The patient is amendable to this.

## 2020-08-11 NOTE — Telephone Encounter (Signed)
As long as it isn't on her back where we are doing the surgery, it should be fine.

## 2020-08-11 NOTE — Telephone Encounter (Signed)
Spoke with the patient and she states that it is on her back next to the fatty tumor that is to be removed.

## 2020-08-11 NOTE — Telephone Encounter (Signed)
OK. Then we will need to defer surgery until it has cleared completely.

## 2020-08-12 NOTE — Telephone Encounter (Signed)
Noted Seen at [facility] 08/11/20

## 2020-08-13 ENCOUNTER — Other Ambulatory Visit: Payer: Self-pay | Admitting: Internal Medicine

## 2020-08-14 DIAGNOSIS — M0600 Rheumatoid arthritis without rheumatoid factor, unspecified site: Secondary | ICD-10-CM | POA: Diagnosis not present

## 2020-08-17 ENCOUNTER — Telehealth: Payer: Self-pay | Admitting: General Surgery

## 2020-08-17 NOTE — Telephone Encounter (Signed)
Patient calls to cancel her surgery for 09/02/20.  Patient states having a lot of other health issues going on at this time.  At patient's request all is cancelled.  She is wished well and will call back when wants to proceed.  Will probably be best to start over with office visit with the doctor prior to rescheduling.

## 2020-08-17 NOTE — Telephone Encounter (Signed)
Patient stated that her surgery is supposed to be canceled but everything is still showing up in mychart that she is scheduled for preadmit and surgery. Patient just wanted to follow up to make sure her surgery gets canceled. Patient is dealing with some other health issues right now and she will call back to r/s

## 2020-08-19 DIAGNOSIS — M23341 Other meniscus derangements, anterior horn of lateral meniscus, right knee: Secondary | ICD-10-CM | POA: Diagnosis not present

## 2020-08-19 DIAGNOSIS — M23631 Other spontaneous disruption of medial collateral ligament of right knee: Secondary | ICD-10-CM | POA: Diagnosis not present

## 2020-08-19 DIAGNOSIS — M7121 Synovial cyst of popliteal space [Baker], right knee: Secondary | ICD-10-CM | POA: Diagnosis not present

## 2020-08-19 DIAGNOSIS — M2241 Chondromalacia patellae, right knee: Secondary | ICD-10-CM | POA: Diagnosis not present

## 2020-08-19 DIAGNOSIS — M23321 Other meniscus derangements, posterior horn of medial meniscus, right knee: Secondary | ICD-10-CM | POA: Diagnosis not present

## 2020-08-19 DIAGNOSIS — M0600 Rheumatoid arthritis without rheumatoid factor, unspecified site: Secondary | ICD-10-CM | POA: Diagnosis not present

## 2020-08-20 ENCOUNTER — Encounter: Payer: Self-pay | Admitting: Family

## 2020-08-20 DIAGNOSIS — S83241A Other tear of medial meniscus, current injury, right knee, initial encounter: Secondary | ICD-10-CM | POA: Diagnosis not present

## 2020-08-20 DIAGNOSIS — M2241 Chondromalacia patellae, right knee: Secondary | ICD-10-CM | POA: Diagnosis not present

## 2020-08-21 ENCOUNTER — Inpatient Hospital Stay: Admission: RE | Admit: 2020-08-21 | Payer: Medicare Other | Source: Ambulatory Visit

## 2020-08-24 DIAGNOSIS — S83282A Other tear of lateral meniscus, current injury, left knee, initial encounter: Secondary | ICD-10-CM | POA: Diagnosis not present

## 2020-08-24 DIAGNOSIS — S83242A Other tear of medial meniscus, current injury, left knee, initial encounter: Secondary | ICD-10-CM | POA: Diagnosis not present

## 2020-08-25 ENCOUNTER — Other Ambulatory Visit: Payer: Self-pay | Admitting: Family

## 2020-08-25 DIAGNOSIS — M25561 Pain in right knee: Secondary | ICD-10-CM

## 2020-08-27 ENCOUNTER — Telehealth: Payer: Self-pay | Admitting: Internal Medicine

## 2020-08-27 NOTE — Telephone Encounter (Signed)
Rejection Reason - Patient went elsewhere" Grayland Jack said on Aug 26, 2020 3:54 PM  "Pt is seeing an orthopedic surgeon is [place]. nothing further needed at this time " Grayland Jack said on Aug 26, 2020 3:54 PM  msg from [facility] orthopedic surgery

## 2020-08-31 ENCOUNTER — Other Ambulatory Visit: Payer: Medicare Other

## 2020-09-02 ENCOUNTER — Ambulatory Visit: Admit: 2020-09-02 | Payer: Medicare Other | Admitting: General Surgery

## 2020-09-02 DIAGNOSIS — X58XXXA Exposure to other specified factors, initial encounter: Secondary | ICD-10-CM | POA: Diagnosis not present

## 2020-09-02 DIAGNOSIS — S83271A Complex tear of lateral meniscus, current injury, right knee, initial encounter: Secondary | ICD-10-CM | POA: Diagnosis not present

## 2020-09-02 DIAGNOSIS — S83281A Other tear of lateral meniscus, current injury, right knee, initial encounter: Secondary | ICD-10-CM | POA: Diagnosis not present

## 2020-09-02 DIAGNOSIS — M1711 Unilateral primary osteoarthritis, right knee: Secondary | ICD-10-CM | POA: Diagnosis not present

## 2020-09-02 DIAGNOSIS — M6751 Plica syndrome, right knee: Secondary | ICD-10-CM | POA: Diagnosis not present

## 2020-09-02 DIAGNOSIS — M948X6 Other specified disorders of cartilage, lower leg: Secondary | ICD-10-CM | POA: Diagnosis not present

## 2020-09-02 DIAGNOSIS — S83231A Complex tear of medial meniscus, current injury, right knee, initial encounter: Secondary | ICD-10-CM | POA: Diagnosis not present

## 2020-09-02 DIAGNOSIS — S83241A Other tear of medial meniscus, current injury, right knee, initial encounter: Secondary | ICD-10-CM | POA: Diagnosis not present

## 2020-09-02 DIAGNOSIS — Y999 Unspecified external cause status: Secondary | ICD-10-CM | POA: Diagnosis not present

## 2020-09-02 DIAGNOSIS — G8918 Other acute postprocedural pain: Secondary | ICD-10-CM | POA: Diagnosis not present

## 2020-09-02 SURGERY — EXCISION LIPOMA
Anesthesia: General | Laterality: Left

## 2020-09-07 ENCOUNTER — Ambulatory Visit: Payer: Medicare Other | Admitting: Internal Medicine

## 2020-09-07 DIAGNOSIS — M25461 Effusion, right knee: Secondary | ICD-10-CM | POA: Diagnosis not present

## 2020-09-07 DIAGNOSIS — R262 Difficulty in walking, not elsewhere classified: Secondary | ICD-10-CM | POA: Diagnosis not present

## 2020-09-07 DIAGNOSIS — M25561 Pain in right knee: Secondary | ICD-10-CM | POA: Diagnosis not present

## 2020-09-07 DIAGNOSIS — M6281 Muscle weakness (generalized): Secondary | ICD-10-CM | POA: Diagnosis not present

## 2020-09-08 DIAGNOSIS — M6281 Muscle weakness (generalized): Secondary | ICD-10-CM | POA: Diagnosis not present

## 2020-09-08 DIAGNOSIS — M25561 Pain in right knee: Secondary | ICD-10-CM | POA: Diagnosis not present

## 2020-09-08 DIAGNOSIS — R262 Difficulty in walking, not elsewhere classified: Secondary | ICD-10-CM | POA: Diagnosis not present

## 2020-09-08 DIAGNOSIS — M25461 Effusion, right knee: Secondary | ICD-10-CM | POA: Diagnosis not present

## 2020-09-10 DIAGNOSIS — M25461 Effusion, right knee: Secondary | ICD-10-CM | POA: Diagnosis not present

## 2020-09-15 DIAGNOSIS — M25612 Stiffness of left shoulder, not elsewhere classified: Secondary | ICD-10-CM | POA: Diagnosis not present

## 2020-09-15 DIAGNOSIS — M6281 Muscle weakness (generalized): Secondary | ICD-10-CM | POA: Diagnosis not present

## 2020-09-16 ENCOUNTER — Other Ambulatory Visit: Payer: Self-pay | Admitting: Internal Medicine

## 2020-09-16 DIAGNOSIS — Z1231 Encounter for screening mammogram for malignant neoplasm of breast: Secondary | ICD-10-CM

## 2020-09-23 DIAGNOSIS — M25461 Effusion, right knee: Secondary | ICD-10-CM | POA: Diagnosis not present

## 2020-09-23 DIAGNOSIS — R262 Difficulty in walking, not elsewhere classified: Secondary | ICD-10-CM | POA: Diagnosis not present

## 2020-09-23 DIAGNOSIS — M25561 Pain in right knee: Secondary | ICD-10-CM | POA: Diagnosis not present

## 2020-09-23 DIAGNOSIS — M6281 Muscle weakness (generalized): Secondary | ICD-10-CM | POA: Diagnosis not present

## 2020-09-29 ENCOUNTER — Other Ambulatory Visit: Payer: Self-pay

## 2020-09-29 ENCOUNTER — Ambulatory Visit (INDEPENDENT_AMBULATORY_CARE_PROVIDER_SITE_OTHER): Payer: Medicare Other | Admitting: Internal Medicine

## 2020-09-29 VITALS — BP 114/70 | HR 73 | Temp 97.8°F | Resp 16 | Ht 65.0 in | Wt 148.0 lb

## 2020-09-29 DIAGNOSIS — K219 Gastro-esophageal reflux disease without esophagitis: Secondary | ICD-10-CM

## 2020-09-29 DIAGNOSIS — E2839 Other primary ovarian failure: Secondary | ICD-10-CM | POA: Diagnosis not present

## 2020-09-29 DIAGNOSIS — E782 Mixed hyperlipidemia: Secondary | ICD-10-CM | POA: Diagnosis not present

## 2020-09-29 DIAGNOSIS — I773 Arterial fibromuscular dysplasia: Secondary | ICD-10-CM | POA: Diagnosis not present

## 2020-09-29 DIAGNOSIS — M069 Rheumatoid arthritis, unspecified: Secondary | ICD-10-CM | POA: Diagnosis not present

## 2020-09-29 DIAGNOSIS — I10 Essential (primary) hypertension: Secondary | ICD-10-CM

## 2020-09-29 DIAGNOSIS — M810 Age-related osteoporosis without current pathological fracture: Secondary | ICD-10-CM

## 2020-09-29 DIAGNOSIS — D171 Benign lipomatous neoplasm of skin and subcutaneous tissue of trunk: Secondary | ICD-10-CM

## 2020-09-29 DIAGNOSIS — I701 Atherosclerosis of renal artery: Secondary | ICD-10-CM

## 2020-09-29 DIAGNOSIS — E210 Primary hyperparathyroidism: Secondary | ICD-10-CM

## 2020-09-29 NOTE — Patient Instructions (Signed)
Increase aciphex to two per day - take one tablet 30 minutes before breakfast and take one 30 minutes before your evening meal.

## 2020-09-29 NOTE — Progress Notes (Signed)
Patient ID: Cherryl Babin, female   DOB: 1950/10/30, 70 y.o.   MRN: [id number]   Subjective:    Patient ID: Jasiya Markie, female    DOB: 02/08/1951, 71 y.o.   MRN: [id number]  HPI This visit occurred during the SARS-CoV-2 public health emergency.  Safety protocols were in place, including screening questions prior to the visit, additional usage of staff PPE, and extensive cleaning of exam room while observing appropriate contact time as indicated for disinfecting solutions.  Patient here for a scheduled follow up.  Recently had knee surgery. Persistent pain.  Due to f/u with ortho.  Taking tylenol.  Physical therapy.  Intolerant to oxycodone and celebrex - diarrhea and cramping.  On aciphex.  Discussed - aciphex bid dosing.  No chest pain or sob reported.  No abdominal pain.  No bowel issues reported.    Past Medical History:  Diagnosis Date  . Allergy   . Arthritis   . Basal cell cancer   . Diverticulosis    H/O  . GERD (gastroesophageal reflux disease)   . Heart murmur   . Hypertension   . IBS (irritable bowel syndrome)   . Migraine    H/O, none since stopping chocolate  . Osteoporosis   . Rheumatic fever    H/O  . Status post dilation of esophageal narrowing    Past Surgical History:  Procedure Laterality Date  . APPENDECTOMY  1982  . BREAST BIOPSY Right 05/16   axillary node- neg  . CATARACT EXTRACTION, BILATERAL    . CESAREAN SECTION    . COLONOSCOPY  2018  . DILATION AND CURETTAGE OF UTERUS     x2  . DILATION AND CURETTAGE, DIAGNOSTIC / THERAPEUTIC     x 2  . fatty tumor     on back x2  . HERNIA REPAIR    . parathyroid gland one removed    . PARATHYROIDECTOMY    . SKIN CANCER EXCISION     [facility]   . TOE SURGERY Left    joint replacement bit toe  . TUBAL LIGATION    . UMBILICAL HERNIA REPAIR    . UPPER GI ENDOSCOPY     Family History  Problem Relation Age of Onset  . Breast cancer Mother 64  . Hypertension Mother   . Stroke Mother         Late 49  . Arthritis Mother   . Colon polyps Mother   . Cancer Mother        Breast  . Hypertension Father   . Hyperlipidemia Father   . Dementia Father   . COPD Father   . Diabetes Neg Hx   . Colon cancer Neg Hx   . Esophageal cancer Neg Hx   . Kidney disease Neg Hx   . Gallbladder disease Neg Hx    Social History   Socioeconomic History  . Marital status: Widowed    Spouse name: Not on file  . Number of children: 2  . Years of education: Not on file  . Highest education level: Not on file  Occupational History  . Occupation: Retired    Fish farm manager: [employer]  Tobacco Use  . Smoking status: Former Smoker    Years: 7.00    Quit date: 05/09/1973    Years since quitting: 47.4  . Smokeless tobacco: Never Used  . Tobacco comment: Quit at age 23  Vaping Use  . Vaping Use: Never used  Substance and Sexual Activity  . Alcohol use: No  Alcohol/week: 0.0 standard drinks  . Drug use: No  . Sexual activity: Not Currently  Other Topics Concern  . Not on file  Social History Narrative  . Not on file   Social Determinants of Health   Financial Resource Strain: Not on file  Food Insecurity: Not on file  Transportation Needs: Not on file  Physical Activity: Not on file  Stress: Not on file  Social Connections: Not on file    Outpatient Encounter Medications as of 09/29/2020  Medication Sig  . acetaminophen (TYLENOL) 500 MG tablet Take 1,000 mg by mouth 2 (two) times daily as needed for moderate pain or headache.  Marland Kitchen amLODipine (NORVASC) 5 MG tablet TAKE ONE TABLET EVERY DAY (Patient taking differently: Take 5 mg by mouth daily.)  . Calcium Carb-Cholecalciferol (CALCIUM 600+D) 600-800 MG-UNIT TABS Take 1 tablet by mouth daily.  . Carboxymeth-Glyc-Polysorb PF (REFRESH OPTIVE MEGA-3) 0.5-1-0.5 % SOLN Place 1 drop into both eyes 2 (two) times daily.  . cyclobenzaprine (FLEXERIL) 5 MG tablet TAKE ONE TABLET AT BEDTIME AS NEEDED FORMUSCLE SPASM (Patient taking differently: Take 5 mg by  mouth at bedtime as needed for muscle spasms.)  . dicyclomine (BENTYL) 10 MG capsule TAKE 1 CAPSULE BY MOUTH ONCE DAILY AS NEEDED AS DIRECTED BY PHYSICIAN (Patient taking differently: Take 10 mg by mouth daily as needed for spasms.)  . famotidine (PEPCID) 20 MG tablet Take 20 mg by mouth every evening.  . folic acid (FOLVITE) 1 MG tablet Take 1 mg by mouth daily.  Marland Kitchen losartan (COZAAR) 100 MG tablet TAKE ONE TABLET BY MOUTH EVERY DAY (Patient taking differently: 100 mg daily.)  . Melatonin 5 MG CAPS Take 5 mg by mouth at bedtime.  . methotrexate (RHEUMATREX) 2.5 MG tablet Take 15 mg by mouth every Friday.  . Multiple Vitamin (MULTI-VITAMINS) TABS Take 1 tablet by mouth daily.   . Omega-3 Fatty Acids (FISH OIL) 1200 MG CAPS Take 1,200 mg by mouth daily.  . RABEprazole (ACIPHEX) 20 MG tablet Take 20 mg by mouth daily.  . RESTASIS 0.05 % ophthalmic emulsion Place 1 drop into both eyes 2 (two) times daily.  . rosuvastatin (CRESTOR) 5 MG tablet TAKE ONE TABLET 3 TIMES A WEEK  . Turmeric Curcumin 500 MG CAPS Take 500 mg by mouth daily.  . valACYclovir (VALTREX) 1000 MG tablet Take 1,000 mg by mouth 3 (three) times daily. (Patient not taking: Reported on 09/29/2020)  . [DISCONTINUED] fluticasone (FLONASE) 50 MCG/ACT nasal spray Place 1 spray into both nostrils daily as needed for allergies or rhinitis. (Patient not taking: Reported on 09/29/2020)  . [DISCONTINUED] predniSONE (DELTASONE) 10 MG tablet Take 40 mg by mouth on day 1, then taper 10 mg daily until gone (Patient not taking: No sig reported)   No facility-administered encounter medications on file as of 09/29/2020.    Review of Systems  Constitutional: Negative for appetite change and unexpected weight change.  HENT: Negative for congestion and sinus pressure.   Respiratory: Negative for cough, chest tightness and shortness of breath.   Cardiovascular: Negative for chest pain, palpitations and leg swelling.  Gastrointestinal: Negative for  abdominal pain and vomiting.       Previous diarrhea and cramping as outlined    Genitourinary: Negative for difficulty urinating and dysuria.  Musculoskeletal: Negative for myalgias.       Persistent knee pain as outlined.    Skin: Negative for color change and rash.  Neurological: Negative for dizziness, light-headedness and headaches.  Psychiatric/Behavioral: Negative for agitation  and dysphoric mood.       Objective:    Physical Exam Vitals reviewed.  Constitutional:      General: She is not in acute distress.    Appearance: Normal appearance.  HENT:     Head: Normocephalic and atraumatic.     Right Ear: External ear normal.     Left Ear: External ear normal.  Eyes:     General: No scleral icterus.       Right eye: No discharge.        Left eye: No discharge.     Conjunctiva/sclera: Conjunctivae normal.  Neck:     Thyroid: No thyromegaly.  Cardiovascular:     Rate and Rhythm: Normal rate and regular rhythm.  Pulmonary:     Effort: No respiratory distress.     Breath sounds: Normal breath sounds. No wheezing.  Abdominal:     General: Bowel sounds are normal.     Palpations: Abdomen is soft.     Tenderness: There is no abdominal tenderness.  Musculoskeletal:        General: No swelling or tenderness.     Cervical back: Neck supple. No tenderness.  Lymphadenopathy:     Cervical: No cervical adenopathy.  Skin:    Findings: No erythema or rash.  Neurological:     Mental Status: She is alert.  Psychiatric:        Mood and Affect: Mood normal.        Behavior: Behavior normal.     BP 114/70   Pulse 73   Temp 97.8 F (36.6 C)   Resp 16   Ht 5\' 5"  (1.651 m)   Wt 148 lb (67.1 kg)   LMP 05/09/2006 (Approximate)   SpO2 99%   BMI 24.63 kg/m  Wt Readings from Last 3 Encounters:  09/29/20 148 lb (67.1 kg)  08/04/20 148 lb (67.1 kg)  08/03/20 146 lb 12.8 oz (66.6 kg)     Lab Results  Component Value Date   WBC 5.2 08/13/2019   HGB 12.6 09/26/2019   HCT  34.3 (L) 08/13/2019   PLT 222.0 08/13/2019   GLUCOSE 84 03/06/2020   CHOL 153 03/06/2020   TRIG 142.0 03/06/2020   HDL 57.90 03/06/2020   LDLDIRECT 114.0 12/17/2018   LDLCALC 67 03/06/2020   ALT 24 03/06/2020   AST 19 03/06/2020   NA 139 03/06/2020   K 4.0 03/06/2020   CL 103 03/06/2020   CREATININE 0.75 03/06/2020   BUN 15 03/06/2020   CO2 27 03/06/2020   TSH 0.79 12/17/2018   HGBA1C 5.6 12/31/2014    MR BREAST BILATERAL W WO CONTRAST INC CAD  Result Date: 01/17/2020 CLINICAL DATA:  70 year old with new onset LEFT nipple retraction and recent negative diagnostic mammography and ultrasound of the LEFT breast. Family history of breast cancer in her mother at age 41. The patient returned for repeat imaging due to motion artifact on the initial imaging on 01/08/2020 which accounts for the delay in this report. LABS:  Not applicable. EXAM: BILATERAL BREAST MRI WITH AND WITHOUT CONTRAST TECHNIQUE: Multiplanar, multisequence MR images of both breasts were obtained prior to and following the intravenous administration of 6 ml of Gadavist. Three-dimensional MR images were rendered by post-processing of the original MR data on an independent workstation. The three-dimensional MR images were interpreted, and findings are reported in the following complete MRI report for this study. Three dimensional images were evaluated at the independent interpreting workstation using the DynaCAD thin client. COMPARISON:  No prior MRI. Multiple prior mammograms and breast ultrasounds. CT abdomen and pelvis 02/21/2019 is correlated. FINDINGS: Breast composition: d. Extreme fibroglandular tissue. Background parenchymal enhancement: Mild. Right breast: No mass or abnormal enhancement. Left breast: No mass or abnormal enhancement. The inverted LEFT nipple is noted. Lymph nodes: No pathologic lymphadenopathy. Ancillary findings: Enhancing focus which involving the ANTERIOR RIGHT MIDDLE LOBE at the level of the RIGHT  hemidiaphragm, measuring approximately 1.2 x 1.1 cm, shown on the prior CT to represent conglomerate scar and/or chronic atelectasis. Benign T2 hyperintense subcentimeter cyst in the LEFT lobe of the liver as noted on the prior CT. No significant ancillary findings. IMPRESSION: 1. No MRI evidence of malignancy involving either breast. 2. No pathologic lymphadenopathy. 3. No significant ancillary findings. RECOMMENDATION: Annual BILATERAL screening mammography which is due in June, 2022. BI-RADS CATEGORY  2: Benign. Electronically Signed   By: Evangeline Dakin M.D.   On: 01/17/2020 12:25       Assessment & Plan:   Problem List Items Addressed This Visit    Fibromuscular dysplasia ([place])    Evaluated by [facility].  Stable.  Recommended f/u prn       GERD (gastroesophageal reflux disease)    On aciphex.  Increased bid dosing.  Follow.  Call with update.        Hyperlipidemia    Continue crestor.  Low cholesterol diet and exercise.  Follow lipid panel.       Relevant Orders   Hepatic function panel   Lipid panel   TSH   Hypertension    Continue losartan and amlodipine.  Follow pressures.  Follow metabolic panel.       Relevant Orders   CBC with Differential/Platelet   Basic metabolic panel   Lipoma of back    Had to cancel surgery.  Follow.       Osteoporosis, post-menopausal    Schedule f/u bone density.        Primary hyperparathyroidism ([place])    Worked up by endocrinology.  Follow calcium level.       Renal artery stenosis ([facility])    Has been evaluated by [facility].  Follow renal function.       Rheumatoid arthritis ([place])    Followed by rheumatology.  On MTX.  Follow.        Other Visit Diagnoses    Estrogen deficiency    -  Primary   Relevant Orders   DG Bone Density       Einar Pheasant, MD

## 2020-10-01 DIAGNOSIS — M25561 Pain in right knee: Secondary | ICD-10-CM | POA: Diagnosis not present

## 2020-10-01 DIAGNOSIS — M6281 Muscle weakness (generalized): Secondary | ICD-10-CM | POA: Diagnosis not present

## 2020-10-01 DIAGNOSIS — M25461 Effusion, right knee: Secondary | ICD-10-CM | POA: Diagnosis not present

## 2020-10-01 DIAGNOSIS — R262 Difficulty in walking, not elsewhere classified: Secondary | ICD-10-CM | POA: Diagnosis not present

## 2020-10-05 ENCOUNTER — Encounter: Payer: Self-pay | Admitting: Internal Medicine

## 2020-10-05 DIAGNOSIS — M069 Rheumatoid arthritis, unspecified: Secondary | ICD-10-CM | POA: Insufficient documentation

## 2020-10-05 NOTE — Assessment & Plan Note (Signed)
Schedule f/u bone density.

## 2020-10-05 NOTE — Assessment & Plan Note (Signed)
Continue losartan and amlodipine.  Follow pressures.  Follow metabolic panel.  

## 2020-10-05 NOTE — Assessment & Plan Note (Signed)
Worked up by endocrinology.  Follow calcium level.  

## 2020-10-05 NOTE — Assessment & Plan Note (Signed)
Continue crestor.  Low cholesterol diet and exercise.  Follow lipid panel.  

## 2020-10-05 NOTE — Assessment & Plan Note (Signed)
Followed by rheumatology.  On MTX.  Follow.

## 2020-10-05 NOTE — Assessment & Plan Note (Signed)
On aciphex.  Increased bid dosing.  Follow.  Call with update.

## 2020-10-05 NOTE — Assessment & Plan Note (Signed)
Has been evaluated by [facility].  Follow renal function.  

## 2020-10-05 NOTE — Assessment & Plan Note (Signed)
Evaluated by [facility].  Stable.  Recommended f/u prn.  

## 2020-10-05 NOTE — Assessment & Plan Note (Signed)
Had to cancel surgery.  Follow.

## 2020-10-07 DIAGNOSIS — R262 Difficulty in walking, not elsewhere classified: Secondary | ICD-10-CM | POA: Diagnosis not present

## 2020-10-07 DIAGNOSIS — M25561 Pain in right knee: Secondary | ICD-10-CM | POA: Diagnosis not present

## 2020-10-07 DIAGNOSIS — M6281 Muscle weakness (generalized): Secondary | ICD-10-CM | POA: Diagnosis not present

## 2020-10-07 DIAGNOSIS — M25461 Effusion, right knee: Secondary | ICD-10-CM | POA: Diagnosis not present

## 2020-10-07 DIAGNOSIS — U071 COVID-19: Secondary | ICD-10-CM

## 2020-10-07 HISTORY — DX: COVID-19: U07.1

## 2020-10-09 ENCOUNTER — Other Ambulatory Visit: Payer: Self-pay

## 2020-10-09 ENCOUNTER — Ambulatory Visit
Admission: RE | Admit: 2020-10-09 | Discharge: 2020-10-09 | Disposition: A | Discharge status: Home / Self Care | Payer: Medicare Other | Source: Ambulatory Visit | Attending: Internal Medicine | Admitting: Internal Medicine

## 2020-10-09 DIAGNOSIS — Z1231 Encounter for screening mammogram for malignant neoplasm of breast: Secondary | ICD-10-CM | POA: Insufficient documentation

## 2020-10-12 ENCOUNTER — Other Ambulatory Visit: Payer: Self-pay

## 2020-10-12 ENCOUNTER — Telehealth: Payer: Self-pay | Admitting: Internal Medicine

## 2020-10-12 ENCOUNTER — Ambulatory Visit
Admission: EM | Admit: 2020-10-12 | Discharge: 2020-10-12 | Disposition: A | Discharge status: Home / Self Care | Payer: Medicare Other | Attending: Emergency Medicine | Admitting: Emergency Medicine

## 2020-10-12 ENCOUNTER — Encounter: Payer: Self-pay | Admitting: Emergency Medicine

## 2020-10-12 ENCOUNTER — Encounter: Payer: Self-pay | Admitting: Internal Medicine

## 2020-10-12 DIAGNOSIS — Z79899 Other long term (current) drug therapy: Secondary | ICD-10-CM

## 2020-10-12 DIAGNOSIS — J029 Acute pharyngitis, unspecified: Secondary | ICD-10-CM

## 2020-10-12 DIAGNOSIS — R52 Pain, unspecified: Secondary | ICD-10-CM

## 2020-10-12 DIAGNOSIS — R0981 Nasal congestion: Secondary | ICD-10-CM

## 2020-10-12 DIAGNOSIS — Z20822 Contact with and (suspected) exposure to covid-19: Secondary | ICD-10-CM

## 2020-10-12 MED ORDER — NIRMATRELVIR/RITONAVIR (PAXLOVID)TABLET: 3.0000 | ORAL_TABLET | Freq: Two times a day (BID) | ORAL | 0 refills | Status: AC

## 2020-10-12 NOTE — Telephone Encounter (Signed)
Called patient to let her know that Dr Nicki Reaper is not in the office this week and in order to have the Paxlovid, she will need to be evaluated by a provider. Patient agreed to go to urgent care. Confirmed no SOB, chest pain, fever, etc. She says she feels like she has the flu. Oxygen levels are staying 97%-98%

## 2020-10-12 NOTE — ED Triage Notes (Signed)
Pt presents today with c/o of nasal congestion, sore throat (itchy) and body aches x 2 days. She took home test (positive). She is inquiring about antivirals.

## 2020-10-12 NOTE — ED Provider Notes (Signed)
Roderic Palau    CSN: [id number] Arrival date & time: 10/12/20  1018      History   Chief Complaint Chief Complaint  Patient presents with  . Generalized Body Aches  . Sore Throat  . Nasal Congestion    HPI Victoria Moss is a 70 y.o. female.   Patient presents with sore throat, body aches, nasal congestion, nonproductive cough since yesterday.  She denies fever, rash, shortness of breath, vomiting, diarrhea, or other symptoms.  She had a positive COVID test at home this morning; She requests a PCR COVID test and treatment with oral COVID medication.  Her medical history includes hypertension, GERD, diverticulosis, arthritis, anxiety.  The history is provided by the patient and medical records.    Past Medical History:  Diagnosis Date  . Allergy   . Arthritis   . Basal cell cancer   . Diverticulosis    H/O  . GERD (gastroesophageal reflux disease)   . Heart murmur   . Hypertension   . IBS (irritable bowel syndrome)   . Migraine    H/O, none since stopping chocolate  . Osteoporosis   . Rheumatic fever    H/O  . Shingles 08/2020  . Status post dilation of esophageal narrowing     Patient Active Problem List   Diagnosis Date Noted  . Rheumatoid arthritis ([place]) 10/05/2020  . Right low back pain 08/03/2020  . Hyperlipidemia 02/24/2020  . Vaginal burning 02/15/2020  . Sleep difficulties 01/11/2020  . 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 Urinary frequency 04/21/2019  . Constipation 12/23/2018  . Lipoma of back 09/02/2017  . Long-term use of Plaquenil 06/12/2017  . PVD (posterior vitreous detachment), bilateral 06/12/2017  . Severe myopia of Moss eyes 06/12/2017  . Erosive gastritis 06/07/2017  . Hypertension 10/22/2016  . Closed nondisplaced fracture of styloid process of right ulna 08/11/2016  . Ganglion cyst of Moss wrists 06/24/2016  . Carotid stenosis 03/21/2016  . Renal artery stenosis ([place]) 03/21/2016  . Generalized anxiety  disorder 12/17/2015  . Neck pain 11/27/2015  . Primary hyperparathyroidism ([place]) 02/06/2015  . Fibromuscular dysplasia ([place]) 12/03/2014  . Insomnia 10/27/2014  . Health care maintenance 08/10/2014  . Family history of colonic polyps 07/02/2013  . Chronic inflammatory arthritis 12/23/2012  . IBS (irritable bowel syndrome) 12/23/2012  . Diverticulosis 12/23/2012  . GERD (gastroesophageal reflux disease) 12/23/2012  . Environmental allergies 12/23/2012  . Migraine 12/23/2012  . Osteopenia     Past Surgical History:  Procedure Laterality Date  . APPENDECTOMY  1982  . BREAST BIOPSY Right 05/16   axillary node- neg  . CATARACT EXTRACTION, BILATERAL    . CESAREAN SECTION    . COLONOSCOPY  2018  . DILATION AND CURETTAGE OF UTERUS     x2  . DILATION AND CURETTAGE, DIAGNOSTIC / THERAPEUTIC     x 2  . fatty tumor     on back x2  . HERNIA REPAIR    . parathyroid gland one removed    . PARATHYROIDECTOMY    . SKIN CANCER EXCISION     [facility]   . TOE SURGERY Left    joint replacement bit toe  . TUBAL LIGATION    . UMBILICAL HERNIA REPAIR    . UPPER GI ENDOSCOPY      OB History    Gravida  3   Para  2   Term  2   Preterm      AB  1   Living  2  SAB      IAB      Ectopic      Multiple      Live Births  2        Obstetric Comments  1st Menstrual Cycle:  13 1st Pregnancy:  18         Home Medications    Prior to Admission medications   Medication Sig Start Date End Date Taking? Authorizing Provider  acetaminophen (TYLENOL) 500 MG tablet Take 1,000 mg by mouth 2 (two) times daily as needed for moderate pain or headache.   Yes [provider]  amLODipine (NORVASC) 5 MG tablet TAKE ONE TABLET EVERY DAY Patient taking differently: Take 5 mg by mouth daily. 07/28/20  Yes Einar Pheasant, MD  Carboxymeth-Glyc-Polysorb PF (REFRESH OPTIVE MEGA-3) 0.5-1-0.5 % SOLN Place 1 drop into Moss eyes 2 (two) times daily.   Yes [provider]   dicyclomine (BENTYL) 10 MG capsule TAKE 1 CAPSULE BY MOUTH ONCE DAILY AS NEEDED AS DIRECTED BY PHYSICIAN Patient taking differently: Take 10 mg by mouth daily as needed for spasms. 06/24/19  Yes Einar Pheasant, MD  famotidine (PEPCID) 20 MG tablet Take 20 mg by mouth every evening.   Yes [provider]  folic acid (FOLVITE) 1 MG tablet Take 1 mg by mouth daily. 10/30/19  Yes [provider]  losartan (COZAAR) 100 MG tablet TAKE ONE TABLET BY MOUTH EVERY DAY Patient taking differently: 100 mg daily. 07/29/20  Yes Einar Pheasant, MD  Melatonin 5 MG CAPS Take 5 mg by mouth at bedtime.   Yes [provider]  methotrexate (RHEUMATREX) 2.5 MG tablet Take 15 mg by mouth every Friday. 12/11/18  Yes [provider]  Multiple Vitamin (MULTI-VITAMINS) TABS Take 1 tablet by mouth daily.    Yes [provider]  nirmatrelvir/ritonavir EUA (PAXLOVID) TABS Take 3 tablets by mouth 2 (two) times daily for 5 days. Patient GFR is 83. Take nirmatrelvir (150 mg) two tablets twice daily for 5 days and ritonavir (100 mg) one tablet twice daily for 5 days. 10/12/20 10/17/20 Yes Sharion Balloon, NP  Omega-3 Fatty Acids (FISH OIL) 1200 MG CAPS Take 1,200 mg by mouth daily.   Yes [provider]  RABEprazole (ACIPHEX) 20 MG tablet Take 20 mg by mouth daily. 07/15/20 07/15/21 Yes [provider]  RESTASIS 0.05 % ophthalmic emulsion Place 1 drop into Moss eyes 2 (two) times daily. 03/09/20  Yes [provider]  rosuvastatin (CRESTOR) 5 MG tablet TAKE ONE TABLET 3 TIMES A WEEK 08/13/20  Yes Einar Pheasant, MD  Turmeric Curcumin 500 MG CAPS Take 500 mg by mouth daily.   Yes [provider]  Calcium Carb-Cholecalciferol (CALCIUM 600+D) 600-800 MG-UNIT TABS Take 1 tablet by mouth daily.    [provider]  cyclobenzaprine (FLEXERIL) 5 MG tablet TAKE ONE TABLET AT BEDTIME AS NEEDED FORMUSCLE SPASM Patient taking differently: Take 5 mg by mouth at bedtime  as needed for muscle spasms. 01/29/20   Einar Pheasant, MD    Family History Family History  Problem Relation Age of Onset  . Breast cancer Mother 13  . Hypertension Mother   . Stroke Mother        Late 11  . Arthritis Mother   . Colon polyps Mother   . Cancer Mother        Breast  . Hypertension Father   . Hyperlipidemia Father   . Dementia Father   . COPD Father   . Diabetes Neg  Hx   . Colon cancer Neg Hx   . Esophageal cancer Neg Hx   . Kidney disease Neg Hx   . Gallbladder disease Neg Hx     Social History Social History   Tobacco Use  . Smoking status: Former Smoker    Years: 7.00    Quit date: 05/09/1973    Years since quitting: 47.4  . Smokeless tobacco: Never Used  . Tobacco comment: Quit at age 61  Vaping Use  . Vaping Use: Never used  Substance Use Topics  . Alcohol use: No    Alcohol/week: 0.0 standard drinks  . Drug use: No     Allergies   Codeine, [place] bov[place], Penicillins, and Propofol   Review of Systems Review of Systems  Constitutional: Negative for chills and fever.  HENT: Positive for congestion and sore throat. Negative for ear pain.   Respiratory: Positive for cough. Negative for shortness of breath.   Cardiovascular: Negative for chest pain and palpitations.  Gastrointestinal: Negative for abdominal pain, diarrhea and vomiting.  Skin: Negative for color change and rash.  All other systems reviewed and are negative.    Physical Exam Triage Vital Signs ED Triage Vitals  Enc Vitals Group     BP      Pulse      Resp      Temp      Temp src      SpO2      Weight      Height      Head Circumference      Peak Flow      Pain Score      Pain Loc      Pain Edu?      Excl. in [place]?    No data found.  Updated Vital Signs BP 135/80 (BP Location: Left Arm)   Pulse 73   Temp 99.2 F (37.3 C) (Oral)   Resp 18   LMP 05/09/2006 (Approximate)   SpO2 93%   Visual Acuity Right Eye Distance:   Left Eye Distance:   Bilateral  Distance:    Right Eye Near:   Left Eye Near:    Bilateral Near:     Physical Exam Vitals and nursing note reviewed.  Constitutional:      General: She is not in acute distress.    Appearance: She is well-developed. She is not ill-appearing.  HENT:     Head: Normocephalic and atraumatic.     Right Ear: Tympanic membrane normal.     Left Ear: Tympanic membrane normal.     Nose: Nose normal.     Mouth/Throat:     Mouth: Mucous membranes are moist.     Pharynx: Oropharynx is clear.  Eyes:     Conjunctiva/sclera: Conjunctivae normal.  Cardiovascular:     Rate and Rhythm: Normal rate and regular rhythm.     Heart sounds: Normal heart sounds.  Pulmonary:     Effort: Pulmonary effort is normal. No respiratory distress.     Breath sounds: Normal breath sounds.  Abdominal:     Palpations: Abdomen is soft.     Tenderness: There is no abdominal tenderness.  Musculoskeletal:     Cervical back: Neck supple.  Skin:    General: Skin is warm and dry.  Neurological:     General: No focal deficit present.     Mental Status: She is alert and oriented to person, place, and time.     Gait: Gait normal.  Psychiatric:  Mood and Affect: Mood normal.        Behavior: Behavior normal.      UC Treatments / Results  Labs (all labs ordered are listed, but only abnormal results are displayed) Labs Reviewed  NOVEL CORONAVIRUS, NAA    EKG   Radiology No results found.  Procedures Procedures (including critical care time)  Medications Ordered in UC Medications - No data to display  Initial Impression / Assessment and Plan / UC Course  I have reviewed the triage vital signs and the nursing notes.  Pertinent labs & imaging results that were available during my care of the patient were reviewed by me and considered in my medical decision making (see chart for details).   Suspected COVID-19 virus, nasal congestion, sore throat, body aches, medication management.  PCR COVID  pending.  Patient had CMP on 07/15/2020 and her GFR was 83.  Based on her age and comorbidities, treating with Paxlovid.  Discussed side effects and interactions with medications with patient at length.  Each of her medications reviewed for possible interaction with Paxlovid.  Instructed her to pause the rosuvastatin while taking the Paxlovid.  Instructed her to take half of her normal dose of amlodipine while on the Paxlovid.  Instructed her to monitor her blood pressure daily and notify her PCP if it is elevated.  Instructed her to notify her doctor that she will be taking Paxlovid.  Discussed with patient that if her PCR COVID comes back negative, she should stop taking the Paxlovid.  ED precautions discussed with patient if she develops severe symptoms of COVID.  She agrees to plan of care.   Final Clinical Impressions(s) / UC Diagnoses   Final diagnoses:  Suspected COVID-19 virus infection  Nasal congestion  Sore throat  Body aches  Medication management     Discharge Instructions     Your PCR COVID test is pending.  Take the Paxlovid as directed.  While on Paxlovid, stop taking the rosuvastatin.  Take half of the normal dose of amlodipine.  Monitor your blood pressure daily.  If it is elevated above 140/90, contact your doctor.    If your COVID test comes back negative, stop taking the Paxlovid.    Go to the emergency department if you develop severe symptoms such as high fever, shortness of breath, or other concerns.    Please let your primary care provider know that you are taking Paxlovid.        ED Prescriptions    Medication Sig Dispense Auth. Provider   nirmatrelvir/ritonavir EUA (PAXLOVID) TABS Take 3 tablets by mouth 2 (two) times daily for 5 days. Patient GFR is 83. Take nirmatrelvir (150 mg) two tablets twice daily for 5 days and ritonavir (100 mg) one tablet twice daily for 5 days. 30 tablet Sharion Balloon, NP     PDMP not reviewed this encounter.   Sharion Balloon,  NP 10/12/20 1126

## 2020-10-12 NOTE — Telephone Encounter (Signed)
PT called to see if they can have a TSR test done or get some antiviral as they tested positive for covid. PT also wants to know how long they need to quarantine.

## 2020-10-12 NOTE — Discharge Instructions (Addendum)
Your PCR COVID test is pending.  Take the Paxlovid as directed.  While on Paxlovid, stop taking the rosuvastatin.  Take half of the normal dose of amlodipine.  Monitor your blood pressure daily.  If it is elevated above 140/90, contact your doctor.    If your COVID test comes back negative, stop taking the Paxlovid.    Go to the emergency department if you develop severe symptoms such as high fever, shortness of breath, or other concerns.    Please let your primary care provider know that you are taking Paxlovid.

## 2020-10-13 ENCOUNTER — Other Ambulatory Visit: Payer: Medicare Other

## 2020-10-13 LAB — NOVEL CORONAVIRUS, NAA: SARS-CoV-2, NAA: DETECTED — AB

## 2020-10-13 LAB — SARS-COV-2, NAA 2 DAY TAT

## 2020-10-16 ENCOUNTER — Ambulatory Visit: Payer: Medicare Other | Admitting: Internal Medicine

## 2020-10-16 ENCOUNTER — Telehealth: Payer: Self-pay | Admitting: Internal Medicine

## 2020-10-16 NOTE — Telephone Encounter (Signed)
Patient called and wanted to speak to [place]. Patient went urgent care on 10/12/2020 and tested positive for covid. They gave her a antiviral medication, today was her last day. She would like to know what else she can take for her cough., over the counter or prescription.Marland Kitchen

## 2020-10-16 NOTE — Telephone Encounter (Signed)
LMTCB

## 2020-10-16 NOTE — Telephone Encounter (Signed)
PT called to return missed call

## 2020-10-19 ENCOUNTER — Other Ambulatory Visit: Payer: Self-pay

## 2020-10-19 MED ORDER — BENZONATATE 100 MG PO CAPS: 100.0000 mg | ORAL_CAPSULE | Freq: Three times a day (TID) | ORAL | 0 refills | Status: DC | PRN

## 2020-10-19 NOTE — Telephone Encounter (Signed)
If doing better and does not feel needs to be seen, I would recommend flonase nasal spray 2 sprays each nostril one time per day. Do this in the evening.  Saline nasal spray - flush nose at least 2x/day.  Also, can send in rx for tessalon perles 100mg  tid prn cough #21 with no refills if needs something else for cough.  (Confirm no allergy to tessalon perles)

## 2020-10-19 NOTE — Telephone Encounter (Signed)
Pt aware of below. Confirmed no allergy and sent in tessalon perles 100 mg tid prn #21 with no refills. She will let us know if she needs anything else.

## 2020-10-19 NOTE — Telephone Encounter (Signed)
Patient has completed paxlovid and over all is feeling better but is still having cough and post nasal drip/congestion. She does not feel like the congestion has moved to her chest but feels like it is sitting in her throat. She has used mucinex DM but says it makes her feel sick, she is using vicks vapor rub at night. She has flonase but has not used. NO fever, sob, chest tightness, etc. Was wondering if there was something she could use OTC. She does not feel like she needs abx.

## 2020-10-21 DIAGNOSIS — R262 Difficulty in walking, not elsewhere classified: Secondary | ICD-10-CM | POA: Diagnosis not present

## 2020-10-21 DIAGNOSIS — M25461 Effusion, right knee: Secondary | ICD-10-CM | POA: Diagnosis not present

## 2020-10-21 DIAGNOSIS — M6281 Muscle weakness (generalized): Secondary | ICD-10-CM | POA: Diagnosis not present

## 2020-10-21 DIAGNOSIS — M25561 Pain in right knee: Secondary | ICD-10-CM | POA: Diagnosis not present

## 2020-10-22 DIAGNOSIS — M25561 Pain in right knee: Secondary | ICD-10-CM | POA: Diagnosis not present

## 2020-10-22 DIAGNOSIS — M25461 Effusion, right knee: Secondary | ICD-10-CM | POA: Diagnosis not present

## 2020-10-24 DIAGNOSIS — J209 Acute bronchitis, unspecified: Secondary | ICD-10-CM | POA: Diagnosis not present

## 2020-10-26 ENCOUNTER — Encounter: Payer: Self-pay | Admitting: Emergency Medicine

## 2020-10-26 ENCOUNTER — Ambulatory Visit
Admission: EM | Admit: 2020-10-26 | Discharge: 2020-10-26 | Disposition: A | Discharge status: Home / Self Care | Payer: Medicare Other

## 2020-10-26 ENCOUNTER — Telehealth: Payer: Self-pay | Admitting: Internal Medicine

## 2020-10-26 ENCOUNTER — Ambulatory Visit (INDEPENDENT_AMBULATORY_CARE_PROVIDER_SITE_OTHER): Payer: Medicare Other

## 2020-10-26 ENCOUNTER — Other Ambulatory Visit: Payer: Self-pay

## 2020-10-26 DIAGNOSIS — R059 Cough, unspecified: Secondary | ICD-10-CM

## 2020-10-26 DIAGNOSIS — U071 COVID-19: Secondary | ICD-10-CM

## 2020-10-26 NOTE — Telephone Encounter (Signed)
She has been on prednisone and abx for 2 days and still coughing, causing her chest to hurt. Are you ok with ordering a cx[facility] or would you prefer to see her?

## 2020-10-26 NOTE — Telephone Encounter (Signed)
Patient tested positive for covid on 10/12/2020. She went to Urgent Care on Saturday, 10/24/2020. As of 10/19/2020 she tested negative. She went to urgent care because she still had symptoms. They put her on antibiotics and prednisone on 10/24/2020. Her chest still really hurts and she is still coughing. Urgent Care did not suggest  a x-ray, she would like to know if she should have a x-ray.

## 2020-10-26 NOTE — Discharge Instructions (Addendum)
Your chest x-ray is normal.  Continue taking the doxycycline, prednisone, Tessalon as directed.    Follow up with your primary care provider if your symptoms are not improving.

## 2020-10-26 NOTE — ED Triage Notes (Signed)
Pt presents today with c/o cough and chest tightness x 1 week. She was recently dx with Covid. She called PCP who told her to come here to get CXR. She is currently on Doxycycline, Prednisone and Tessalon [place] and has visited another urgent care within past few days. Denies fever.

## 2020-10-26 NOTE — ED Provider Notes (Signed)
Roderic Palau    CSN: [id number] Arrival date & time: 10/26/20  1505      History   Chief Complaint Chief Complaint  Patient presents with   Cough    HPI Victoria Moss is a 70 y.o. female.  Patient presents with ongoing cough and chest tightness x1 week.  She was seen here on 10/12/2020; she was positive for COVID and treated with Paxlovid.  Her PCP called in [place] for her cough on 10/16/2020.  Patient states she was seen at another urgent care on 10/24/2020 for her cough; started on doxycycline and prednisone.  She contacted her doctor's office today and was instructed to come here for a chest x-ray.  She denies fever, chest pain, shortness of breath, or other symptoms.  The history is provided by the patient and medical records.   Past Medical History:  Diagnosis Date   Allergy    Arthritis    Basal cell cancer    COVID-19 10/2020   Diverticulosis    H/O   GERD (gastroesophageal reflux disease)    Heart murmur    Hypertension    IBS (irritable bowel syndrome)    Migraine    H/O, none since stopping chocolate   Osteoporosis    Rheumatic fever    H/O   Shingles 08/2020   Status post dilation of esophageal narrowing     Patient Active Problem List   Diagnosis Date Noted   Rheumatoid arthritis ([place]) 10/05/2020   Right low back pain 08/03/2020   Hyperlipidemia 02/24/2020   Vaginal burning 02/15/2020   Sleep difficulties 01/11/2020   Osteoporosis, 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   Urinary frequency 04/21/2019   Constipation 12/23/2018   Lipoma of back 09/02/2017   Long-term use of Plaquenil 06/12/2017   PVD (posterior vitreous detachment), bilateral 06/12/2017   Severe myopia of both eyes 06/12/2017   Erosive gastritis 06/07/2017   Hypertension 10/22/2016   Closed nondisplaced fracture of styloid process of right ulna 08/11/2016   Ganglion cyst of both wrists 06/24/2016   Carotid stenosis 03/21/2016   Renal artery stenosis ([facility]) 03/21/2016    Generalized anxiety disorder 12/17/2015   Neck pain 11/27/2015   Primary hyperparathyroidism ([place]) 02/06/2015   Fibromuscular dysplasia ([place]) 12/03/2014   Insomnia 10/27/2014   Health care maintenance 08/10/2014   Family history of colonic polyps 07/02/2013   Chronic inflammatory arthritis 12/23/2012   IBS (irritable bowel syndrome) 12/23/2012   Diverticulosis 12/23/2012   GERD (gastroesophageal reflux disease) 12/23/2012   Environmental allergies 12/23/2012   Migraine 12/23/2012   Osteopenia     Past Surgical History:  Procedure Laterality Date   APPENDECTOMY  1982   BREAST BIOPSY Right 05/16   axillary node- neg   CATARACT EXTRACTION, BILATERAL     CESAREAN SECTION     COLONOSCOPY  2018   DILATION AND CURETTAGE OF UTERUS     x2   DILATION AND CURETTAGE, DIAGNOSTIC / THERAPEUTIC     x 2   fatty tumor     on back x2   HERNIA REPAIR     parathyroid gland one removed     PARATHYROIDECTOMY     SKIN CANCER EXCISION     [facility]    TOE SURGERY Left    joint replacement bit toe   TUBAL LIGATION     UMBILICAL HERNIA REPAIR     UPPER GI ENDOSCOPY      OB History     Gravida  3   Para  2  Term  2   Preterm      AB  1   Living  2      SAB      IAB      Ectopic      Multiple      Live Births  2        Obstetric Comments  1st Menstrual Cycle:  13 1st Pregnancy:  18          Home Medications    Prior to Admission medications   Medication Sig Start Date End Date Taking? Authorizing Provider  acetaminophen (TYLENOL) 500 MG tablet Take 1,000 mg by mouth 2 (two) times daily as needed for moderate pain or headache.    [provider]  amLODipine (NORVASC) 5 MG tablet TAKE ONE TABLET EVERY DAY Patient taking differently: Take 5 mg by mouth daily. 07/28/20   Einar Pheasant, MD  benzonatate (TESSALON) 100 MG capsule Take 1 capsule (100 mg total) by mouth 3 (three) times daily as needed for cough. 10/19/20   Einar Pheasant, MD  Calcium  Carb-Cholecalciferol (CALCIUM 600+D) 600-800 MG-UNIT TABS Take 1 tablet by mouth daily.    [provider]  Carboxymeth-Glyc-Polysorb PF (REFRESH OPTIVE MEGA-3) 0.5-1-0.5 % SOLN Place 1 drop into both eyes 2 (two) times daily.    [provider]  cyclobenzaprine (FLEXERIL) 5 MG tablet TAKE ONE TABLET AT BEDTIME AS NEEDED FORMUSCLE SPASM Patient taking differently: Take 5 mg by mouth at bedtime as needed for muscle spasms. 01/29/20   Einar Pheasant, MD  dicyclomine (BENTYL) 10 MG capsule TAKE 1 CAPSULE BY MOUTH ONCE DAILY AS NEEDED AS DIRECTED BY PHYSICIAN Patient taking differently: Take 10 mg by mouth daily as needed for spasms. 06/24/19   Einar Pheasant, MD  doxycycline (VIBRAMYCIN) 100 MG capsule Take 100 mg by mouth 2 (two) times daily. 10/24/20   [provider]  famotidine (PEPCID) 20 MG tablet Take 20 mg by mouth every evening.    [provider]  folic acid (FOLVITE) 1 MG tablet Take 1 mg by mouth daily. 10/30/19   [provider]  losartan (COZAAR) 100 MG tablet TAKE ONE TABLET BY MOUTH EVERY DAY Patient taking differently: 100 mg daily. 07/29/20   Einar Pheasant, MD  Melatonin 5 MG CAPS Take 5 mg by mouth at bedtime.    [provider]  methotrexate (RHEUMATREX) 2.5 MG tablet Take 15 mg by mouth every Friday. 12/11/18   [provider]  Multiple Vitamin (MULTI-VITAMINS) TABS Take 1 tablet by mouth daily.     [provider]  Omega-3 Fatty Acids (FISH OIL) 1200 MG CAPS Take 1,200 mg by mouth daily.    [provider]  predniSONE (DELTASONE) 10 MG tablet Take by mouth. 10/24/20   [provider]  RABEprazole (ACIPHEX) 20 MG tablet Take 20 mg by mouth daily. 07/15/20 07/15/21  [provider]  RESTASIS 0.05 % ophthalmic emulsion Place 1 drop into both eyes 2 (two) times daily. 03/09/20   [provider]  rosuvastatin (CRESTOR) 5 MG tablet TAKE ONE TABLET 3 TIMES A WEEK 08/13/20   Einar Pheasant,  MD  Turmeric Curcumin 500 MG CAPS Take 500 mg by mouth daily.    [provider]    Family History Family History  Problem Relation Age of Onset   Breast cancer Mother 2   Hypertension Mother    Stroke Mother        Late 60   Arthritis Mother    Colon  polyps Mother    Cancer Mother        Breast   Hypertension Father    Hyperlipidemia Father    Dementia Father    COPD Father    Diabetes Neg Hx    Colon cancer Neg Hx    Esophageal cancer Neg Hx    Kidney disease Neg Hx    Gallbladder disease Neg Hx     Social History Social History   Tobacco Use   Smoking status: Former    Years: 7.00    Pack years: 0.00    Types: Cigarettes    Quit date: 05/09/1973    Years since quitting: 47.4   Smokeless tobacco: Never   Tobacco comments:    Quit at age 25  Vaping Use   Vaping Use: Never used  Substance Use Topics   Alcohol use: No    Alcohol/week: 0.0 standard drinks   Drug use: No     Allergies   Codeine, Lac bovis, Penicillins, and Propofol   Review of Systems Review of Systems  Constitutional:  Negative for chills and fever.  HENT:  Negative for ear pain and sore throat.   Respiratory:  Positive for cough and chest tightness. Negative for shortness of breath.   Cardiovascular:  Negative for chest pain and palpitations.  Gastrointestinal:  Negative for abdominal pain and vomiting.  Skin:  Negative for color change and rash.  All other systems reviewed and are negative.   Physical Exam Triage Vital Signs ED Triage Vitals [10/26/20 1514]  Enc Vitals Group     BP 128/75     Pulse Rate 77     Resp 18     Temp 98.5 F (36.9 C)     Temp Source Oral     SpO2 97 %     Weight      Height      Head Circumference      Peak Flow      Pain Score      Pain Loc      Pain Edu?      Excl. in [place]?    No data found.  Updated Vital Signs BP 128/75 (BP Location: Left Arm)   Pulse 77   Temp 98.5 F (36.9 C) (Oral)   Resp 18   LMP 05/09/2006  (Approximate)   SpO2 97%   Visual Acuity Right Eye Distance:   Left Eye Distance:   Bilateral Distance:    Right Eye Near:   Left Eye Near:    Bilateral Near:     Physical Exam Vitals and nursing note reviewed.  Constitutional:      General: She is not in acute distress.    Appearance: She is well-developed. She is not ill-appearing.  HENT:     Head: Normocephalic and atraumatic.     Mouth/Throat:     Mouth: Mucous membranes are moist.  Eyes:     Conjunctiva/sclera: Conjunctivae normal.  Cardiovascular:     Rate and Rhythm: Normal rate and regular rhythm.     Heart sounds: Normal heart sounds.  Pulmonary:     Effort: Pulmonary effort is normal. No respiratory distress.     Breath sounds: Normal breath sounds. No wheezing, rhonchi or rales.  Abdominal:     Palpations: Abdomen is soft.     Tenderness: There is no abdominal tenderness.  Musculoskeletal:     Cervical back: Neck supple.  Skin:    General: Skin is warm and dry.  Neurological:  General: No focal deficit present.     Mental Status: She is alert and oriented to person, place, and time.     Gait: Gait normal.  Psychiatric:        Mood and Affect: Mood normal.        Behavior: Behavior normal.     UC Treatments / Results  Labs (all labs ordered are listed, but only abnormal results are displayed) Labs Reviewed - No data to display  EKG   Radiology DG Chest 2 View  Result Date: 10/26/2020 CLINICAL DATA:  Cough COVID EXAM: CHEST - 2 VIEW COMPARISON:  None. FINDINGS: The heart size and mediastinal contours are within normal limits. Both lungs are clear. The visualized skeletal structures are unremarkable. IMPRESSION: No active cardiopulmonary disease. Electronically Signed   By: Donavan Foil M.D.   On: 10/26/2020 16:00    Procedures Procedures (including critical care time)  Medications Ordered in UC Medications - No data to display  Initial Impression / Assessment and Plan / UC Course  I have  reviewed the triage vital signs and the nursing notes.  Pertinent labs & imaging results that were available during my care of the patient were reviewed by me and considered in my medical decision making (see chart for details).   COVID-19, cough.  CXR negative.  Instructed patient to continue taking the doxycycline, prednisone, Tessalon Perles as directed.  Instructed her to follow-up with her PCP if her symptoms are not improving.  She agrees to plan of care.   Final Clinical Impressions(s) / UC Diagnoses   Final diagnoses:  COVID-19  Cough     Discharge Instructions      Your chest x-ray is normal.  Continue taking the doxycycline, prednisone, Tessalon as directed.    Follow up with your primary care provider if your symptoms are not improving.         ED Prescriptions   None    PDMP not reviewed this encounter.   Sharion Balloon, NP 10/26/20 [phone]

## 2020-10-26 NOTE — Telephone Encounter (Signed)
If she is having chest pain and persistent cough despite prednisone, then she needs in person evaluation to determine best treatment and further w/up.

## 2020-10-26 NOTE — Telephone Encounter (Signed)
Pt is aware and is going back to urgent care to be re-evaluated.

## 2020-10-27 ENCOUNTER — Ambulatory Visit (INDEPENDENT_AMBULATORY_CARE_PROVIDER_SITE_OTHER): Payer: Medicare Other

## 2020-10-27 VITALS — Ht 65.0 in | Wt 148.0 lb

## 2020-10-27 DIAGNOSIS — Z0000 Encounter for general adult medical examination without abnormal findings: Secondary | ICD-10-CM

## 2020-10-27 NOTE — Patient Instructions (Addendum)
Victoria Moss , Thank you for taking time to come for your Medicare Wellness Visit. I appreciate your ongoing commitment to your health goals. Please review the following plan we discussed and let me know if I can assist you in the future.   These are the goals we discussed:  Goals       Patient Stated     Increase physical activity (pt-stated)      I would like to start yoga and golfing again         This is a list of the screening recommended for you and due dates:  Health Maintenance  Topic Date Due   COVID-19 Vaccine (4 - Booster for Moderna series) 11/12/2020*   Flu Shot  12/07/2020   Mammogram  10/09/2021   Tetanus Vaccine  08/18/2024   Colon Cancer Screening  05/24/2026   DEXA scan (bone density measurement)  Completed   Hepatitis C Screening: USPSTF Recommendation to screen - Ages 69-79 yo.  Completed   Pneumonia vaccines  Completed   Zoster (Shingles) Vaccine  Completed   HPV Vaccine  Aged Out  *Topic was postponed. The date shown is not the original due date.    Advanced directives: End of life planning; Advance aging; Advanced directives discussed.  Copy of current HCPOA/Living Will requested.    Conditions/risks identified: none new  Follow up in one year for your annual wellness visit    Preventive Care 65 Years and Older, Female Preventive care refers to lifestyle choices and visits with your health care provider that can promote health and wellness. What does preventive care include? A yearly physical exam. This is also called an annual well check. Dental exams once or twice a year. Routine eye exams. Ask your health care provider how often you should have your eyes checked. Personal lifestyle choices, including: Daily care of your teeth and gums. Regular physical activity. Eating a healthy diet. Avoiding tobacco and drug use. Limiting alcohol use. Practicing safe sex. Taking low-dose aspirin every day. Taking vitamin and mineral supplements as  recommended by your health care provider. What happens during an annual well check? The services and screenings done by your health care provider during your annual well check will depend on your age, overall health, lifestyle risk factors, and family history of disease. Counseling  Your health care provider may ask you questions about your: Alcohol use. Tobacco use. Drug use. Emotional well-being. Home and relationship well-being. Sexual activity. Eating habits. History of falls. Memory and ability to understand (cognition). Work and work Statistician. Reproductive health. Screening  You may have the following tests or measurements: Height, weight, and BMI. Blood pressure. Lipid and cholesterol levels. These may be checked every 5 years, or more frequently if you are over 69 years old. Skin check. Lung cancer screening. You may have this screening every year starting at age 45 if you have a 30-pack-year history of smoking and currently smoke or have quit within the past 15 years. Fecal occult blood test (FOBT) of the stool. You may have this test every year starting at age 60. Flexible sigmoidoscopy or colonoscopy. You may have a sigmoidoscopy every 5 years or a colonoscopy every 10 years starting at age 65. Hepatitis C blood test. Hepatitis B blood test. Sexually transmitted disease (STD) testing. Diabetes screening. This is done by checking your blood sugar (glucose) after you have not eaten for a while (fasting). You may have this done every 1-3 years. Bone density scan. This is done to screen  for osteoporosis. You may have this done starting at age 36. Mammogram. This may be done every 1-2 years. Talk to your health care provider about how often you should have regular mammograms. Talk with your health care provider about your test results, treatment options, and if necessary, the need for more tests. Vaccines  Your health care provider may recommend certain vaccines, such  as: Influenza vaccine. This is recommended every year. Tetanus, diphtheria, and acellular pertussis (Tdap, Td) vaccine. You may need a Td booster every 10 years. Zoster vaccine. You may need this after age 63. Pneumococcal 13-valent conjugate (PCV13) vaccine. One dose is recommended after age 30. Pneumococcal polysaccharide (PPSV23) vaccine. One dose is recommended after age 36. Talk to your health care provider about which screenings and vaccines you need and how often you need them. This information is not intended to replace advice given to you by your health care provider. Make sure you discuss any questions you have with your health care provider. Document Released: 05/22/2015 Document Revised: 01/13/2016 Document Reviewed: 02/24/2015 Elsevier Interactive Patient Education  2017 [place] Prevention in the Home Falls can cause injuries. They can happen to people of all ages. There are many things you can do to make your home safe and to help prevent falls. What can I do on the outside of my home? Regularly fix the edges of walkways and driveways and fix any cracks. Remove anything that might make you trip as you walk through a door, such as a raised step or threshold. Trim any bushes or trees on the path to your home. Use bright outdoor lighting. Clear any walking paths of anything that might make someone trip, such as rocks or tools. Regularly check to see if handrails are loose or broken. Make sure that both sides of any steps have handrails. Any raised decks and porches should have guardrails on the edges. Have any leaves, snow, or ice cleared regularly. Use sand or salt on walking paths during winter. Clean up any spills in your garage right away. This includes oil or grease spills. What can I do in the bathroom? Use night lights. Install grab bars by the toilet and in the tub and shower. Do not use towel bars as grab bars. Use non-skid mats or decals in the tub or  shower. If you need to sit down in the shower, use a plastic, non-slip stool. Keep the floor dry. Clean up any water that spills on the floor as soon as it happens. Remove soap buildup in the tub or shower regularly. Attach bath mats securely with double-sided non-slip rug tape. Do not have throw rugs and other things on the floor that can make you trip. What can I do in the bedroom? Use night lights. Make sure that you have a light by your bed that is easy to reach. Do not use any sheets or blankets that are too big for your bed. They should not hang down onto the floor. Have a firm chair that has side arms. You can use this for support while you get dressed. Do not have throw rugs and other things on the floor that can make you trip. What can I do in the kitchen? Clean up any spills right away. Avoid walking on wet floors. Keep items that you use a lot in easy-to-reach places. If you need to reach something above you, use a strong step stool that has a grab bar. Keep electrical cords out of the way. Do  not use floor polish or wax that makes floors slippery. If you must use wax, use non-skid floor wax. Do not have throw rugs and other things on the floor that can make you trip. What can I do with my stairs? Do not leave any items on the stairs. Make sure that there are handrails on both sides of the stairs and use them. Fix handrails that are broken or loose. Make sure that handrails are as long as the stairways. Check any carpeting to make sure that it is firmly attached to the stairs. Fix any carpet that is loose or worn. Avoid having throw rugs at the top or bottom of the stairs. If you do have throw rugs, attach them to the floor with carpet tape. Make sure that you have a light switch at the top of the stairs and the bottom of the stairs. If you do not have them, ask someone to add them for you. What else can I do to help prevent falls? Wear shoes that: Do not have high heels. Have  rubber bottoms. Are comfortable and fit you well. Are closed at the toe. Do not wear sandals. If you use a stepladder: Make sure that it is fully opened. Do not climb a closed stepladder. Make sure that both sides of the stepladder are locked into place. Ask someone to hold it for you, if possible. Clearly mark and make sure that you can see: Any grab bars or handrails. First and last steps. Where the edge of each step is. Use tools that help you move around (mobility aids) if they are needed. These include: Canes. Walkers. Scooters. Crutches. Turn on the lights when you go into a dark area. Replace any light bulbs as soon as they burn out. Set up your furniture so you have a clear path. Avoid moving your furniture around. If any of your floors are uneven, fix them. If there are any pets around you, be aware of where they are. Review your medicines with your doctor. Some medicines can make you feel dizzy. This can increase your chance of falling. Ask your doctor what other things that you can do to help prevent falls. This information is not intended to replace advice given to you by your health care provider. Make sure you discuss any questions you have with your health care provider. Document Released: 02/19/2009 Document Revised: 10/01/2015 Document Reviewed: 05/30/2014 Elsevier Interactive Patient Education  2017 [employer].

## 2020-10-27 NOTE — Progress Notes (Signed)
Subjective:   Victoria Moss is a 70 y.o. female who presents for Medicare Annual (Subsequent) preventive examination.  Review of Systems    No ROS.  Medicare Wellness Virtual Visit.  Visual/audio telehealth visit, UTA vital signs.   See social history for additional risk factors.   Cardiac Risk Factors include: advanced age (>90men, >4 women)     Objective:    Today's Vitals   10/27/20 1041  Weight: 148 lb (67.1 kg)  Height: 5\' 5"  (1.651 m)   Body mass index is 24.63 kg/m.  Advanced Directives 10/27/2020 10/25/2019 10/20/2016 03/21/2016  Does Patient Have a Medical Advance Directive? Yes No No No  Type of Paramedic of [place];Living will - - -  Does patient want to make changes to medical advance directive? No - Patient declined - - -  Copy of [place] in Chart? No - copy requested - - -  Would patient like information on creating a medical advance directive? - No - Patient declined Yes (MAU/Ambulatory/Procedural Areas - Information given) -    Current Medications (verified) Outpatient Encounter Medications as of 10/27/2020  Medication Sig   acetaminophen (TYLENOL) 500 MG tablet Take 1,000 mg by mouth 2 (two) times daily as needed for moderate pain or headache.   amLODipine (NORVASC) 5 MG tablet TAKE ONE TABLET EVERY DAY (Patient taking differently: Take 5 mg by mouth daily.)   benzonatate (TESSALON) 100 MG capsule Take 1 capsule (100 mg total) by mouth 3 (three) times daily as needed for cough.   Calcium Carb-Cholecalciferol (CALCIUM 600+D) 600-800 MG-UNIT TABS Take 1 tablet by mouth daily.   Carboxymeth-Glyc-Polysorb PF (REFRESH OPTIVE MEGA-3) 0.5-1-0.5 % SOLN Place 1 drop into both eyes 2 (two) times daily.   cyclobenzaprine (FLEXERIL) 5 MG tablet TAKE ONE TABLET AT BEDTIME AS NEEDED FORMUSCLE SPASM (Patient taking differently: Take 5 mg by mouth at bedtime as needed for muscle spasms.)   dicyclomine (BENTYL) 10 MG  capsule TAKE 1 CAPSULE BY MOUTH ONCE DAILY AS NEEDED AS DIRECTED BY PHYSICIAN (Patient taking differently: Take 10 mg by mouth daily as needed for spasms.)   doxycycline (VIBRAMYCIN) 100 MG capsule Take 100 mg by mouth 2 (two) times daily.   famotidine (PEPCID) 20 MG tablet Take 20 mg by mouth every evening.   folic acid (FOLVITE) 1 MG tablet Take 1 mg by mouth daily.   losartan (COZAAR) 100 MG tablet TAKE ONE TABLET BY MOUTH EVERY DAY (Patient taking differently: 100 mg daily.)   Melatonin 5 MG CAPS Take 5 mg by mouth at bedtime.   methotrexate (RHEUMATREX) 2.5 MG tablet Take 15 mg by mouth every Friday.   Multiple Vitamin (MULTI-VITAMINS) TABS Take 1 tablet by mouth daily.    Omega-3 Fatty Acids (FISH OIL) 1200 MG CAPS Take 1,200 mg by mouth daily.   predniSONE (DELTASONE) 10 MG tablet Take by mouth.   RABEprazole (ACIPHEX) 20 MG tablet Take 20 mg by mouth daily.   RESTASIS 0.05 % ophthalmic emulsion Place 1 drop into both eyes 2 (two) times daily.   rosuvastatin (CRESTOR) 5 MG tablet TAKE ONE TABLET 3 TIMES A WEEK   Turmeric Curcumin 500 MG CAPS Take 500 mg by mouth daily.   No facility-administered encounter medications on file as of 10/27/2020.    Allergies (verified) Codeine, Lac bovis, Penicillins, and Propofol   History: Past Medical History:  Diagnosis Date   Allergy    Arthritis    Basal cell cancer  COVID-19 10/2020   Diverticulosis    H/O   GERD (gastroesophageal reflux disease)    Heart murmur    Hypertension    IBS (irritable bowel syndrome)    Migraine    H/O, none since stopping chocolate   Osteoporosis    Rheumatic fever    H/O   Shingles 08/2020   Status post dilation of esophageal narrowing    Past Surgical History:  Procedure Laterality Date   APPENDECTOMY  1982   BREAST BIOPSY Right 05/16   axillary node- neg   CATARACT EXTRACTION, BILATERAL     CESAREAN SECTION     COLONOSCOPY  2018   DILATION AND CURETTAGE OF UTERUS     x2   DILATION AND  CURETTAGE, DIAGNOSTIC / THERAPEUTIC     x 2   fatty tumor     on back x2   HERNIA REPAIR     parathyroid gland one removed     PARATHYROIDECTOMY     SKIN CANCER EXCISION     BCCA    TOE SURGERY Left    joint replacement bit toe   TUBAL LIGATION     UMBILICAL HERNIA REPAIR     UPPER GI ENDOSCOPY     Family History  Problem Relation Age of Onset   Breast cancer Mother 46   Hypertension Mother    Stroke Mother        Late 23   Arthritis Mother    Colon polyps Mother    Cancer Mother        Breast   Hypertension Father    Hyperlipidemia Father    Dementia Father    COPD Father    Congestive Heart Failure Father    Diabetes Neg Hx    Colon cancer Neg Hx    Esophageal cancer Neg Hx    Kidney disease Neg Hx    Gallbladder disease Neg Hx    Social History   Socioeconomic History   Marital status: Widowed    Spouse name: Not on file   Number of children: 2   Years of education: Not on file   Highest education level: Not on file  Occupational History   Occupation: Retired    Fish farm manager: OTHER  Tobacco Use   Smoking status: Former    Years: 7.00    Pack years: 0.00    Types: Cigarettes    Quit date: 05/09/1973    Years since quitting: 47.5   Smokeless tobacco: Never   Tobacco comments:    Quit at age 65  Vaping Use   Vaping Use: Never used  Substance and Sexual Activity   Alcohol use: No    Alcohol/week: 0.0 standard drinks   Drug use: No   Sexual activity: Not Currently  Other Topics Concern   Not on file  Social History Narrative   Not on file   Social Determinants of Health   Financial Resource Strain: Low Risk    Difficulty of Paying Living Expenses: Not hard at all  Food Insecurity: No Food Insecurity   Worried About Charity fundraiser in the Last Year: Never true   [place] in the Last Year: Never true  Transportation Needs: No Transportation Needs   Lack of Transportation (Medical): No   Lack of Transportation (Non-Medical): No  Physical  Activity: Not on file  Stress: No Stress Concern Present   Feeling of Stress : Only a little  Social Connections: Unknown   Frequency of Communication  with Friends and Family: Once a week   Frequency of Social Gatherings with Friends and Family: Not on file   Attends Religious Services: Not on Electrical engineer or Organizations: Not on file   Attends Archivist Meetings: Not on file   Marital Status: Not on file    Tobacco Counseling Counseling given: Not Answered Tobacco comments: Quit at age 42   Clinical Intake:  Pre-visit preparation completed: Yes        Diabetes: No  How often do you need to have someone help you when you read instructions, pamphlets, or other written materials from your doctor or pharmacy?: 1 - Never   Interpreter Needed?: No      Activities of Daily Living In your present state of health, do you have any difficulty performing the following activities: 10/27/2020  Hearing? N  Vision? N  Difficulty concentrating or making decisions? N  Walking or climbing stairs? Y  Comment recovering from R knee surgery  Dressing or bathing? N  Doing errands, shopping? N  Preparing Food and eating ? N  Using the Toilet? N  In the past six months, have you accidently leaked urine? N  Do you have problems with loss of bowel control? N  Managing your Medications? N  Managing your Finances? N  Housekeeping or managing your Housekeeping? N  Some recent data might be hidden    Patient Care Team: Einar Pheasant, MD as PCP - General (Internal Medicine) Einar Pheasant, MD (Internal Medicine) Bary Castilla Forest Gleason, MD (General Surgery)  Indicate any recent Medical Services you may have received from other than [facility] providers in the past year (date may be approximate).     Assessment:   This is a routine wellness examination for Dorsey.  I connected with Natilee today by telephone and verified that I am speaking with the correct person  using two identifiers. Location patient: home Location provider: work Persons participating in the virtual visit: patient, Marine scientist.    I discussed the limitations, risks, security and privacy concerns of performing an evaluation and management service by telephone and the availability of in person appointments. The patient expressed understanding and verbally consented to this telephonic visit.    Interactive audio and video telecommunications were attempted between this provider and patient, however failed, due to patient having technical difficulties OR patient did not have access to video capability.  We continued and completed visit with audio only.  Some vital signs may be absent or patient reported.   Hearing/Vision screen Hearing Screening - Comments:: Patient is able to hear conversational tones without difficulty.  No issues reported.  Vision Screening - Comments:: Wears corrective lenses  Cataract extraction, bilateral  They have seen their ophthalmologist in the last 12 months.   Dietary issues and exercise activities discussed: Current Exercise Habits: Home exercise routine, Intensity: Mild Healthy diet Good water intake   Goals Addressed               This Visit's Progress     Patient Stated     Increase physical activity (pt-stated)        I would like to start yoga and golfing again        Depression Screen PHQ 2/9 Scores 10/27/2020 10/25/2019 11/02/2018 12/06/2016 10/20/2016 08/14/2015 10/22/2014  PHQ - 2 Score 0 0 0 3 4 0 4  PHQ- 9 Score - - 0 5 10 - 10    Fall Risk Fall Risk  10/27/2020 08/04/2020  10/25/2019 02/12/2019 07/03/2018  Falls in the past year? 0 0 0 0 0  Number falls in past yr: 0 - 0 0 0  Injury with Fall? 0 - - 0 -  Risk for fall due to : - - - - -  Risk for fall due to: Comment - - - - -  Follow up Falls evaluation completed - Falls evaluation completed - Falls evaluation completed    [place]: Handrails in  use when climbing stairs?Yes Home free of loose throw rugs in walkways, pet beds, electrical cords, etc? Yes  Adequate lighting in your home to reduce risk of falls? Yes   ASSISTIVE DEVICES UTILIZED TO PREVENT FALLS: Use of a cane, walker or w/c? No   TIMED UP AND GO: Was the test performed? No .   Cognitive Function: Patient is alert and oriented x3.  Denies difficulty focusing, making decisions, memory loss.  MMSE/6CIT deferred. Normal by direct communication/observation.  MMSE - Mini Mental State Exam 10/20/2016  Orientation to time 5  Orientation to Place 5  Registration 3  Attention/ Calculation 5  Recall 3  Language- name 2 objects 2  Language- repeat 1  Language- follow 3 step command 3  Language- read & follow direction 1  Write a sentence 1  Copy design 1  Total score 30     6CIT Screen 10/25/2019  Months in reverse 0 points    Immunizations Immunization History  Administered Date(s) Administered   Fluad Quad(high Dose 65+) 01/04/2019, 01/17/2020   Influenza, High Dose Seasonal PF 02/10/2016, 01/26/2017, 02/06/2018   Influenza,inj,Quad PF,6+ Mos 02/04/2013, 02/01/2014, 02/06/2015   Influenza,inj,quad, With Preservative 08/07/2016   Moderna Sars-Covid-2 Vaccination 06/05/2019, 07/03/2019, 03/01/2020   Pneumococcal Conjugate-13 08/14/2015, 10/20/2016   Pneumococcal Polysaccharide-23 10/20/2016   Tdap 08/19/2014   Zoster Recombinat (Shingrix) 12/28/2017, 04/12/2018   Zoster, Live 05/11/2011   Health Maintenance Health Maintenance  Topic Date Due   COVID-19 Vaccine (4 - Booster for Moderna series) 11/12/2020 (Originally 06/01/2020)   INFLUENZA VACCINE  12/07/2020   MAMMOGRAM  10/09/2021   TETANUS/TDAP  08/18/2024   COLONOSCOPY (Pts 45-63yrs Insurance coverage will need to be confirmed)  05/24/2026   DEXA SCAN  Completed   Hepatitis C Screening  Completed   PNA vac Low Risk Adult  Completed   Zoster Vaccines- Shingrix  Completed   HPV VACCINES  Aged Out    Bone density- ordered wit [facility]. Awaiting scheduling.   Lung Cancer Screening: (Low Dose CT Chest recommended if Age 102-80 years, 30 pack-year currently smoking OR have quit w/in 15years.) does not qualify.   Vision Screening: Recommended annual ophthalmology exams for early detection of glaucoma and other disorders of the eye. Is the patient up to date with their annual eye exam?  Yes   Dental Screening: Recommended annual dental exams for proper oral hygiene. Visits every 6 months.   Community Resource Referral / Chronic Care Management: CRR required this visit?  No   CCM required this visit?  No      Plan:  Keep all routine maintenance appointments.  I have personally reviewed and noted the following in the patient's chart:   Medical and social history Use of alcohol, tobacco or illicit drugs  Current medications and supplements including opioid prescriptions. Patient does not currently take opioid. Functional ability and status Nutritional status Physical activity Advanced directives List of other physicians Hospitalizations, surgeries, and ER visits in previous 12 months Vitals Screenings to include cognitive,  depression, and falls Referrals and appointments  In addition, I have reviewed and discussed with patient certain preventive protocols, quality metrics, and best practice recommendations. A written personalized care plan for preventive services as well as general preventive health recommendations were provided to patient.     Varney Biles, LPN   [id number]

## 2020-10-28 ENCOUNTER — Encounter: Payer: Self-pay | Admitting: Internal Medicine

## 2020-10-28 NOTE — Telephone Encounter (Signed)
Please call pt.  It is hard to know if the pain is msk in origin, from being sick, etc.  If she continues to have pain and does not improve, I do recommend an evaluation.

## 2020-10-29 ENCOUNTER — Other Ambulatory Visit: Payer: Medicare Other

## 2020-11-03 ENCOUNTER — Encounter: Payer: Self-pay | Admitting: Internal Medicine

## 2020-11-03 DIAGNOSIS — K582 Mixed irritable bowel syndrome: Secondary | ICD-10-CM | POA: Diagnosis not present

## 2020-11-03 DIAGNOSIS — K219 Gastro-esophageal reflux disease without esophagitis: Secondary | ICD-10-CM | POA: Diagnosis not present

## 2020-11-04 DIAGNOSIS — M6281 Muscle weakness (generalized): Secondary | ICD-10-CM | POA: Diagnosis not present

## 2020-11-04 DIAGNOSIS — M25461 Effusion, right knee: Secondary | ICD-10-CM | POA: Diagnosis not present

## 2020-11-04 DIAGNOSIS — M25561 Pain in right knee: Secondary | ICD-10-CM | POA: Diagnosis not present

## 2020-11-04 DIAGNOSIS — R262 Difficulty in walking, not elsewhere classified: Secondary | ICD-10-CM | POA: Diagnosis not present

## 2020-11-12 ENCOUNTER — Other Ambulatory Visit (INDEPENDENT_AMBULATORY_CARE_PROVIDER_SITE_OTHER): Payer: Medicare Other

## 2020-11-12 ENCOUNTER — Other Ambulatory Visit: Payer: Self-pay

## 2020-11-12 DIAGNOSIS — E782 Mixed hyperlipidemia: Secondary | ICD-10-CM

## 2020-11-12 DIAGNOSIS — I10 Essential (primary) hypertension: Secondary | ICD-10-CM

## 2020-11-12 LAB — CBC WITH DIFFERENTIAL/PLATELET
Basophils Absolute: 0 10*3/uL (ref 0.0–0.1)
Basophils Relative: 0.9 % (ref 0.0–3.0)
Eosinophils Absolute: 0.1 10*3/uL (ref 0.0–0.7)
Eosinophils Relative: 3.1 % (ref 0.0–5.0)
HCT: 37.5 % (ref 36.0–46.0)
Hemoglobin: 12.8 g/dL (ref 12.0–15.0)
Lymphocytes Relative: 20.4 % (ref 12.0–46.0)
Lymphs Abs: 0.9 10*3/uL (ref 0.7–4.0)
MCHC: 34.1 g/dL (ref 30.0–36.0)
MCV: 98.4 fl (ref 78.0–100.0)
Monocytes Absolute: 0.4 10*3/uL (ref 0.1–1.0)
Monocytes Relative: 8.5 % (ref 3.0–12.0)
Neutro Abs: 3 10*3/uL (ref 1.4–7.7)
Neutrophils Relative %: 67.1 % (ref 43.0–77.0)
Platelets: 244 10*3/uL (ref 150.0–400.0)
RBC: 3.81 Mil/uL — ABNORMAL LOW (ref 3.87–5.11)
RDW: 14.3 % (ref 11.5–15.5)
WBC: 4.4 10*3/uL (ref 4.0–10.5)

## 2020-11-12 LAB — TSH: TSH: 0.42 u[IU]/mL (ref 0.35–5.50)

## 2020-11-12 LAB — LIPID PANEL
Cholesterol: 159 mg/dL (ref 0–200)
HDL: 62.3 mg/dL (ref >39.00)
LDL Cholesterol: 70 mg/dL (ref 0–99)
NonHDL: 96.65
Total CHOL/HDL Ratio: 3
Triglycerides: 132 mg/dL (ref 0.0–149.0)
VLDL: 26.4 mg/dL (ref 0.0–40.0)

## 2020-11-12 LAB — BASIC METABOLIC PANEL
BUN: 12 mg/dL (ref 6–23)
CO2: 26 mEq/L (ref 19–32)
Calcium: 9.7 mg/dL (ref 8.4–10.5)
Chloride: 102 mEq/L (ref 96–112)
Creatinine, Ser: 0.77 mg/dL (ref 0.40–1.20)
GFR: 78.11 mL/min (ref >60.00)
Glucose, Bld: 85 mg/dL (ref 70–99)
Potassium: 4 mEq/L (ref 3.5–5.1)
Sodium: 140 mEq/L (ref 135–145)

## 2020-11-12 LAB — HEPATIC FUNCTION PANEL
ALT: 23 U/L (ref 0–35)
AST: 19 U/L (ref 0–37)
Albumin: 4.7 g/dL (ref 3.5–5.2)
Alkaline Phosphatase: 63 U/L (ref 39–117)
Bilirubin, Direct: 0.1 mg/dL (ref 0.0–0.3)
Total Bilirubin: 0.7 mg/dL (ref 0.2–1.2)
Total Protein: 6.6 g/dL (ref 6.0–8.3)

## 2020-11-13 DIAGNOSIS — M25561 Pain in right knee: Secondary | ICD-10-CM | POA: Diagnosis not present

## 2020-11-13 DIAGNOSIS — R262 Difficulty in walking, not elsewhere classified: Secondary | ICD-10-CM | POA: Diagnosis not present

## 2020-11-13 DIAGNOSIS — M25461 Effusion, right knee: Secondary | ICD-10-CM | POA: Diagnosis not present

## 2020-11-13 DIAGNOSIS — M6281 Muscle weakness (generalized): Secondary | ICD-10-CM | POA: Diagnosis not present

## 2020-11-19 DIAGNOSIS — M25561 Pain in right knee: Secondary | ICD-10-CM | POA: Diagnosis not present

## 2020-11-19 DIAGNOSIS — M25461 Effusion, right knee: Secondary | ICD-10-CM | POA: Diagnosis not present

## 2020-11-19 DIAGNOSIS — R262 Difficulty in walking, not elsewhere classified: Secondary | ICD-10-CM | POA: Diagnosis not present

## 2020-11-19 DIAGNOSIS — M6281 Muscle weakness (generalized): Secondary | ICD-10-CM | POA: Diagnosis not present

## 2020-11-20 DIAGNOSIS — Z85828 Personal history of other malignant neoplasm of skin: Secondary | ICD-10-CM | POA: Diagnosis not present

## 2020-11-20 DIAGNOSIS — D225 Melanocytic nevi of trunk: Secondary | ICD-10-CM | POA: Diagnosis not present

## 2020-11-20 DIAGNOSIS — D2272 Melanocytic nevi of left lower limb, including hip: Secondary | ICD-10-CM | POA: Diagnosis not present

## 2020-11-20 DIAGNOSIS — D2262 Melanocytic nevi of left upper limb, including shoulder: Secondary | ICD-10-CM | POA: Diagnosis not present

## 2020-11-20 DIAGNOSIS — D2261 Melanocytic nevi of right upper limb, including shoulder: Secondary | ICD-10-CM | POA: Diagnosis not present

## 2020-11-23 DIAGNOSIS — M25461 Effusion, right knee: Secondary | ICD-10-CM | POA: Diagnosis not present

## 2020-11-27 DIAGNOSIS — Z79899 Other long term (current) drug therapy: Secondary | ICD-10-CM | POA: Diagnosis not present

## 2020-11-27 DIAGNOSIS — M0600 Rheumatoid arthritis without rheumatoid factor, unspecified site: Secondary | ICD-10-CM | POA: Diagnosis not present

## 2020-11-27 DIAGNOSIS — M159 Polyosteoarthritis, unspecified: Secondary | ICD-10-CM | POA: Diagnosis not present

## 2020-12-02 DIAGNOSIS — M25461 Effusion, right knee: Secondary | ICD-10-CM | POA: Diagnosis not present

## 2020-12-02 DIAGNOSIS — M6281 Muscle weakness (generalized): Secondary | ICD-10-CM | POA: Diagnosis not present

## 2020-12-02 DIAGNOSIS — M25561 Pain in right knee: Secondary | ICD-10-CM | POA: Diagnosis not present

## 2020-12-02 DIAGNOSIS — R262 Difficulty in walking, not elsewhere classified: Secondary | ICD-10-CM | POA: Diagnosis not present

## 2020-12-03 DIAGNOSIS — H90A32 Mixed conductive and sensorineural hearing loss, unilateral, left ear with restricted hearing on the contralateral side: Secondary | ICD-10-CM | POA: Diagnosis not present

## 2020-12-03 DIAGNOSIS — R42 Dizziness and giddiness: Secondary | ICD-10-CM | POA: Diagnosis not present

## 2020-12-03 DIAGNOSIS — H6123 Impacted cerumen, bilateral: Secondary | ICD-10-CM | POA: Diagnosis not present

## 2020-12-03 DIAGNOSIS — H9072 Mixed conductive and sensorineural hearing loss, unilateral, left ear, with unrestricted hearing on the contralateral side: Secondary | ICD-10-CM | POA: Diagnosis not present

## 2020-12-03 DIAGNOSIS — H6982 Other specified disorders of Eustachian tube, left ear: Secondary | ICD-10-CM | POA: Diagnosis not present

## 2020-12-04 ENCOUNTER — Other Ambulatory Visit: Payer: Self-pay | Admitting: Internal Medicine

## 2020-12-07 NOTE — Progress Notes (Signed)
70 y.o. [id number] Widowed White or Caucasian Not Hispanic or Latino female here for annual exam.  No vaginal bleeding. Not sexually active.    H/O osteoporosis, treated with reclast. Last DEXA with osteopenia, off reclast. Primary has ordered a DEXA  Her Dad died 2022-10-29 at 26.   Had shingles in 3/22.  Patient's last menstrual period was 05/09/2006 (approximate).          Sexually active: No.  The current method of family planning is status post hysterectomy.    Exercising: Yes.     Walking  Smoker:  no  Health Maintenance: Pap:  11/22/2018 WNL          11/17/2016 neg.          08/07/14 neg. HR HPV:neg History of abnormal Pap:  no MMG:  10/12/20 density D Bi-rads 1 neg  01/17/20 Breast MRI negative (done for nipple retraction) BMD:   02/22/2018 Osteopenia, was taken off Reclast, in [place]. Followed by Endocrinology.  Colonoscopy: 05/24/16 normal. f/u 5 years  TDaP:  08/2014  [place]: n/a   reports that she quit smoking about 47 years ago. Her smoking use included cigarettes. She has never used smokeless tobacco. She reports that she does not drink alcohol and does not use drugs. Daughter in [place] has a 48.37 year old baby girl and has an almost 55 year old boy. Other daughter lives in [place], married, doesn't want kids.   Past Medical History:  Diagnosis Date   Allergy    Arthritis    Basal cell cancer    COVID-19 10/2020   Diverticulosis    H/O   GERD (gastroesophageal reflux disease)    Heart murmur    Hypertension    IBS (irritable bowel syndrome)    Migraine    H/O, none since stopping chocolate   Osteoporosis    Rheumatic fever    H/O   Shingles 08/2020   Status post dilation of esophageal narrowing     Past Surgical History:  Procedure Laterality Date   APPENDECTOMY  1982   BREAST BIOPSY Right 09/2014   axillary node- neg   CATARACT EXTRACTION, BILATERAL     CESAREAN SECTION     COLONOSCOPY  2018   DILATION AND CURETTAGE OF UTERUS     x2   DILATION AND  CURETTAGE, DIAGNOSTIC / THERAPEUTIC     x 2   fatty tumor     on back x2   HERNIA REPAIR     KNEE SURGERY Right    parathyroid gland one removed     PARATHYROIDECTOMY     SKIN CANCER EXCISION     BCCA    TOE SURGERY Left    joint replacement bit toe   TUBAL LIGATION     UMBILICAL HERNIA REPAIR     UPPER GI ENDOSCOPY      Current Outpatient Medications  Medication Sig Dispense Refill   acetaminophen (TYLENOL) 500 MG tablet Take 1,000 mg by mouth 2 (two) times daily as needed for moderate pain or headache.     amLODipine (NORVASC) 5 MG tablet TAKE ONE TABLET EVERY DAY (Patient taking differently: Take 5 mg by mouth daily.) 30 tablet 5   Calcium Carb-Cholecalciferol (CALCIUM 600+D) 600-800 MG-UNIT TABS Take 1 tablet by mouth daily.     Carboxymeth-Glyc-Polysorb PF (REFRESH OPTIVE MEGA-3) 0.5-1-0.5 % SOLN Place 1 drop into both eyes 2 (two) times daily.     cyclobenzaprine (FLEXERIL) 5 MG tablet TAKE ONE TABLET AT BEDTIME AS  NEEDED FORMUSCLE SPASM 30 tablet 0   dicyclomine (BENTYL) 10 MG capsule TAKE 1 CAPSULE BY MOUTH ONCE DAILY AS NEEDED AS DIRECTED BY PHYSICIAN (Patient taking differently: Take 10 mg by mouth daily as needed for spasms.) 90 capsule 1   famotidine (PEPCID) 20 MG tablet Take 20 mg by mouth every evening.     folic acid (FOLVITE) 1 MG tablet Take 1 mg by mouth daily.     losartan (COZAAR) 100 MG tablet TAKE ONE TABLET BY MOUTH EVERY DAY (Patient taking differently: 100 mg daily.) 30 tablet 3   Melatonin 5 MG CAPS Take 5 mg by mouth at bedtime.     methotrexate (RHEUMATREX) 2.5 MG tablet Take 15 mg by mouth every Friday.     Multiple Vitamin (MULTI-VITAMINS) TABS Take 1 tablet by mouth daily.      Omega-3 Fatty Acids (FISH OIL) 1200 MG CAPS Take 1,200 mg by mouth daily.     RABEprazole (ACIPHEX) 20 MG tablet Take 20 mg by mouth daily.     RESTASIS 0.05 % ophthalmic emulsion Place 1 drop into both eyes 2 (two) times daily.     rosuvastatin (CRESTOR) 5 MG tablet TAKE ONE  TABLET 3 TIMES A WEEK 15 tablet 5   Turmeric Curcumin 500 MG CAPS Take 500 mg by mouth daily.     No current facility-administered medications for this visit.    Family History  Problem Relation Age of Onset   Breast cancer Mother 24   Hypertension Mother    Stroke Mother        Late 30   Arthritis Mother    Colon polyps Mother    Cancer Mother        Breast   Hypertension Father    Hyperlipidemia Father    Dementia Father    COPD Father    Congestive Heart Failure Father    Diabetes Neg Hx    Colon cancer Neg Hx    Esophageal cancer Neg Hx    Kidney disease Neg Hx    Gallbladder disease Neg Hx     Review of Systems  All other systems reviewed and are negative.  Exam:   BP 122/74   Pulse (!) 58   Ht 5' 5.5" (1.664 m)   Wt 148 lb (67.1 kg)   LMP 05/09/2006 (Approximate)   SpO2 97%   BMI 24.25 kg/m   Weight change: '@WEIGHTCHANGE'$ @ Height:   Height: 5' 5.5" (166.4 cm)  Ht Readings from Last 3 Encounters:  12/09/20 5' 5.5" (1.664 m)  10/27/20 '5\' 5"'$  (1.651 m)  09/29/20 '5\' 5"'$  (1.651 m)    General appearance: alert, cooperative and appears stated age Head: Normocephalic, without obvious abnormality, atraumatic Neck: no adenopathy, supple, symmetrical, trachea midline and thyroid normal to inspection and palpation Breasts: normal appearance, no masses or tenderness, left nipple mildly retracted, no change Abdomen: soft, non-tender; non distended,  no masses,  no organomegaly Extremities: extremities normal, atraumatic, no cyanosis or edema Skin: Skin color, texture, turgor normal. No rashes or lesions Lymph nodes: Cervical, supraclavicular, and axillary nodes normal. No abnormal inguinal nodes palpated Neurologic: Grossly normal   Pelvic: External genitalia:  no lesions              Urethra:  normal appearing urethra with no masses, tenderness or lesions              Bartholins and Skenes: normal  Vagina: normal appearing vagina with normal color and  discharge, no lesions              Cervix: no lesions               Bimanual Exam:  Uterus:  normal size, contour, position, consistency, mobility, non-tender              Adnexa: no mass, fullness, tenderness               Rectovaginal: Confirms               Anus:  normal sphincter tone, no lesions  Gae Dry chaperoned for the exam.  1. Encounter for gynecological examination without abnormal finding Discussed breast self exam Discussed calcium and vit D intake Mammogram is UTD Colonoscopy due in 1/23 DEXA is scheduled

## 2020-12-08 DIAGNOSIS — Z20822 Contact with and (suspected) exposure to covid-19: Secondary | ICD-10-CM | POA: Diagnosis not present

## 2020-12-09 ENCOUNTER — Encounter: Payer: Self-pay | Admitting: Obstetrics and Gynecology

## 2020-12-09 ENCOUNTER — Other Ambulatory Visit: Payer: Self-pay

## 2020-12-09 ENCOUNTER — Ambulatory Visit (INDEPENDENT_AMBULATORY_CARE_PROVIDER_SITE_OTHER): Payer: Medicare Other | Admitting: Obstetrics and Gynecology

## 2020-12-09 VITALS — BP 122/74 | HR 58 | Ht 65.5 in | Wt 148.0 lb

## 2020-12-09 DIAGNOSIS — Z01419 Encounter for gynecological examination (general) (routine) without abnormal findings: Secondary | ICD-10-CM

## 2020-12-09 NOTE — Patient Instructions (Signed)

## 2020-12-15 DIAGNOSIS — H6982 Other specified disorders of Eustachian tube, left ear: Secondary | ICD-10-CM | POA: Diagnosis not present

## 2020-12-23 DIAGNOSIS — M8588 Other specified disorders of bone density and structure, other site: Secondary | ICD-10-CM | POA: Diagnosis not present

## 2020-12-23 LAB — HM DEXA SCAN

## 2020-12-24 DIAGNOSIS — M1711 Unilateral primary osteoarthritis, right knee: Secondary | ICD-10-CM | POA: Diagnosis not present

## 2020-12-24 IMAGING — DX CERVICAL SPINE - 2-3 VIEW
4 series · 4 of 4 positions shown · non-contrast
Comparison: Cervical spine MRI 01/07/2016

CLINICAL DATA: Persistent neck pain for 6 months.

EXAM:
CERVICAL SPINE - 2-3 VIEW

[cervical spine ap]
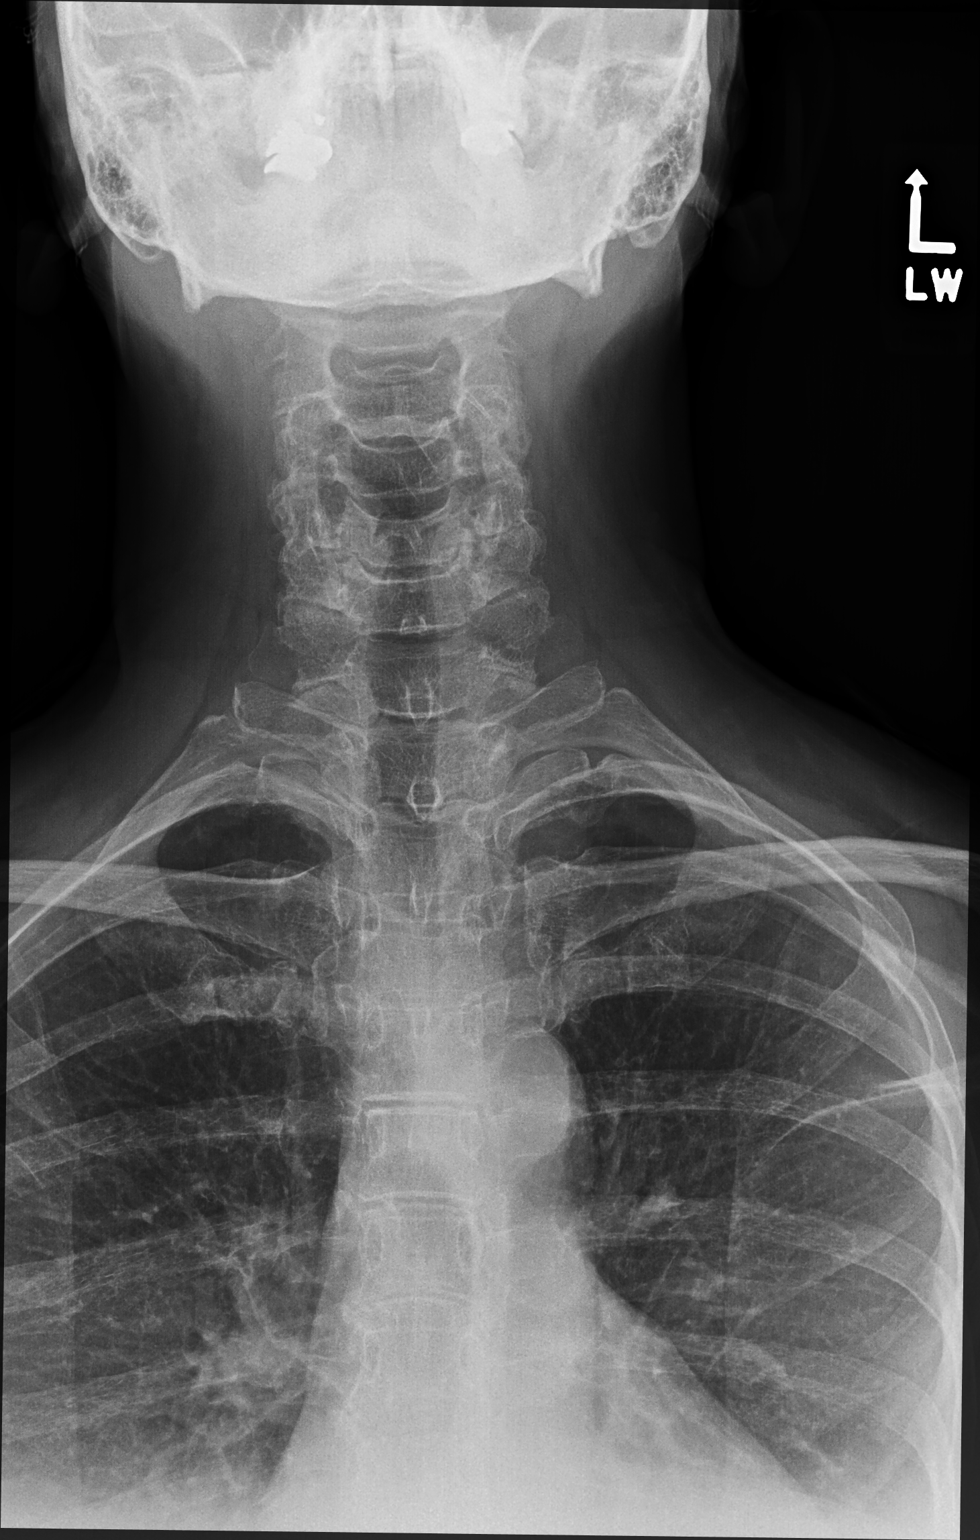

[cervical spine lat]
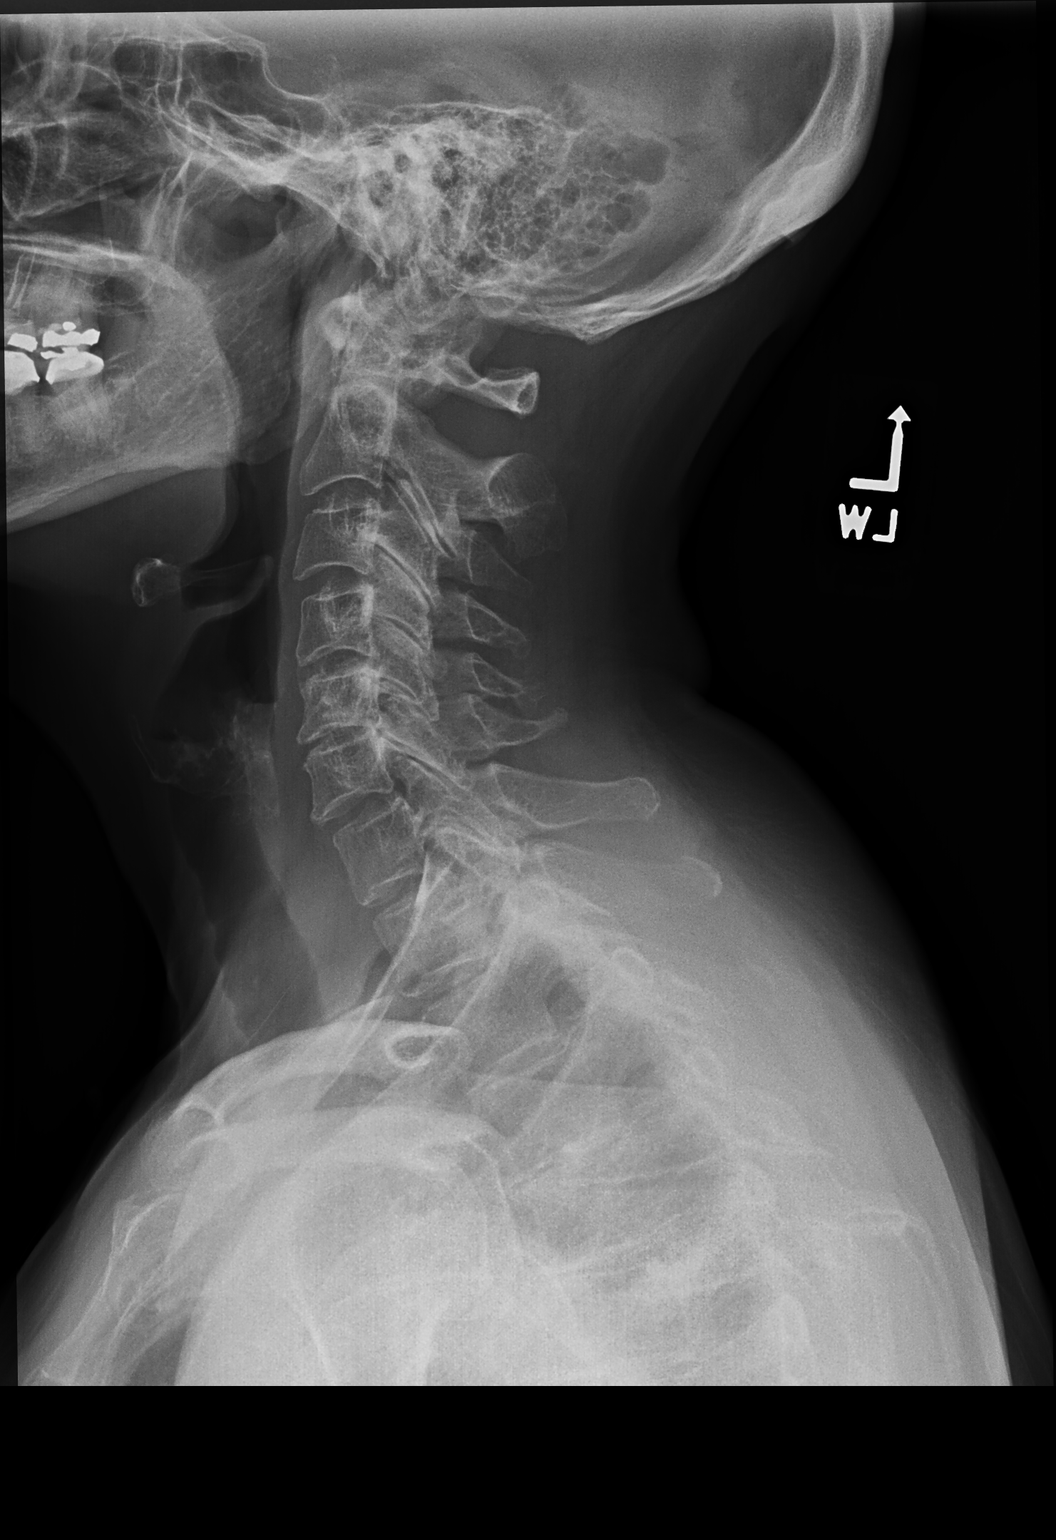

[swimmers lat]
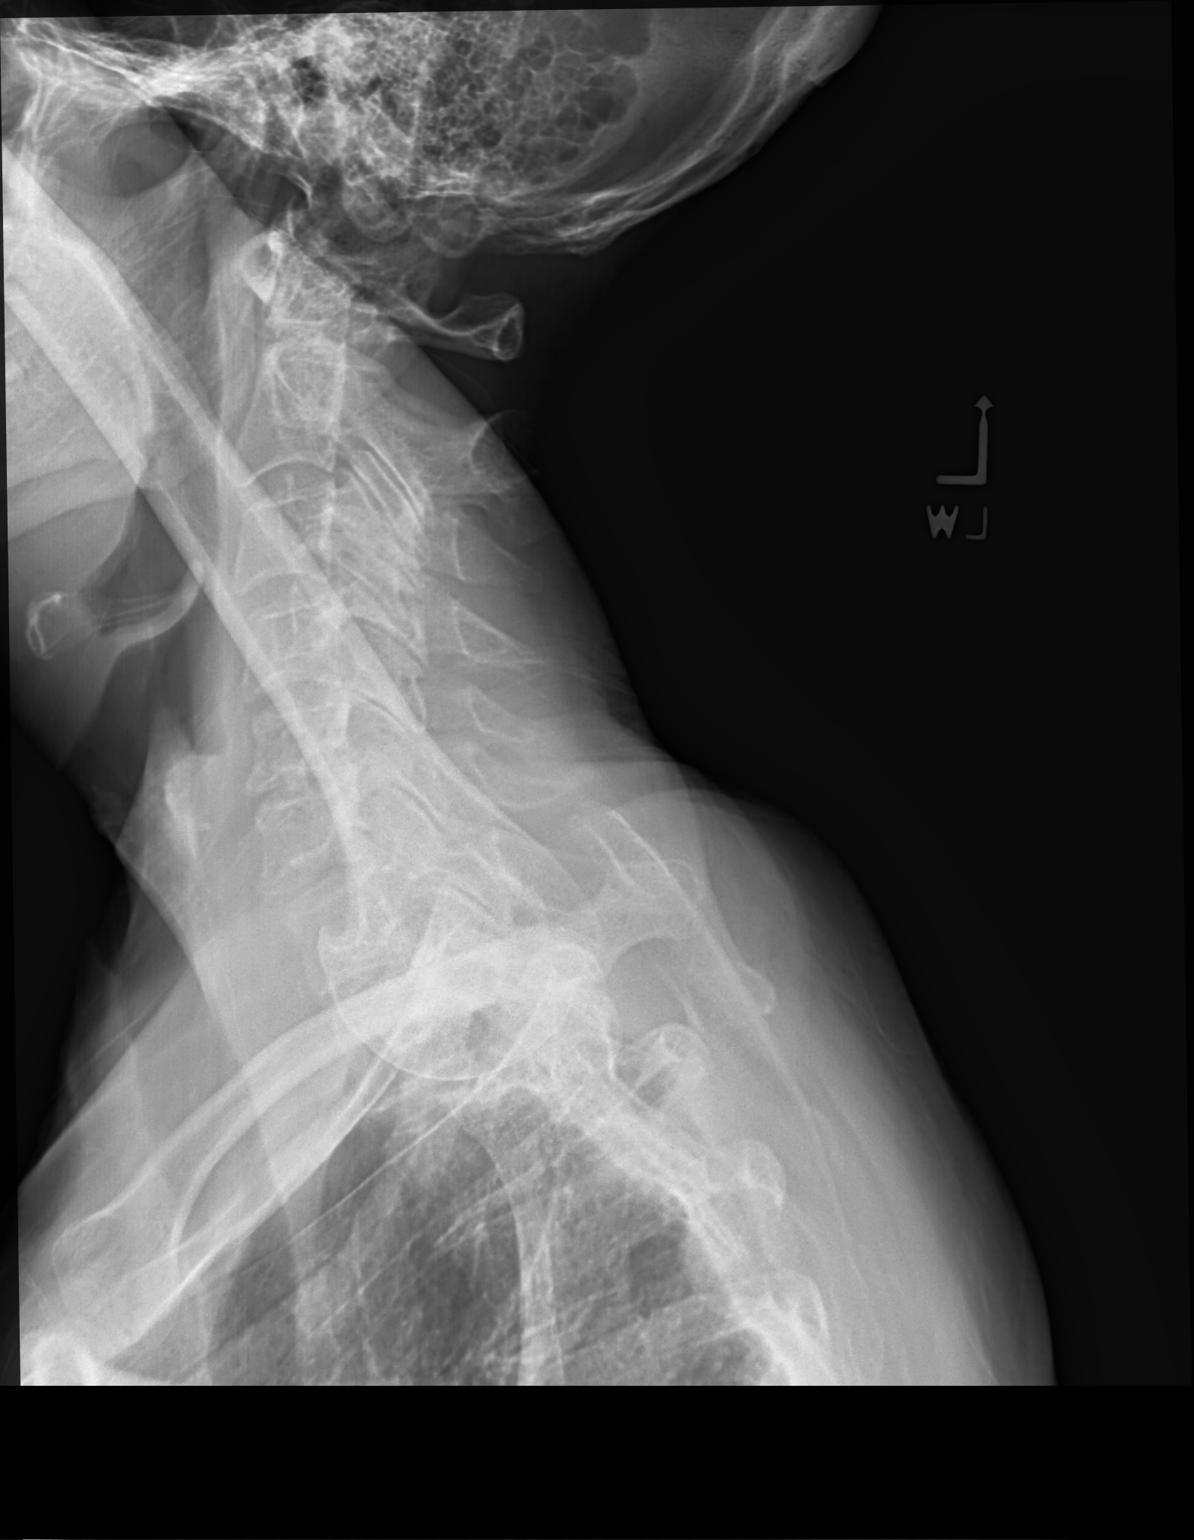

[cervical spine open mouth ap]
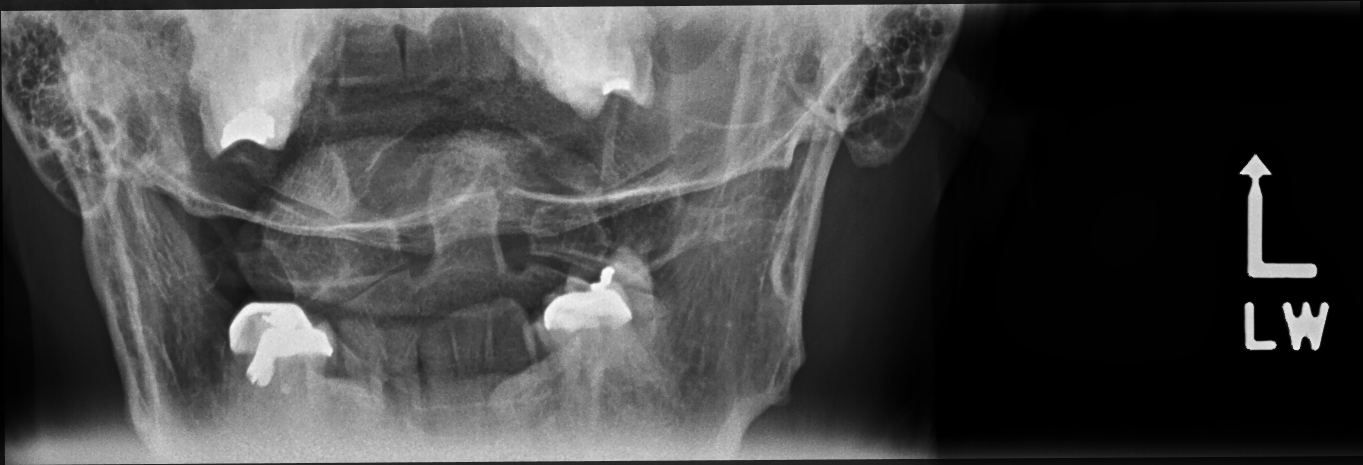

[4 of 4 positions shown; findings below may reference images not displayed]

FINDINGS: There is trace anterolisthesis of C3 on C4 and C6 on C7. There is
new mild disc space narrowing at C4-5 and progressive, moderate disc
space narrowing at C5-6 with mild degenerative endplate spurring.
Mild spurring and at most mild disc space narrowing are also present
at C6-7. There is mild-to-moderate multilevel facet arthrosis. No
fracture is identified. The prevertebral soft tissues are within
normal limits. The lung apices are clear.
IMPRESSION: Progressive cervical disc degeneration, greatest at C5-6. No
evidence of acute osseous abnormality.

## 2020-12-30 ENCOUNTER — Ambulatory Visit: Payer: Medicare Other | Admitting: Podiatry

## 2020-12-31 DIAGNOSIS — M1711 Unilateral primary osteoarthritis, right knee: Secondary | ICD-10-CM | POA: Diagnosis not present

## 2021-01-07 DIAGNOSIS — M1711 Unilateral primary osteoarthritis, right knee: Secondary | ICD-10-CM | POA: Diagnosis not present

## 2021-01-12 ENCOUNTER — Ambulatory Visit (INDEPENDENT_AMBULATORY_CARE_PROVIDER_SITE_OTHER): Payer: Medicare Other | Admitting: Internal Medicine

## 2021-01-12 ENCOUNTER — Other Ambulatory Visit: Payer: Self-pay

## 2021-01-12 ENCOUNTER — Encounter: Payer: Self-pay | Admitting: Internal Medicine

## 2021-01-12 ENCOUNTER — Telehealth: Payer: Self-pay | Admitting: Internal Medicine

## 2021-01-12 VITALS — BP 122/78 | HR 70 | Temp 97.8°F | Resp 16 | Ht 66.0 in | Wt 149.0 lb

## 2021-01-12 DIAGNOSIS — K219 Gastro-esophageal reflux disease without esophagitis: Secondary | ICD-10-CM | POA: Diagnosis not present

## 2021-01-12 DIAGNOSIS — E210 Primary hyperparathyroidism: Secondary | ICD-10-CM | POA: Diagnosis not present

## 2021-01-12 DIAGNOSIS — I10 Essential (primary) hypertension: Secondary | ICD-10-CM

## 2021-01-12 DIAGNOSIS — M25561 Pain in right knee: Secondary | ICD-10-CM

## 2021-01-12 DIAGNOSIS — I773 Arterial fibromuscular dysplasia: Secondary | ICD-10-CM | POA: Diagnosis not present

## 2021-01-12 DIAGNOSIS — M8580 Other specified disorders of bone density and structure, unspecified site: Secondary | ICD-10-CM | POA: Diagnosis not present

## 2021-01-12 DIAGNOSIS — Z23 Encounter for immunization: Secondary | ICD-10-CM | POA: Diagnosis not present

## 2021-01-12 DIAGNOSIS — E782 Mixed hyperlipidemia: Secondary | ICD-10-CM

## 2021-01-12 DIAGNOSIS — G479 Sleep disorder, unspecified: Secondary | ICD-10-CM | POA: Diagnosis not present

## 2021-01-12 DIAGNOSIS — I701 Atherosclerosis of renal artery: Secondary | ICD-10-CM

## 2021-01-12 DIAGNOSIS — M069 Rheumatoid arthritis, unspecified: Secondary | ICD-10-CM | POA: Diagnosis not present

## 2021-01-12 DIAGNOSIS — Z8371 Family history of colonic polyps: Secondary | ICD-10-CM

## 2021-01-12 NOTE — Telephone Encounter (Signed)
Needing lab orders placed for 04/09/21 lab appointment. Appointment requested per check out note.

## 2021-01-12 NOTE — Progress Notes (Signed)
Patient ID: Cassandria Lubich, female   DOB: March 05, 1951, 70 y.o.   MRN: [id number]   Subjective:    Patient ID: Lenetta Gunnoe, female    DOB: Feb 19, 1951, 70 y.o.   MRN: [id number]  This visit occurred during the SARS-CoV-2 public health emergency.  Safety protocols were in place, including screening questions prior to the visit, additional usage of staff PPE, and extensive cleaning of exam room while observing appropriate contact time as indicated for disinfecting solutions.   Patient here for a scheduled follow up.   Chief Complaint  Patient presents with   Hyperlipidemia   Hypertension   Gastroesophageal Reflux   .   HPI She has been having increased problems with her right knee.  Has seen [facility] and Dr Nicholes Stairs.  S/p injection and prednisone.  No significant relief.  Per notes, had f/u with [facility] - 4/14 - MRI - stress reaction involving the medial femur and tibia.  Also presence of medial meniscus tear of the posterior horn and root.  Also tearing of the lateral meniscus, anterior horn chondromalacia.  Recommended brace, walker and tramadol. Saw Dr Nicholes Stairs 4/18.  Discussed arthroscopy.  She lives by herself.  Concerned regarding taking care of herself. Knee  - hurts to walk.  She pushes through.  No chest pain or sob reported.  No abdominal pain reported.  Trying to adjust her diet - avoiding gravy, white flour, butter and milk. Not sleeping well.  Takes tylenol pm and melatonin.     Past Medical History:  Diagnosis Date   Allergy    Arthritis    Basal cell cancer    COVID-19 10/2020   Diverticulosis    H/O   GERD (gastroesophageal reflux disease)    Heart murmur    Hypertension    IBS (irritable bowel syndrome)    Migraine    H/O, none since stopping chocolate   Osteoporosis    Rheumatic fever    H/O   Shingles 08/2020   Status post dilation of esophageal narrowing    Past Surgical History:  Procedure Laterality Date   APPENDECTOMY  1982   BREAST BIOPSY Right  09/2014   axillary node- neg   CATARACT EXTRACTION, BILATERAL     CESAREAN SECTION     COLONOSCOPY  2018   DILATION AND CURETTAGE OF UTERUS     x2   DILATION AND CURETTAGE, DIAGNOSTIC / THERAPEUTIC     x 2   fatty tumor     on back x2   HERNIA REPAIR     KNEE SURGERY Right    parathyroid gland one removed     PARATHYROIDECTOMY     SKIN CANCER EXCISION     BCCA    TOE SURGERY Left    joint replacement bit toe   TUBAL LIGATION     UMBILICAL HERNIA REPAIR     UPPER GI ENDOSCOPY     Family History  Problem Relation Age of Onset   Breast cancer Mother 49   Hypertension Mother    Stroke Mother        Late 86   Arthritis Mother    Colon polyps Mother    Cancer Mother        Breast   Hypertension Father    Hyperlipidemia Father    Dementia Father    COPD Father    Congestive Heart Failure Father    Diabetes Neg Hx    Colon cancer Neg Hx    Esophageal cancer Neg  Hx    Kidney disease Neg Hx    Gallbladder disease Neg Hx    Social History   Socioeconomic History   Marital status: Widowed    Spouse name: Not on file   Number of children: 2   Years of education: Not on file   Highest education level: Not on file  Occupational History   Occupation: Retired    Fish farm manager: OTHER  Tobacco Use   Smoking status: Former    Years: 7.00    Types: Cigarettes    Quit date: 05/09/1973    Years since quitting: 47.7   Smokeless tobacco: Never   Tobacco comments:    Quit at age 63  Vaping Use   Vaping Use: Never used  Substance and Sexual Activity   Alcohol use: No    Alcohol/week: 0.0 standard drinks   Drug use: No   Sexual activity: Not Currently  Other Topics Concern   Not on file  Social History Narrative   Not on file   Social Determinants of Health   Financial Resource Strain: Low Risk    Difficulty of Paying Living Expenses: Not hard at all  Food Insecurity: No Food Insecurity   Worried About Charity fundraiser in the Last Year: Never true   [place]  in the Last Year: Never true  Transportation Needs: No Transportation Needs   Lack of Transportation (Medical): No   Lack of Transportation (Non-Medical): No  Physical Activity: Not on file  Stress: No Stress Concern Present   Feeling of Stress : Only a little  Social Connections: Unknown   Frequency of Communication with Friends and Family: Once a week   Frequency of Social Gatherings with Friends and Family: Not on file   Attends Religious Services: Not on file   Active Member of Clubs or Organizations: Not on file   Attends Archivist Meetings: Not on file   Marital Status: Not on file    Review of Systems  Constitutional:  Negative for appetite change and unexpected weight change.  HENT:  Negative for congestion and sinus pressure.   Respiratory:  Negative for cough, chest tightness and shortness of breath.   Cardiovascular:  Negative for chest pain, palpitations and leg swelling.  Gastrointestinal:  Negative for abdominal pain, diarrhea, nausea and vomiting.  Genitourinary:  Negative for difficulty urinating and dysuria.  Musculoskeletal:  Negative for myalgias.       Right knee pain as outlined.   Skin:  Negative for color change and rash.  Neurological:  Negative for dizziness, light-headedness and headaches.  Psychiatric/Behavioral:  Positive for sleep disturbance. Negative for agitation and dysphoric mood.       Objective:     BP 122/78   Pulse 70   Temp 97.8 F (36.6 C)   Resp 16   Ht '5\' 6"'$  (1.676 m)   Wt 149 lb (67.6 kg)   LMP 05/09/2006 (Approximate)   SpO2 98%   BMI 24.05 kg/m  Wt Readings from Last 3 Encounters:  01/12/21 149 lb (67.6 kg)  12/09/20 148 lb (67.1 kg)  10/27/20 148 lb (67.1 kg)    Physical Exam Vitals reviewed.  Constitutional:      General: She is not in acute distress.    Appearance: Normal appearance.  HENT:     Head: Normocephalic and atraumatic.     Right Ear: External ear normal.     Left Ear: External ear normal.   Eyes:  General: No scleral icterus.       Right eye: No discharge.        Left eye: No discharge.     Conjunctiva/sclera: Conjunctivae normal.  Neck:     Thyroid: No thyromegaly.  Cardiovascular:     Rate and Rhythm: Normal rate and regular rhythm.  Pulmonary:     Effort: No respiratory distress.     Breath sounds: Normal breath sounds. No wheezing.  Abdominal:     General: Bowel sounds are normal.     Palpations: Abdomen is soft.     Tenderness: There is no abdominal tenderness.  Musculoskeletal:        General: No swelling or tenderness.     Cervical back: Neck supple. No tenderness.  Lymphadenopathy:     Cervical: No cervical adenopathy.  Skin:    Findings: No erythema or rash.  Neurological:     Mental Status: She is alert.  Psychiatric:        Mood and Affect: Mood normal.        Behavior: Behavior normal.     Outpatient Encounter Medications as of 01/12/2021  Medication Sig   acetaminophen (TYLENOL) 500 MG tablet Take 1,000 mg by mouth 2 (two) times daily as needed for moderate pain or headache.   amLODipine (NORVASC) 5 MG tablet TAKE ONE TABLET EVERY DAY (Patient taking differently: Take 5 mg by mouth daily.)   Calcium Carb-Cholecalciferol (CALCIUM 600+D) 600-800 MG-UNIT TABS Take 1 tablet by mouth daily.   Carboxymeth-Glyc-Polysorb PF (REFRESH OPTIVE MEGA-3) 0.5-1-0.5 % SOLN Place 1 drop into both eyes 2 (two) times daily.   cyclobenzaprine (FLEXERIL) 5 MG tablet TAKE ONE TABLET AT BEDTIME AS NEEDED FORMUSCLE SPASM   dicyclomine (BENTYL) 10 MG capsule TAKE 1 CAPSULE BY MOUTH ONCE DAILY AS NEEDED AS DIRECTED BY PHYSICIAN (Patient taking differently: Take 10 mg by mouth daily as needed for spasms.)   famotidine (PEPCID) 20 MG tablet Take 20 mg by mouth every evening.   folic acid (FOLVITE) 1 MG tablet Take 1 mg by mouth daily.   losartan (COZAAR) 100 MG tablet TAKE ONE TABLET BY MOUTH EVERY DAY (Patient taking differently: 100 mg daily.)   Melatonin 5 MG CAPS Take 5  mg by mouth at bedtime.   methotrexate (RHEUMATREX) 2.5 MG tablet Take 15 mg by mouth every Friday.   Multiple Vitamin (MULTI-VITAMINS) TABS Take 1 tablet by mouth daily.    Omega-3 Fatty Acids (FISH OIL) 1200 MG CAPS Take 1,200 mg by mouth daily.   RABEprazole (ACIPHEX) 20 MG tablet Take 20 mg by mouth daily.   RESTASIS 0.05 % ophthalmic emulsion Place 1 drop into both eyes 2 (two) times daily.   rosuvastatin (CRESTOR) 5 MG tablet TAKE ONE TABLET 3 TIMES A WEEK   Turmeric Curcumin 500 MG CAPS Take 500 mg by mouth daily.   No facility-administered encounter medications on file as of 01/12/2021.     Lab Results  Component Value Date   WBC 4.4 11/12/2020   HGB 12.8 11/12/2020   HCT 37.5 11/12/2020   PLT 244.0 11/12/2020   GLUCOSE 85 11/12/2020   CHOL 159 11/12/2020   TRIG 132.0 11/12/2020   HDL 62.30 11/12/2020   LDLDIRECT 114.0 12/17/2018   LDLCALC 70 11/12/2020   ALT 23 11/12/2020   AST 19 11/12/2020   NA 140 11/12/2020   K 4.0 11/12/2020   CL 102 11/12/2020   CREATININE 0.77 11/12/2020   BUN 12 11/12/2020   CO2 26 11/12/2020  TSH 0.42 11/12/2020   HGBA1C 5.6 12/31/2014    DG Chest 2 View  Result Date: 10/26/2020 CLINICAL DATA:  Cough COVID EXAM: CHEST - 2 VIEW COMPARISON:  None. FINDINGS: The heart size and mediastinal contours are within normal limits. Both lungs are clear. The visualized skeletal structures are unremarkable. IMPRESSION: No active cardiopulmonary disease. Electronically Signed   By: Donavan Foil M.D.   On: 10/26/2020 16:00       Assessment & Plan:   Problem List Items Addressed This Visit     Family history of colonic polyps    Colonoscopy 05/2016 - recommended f/u in 5 years.        Fibromuscular dysplasia ([place])    Evaluated by [facility].  Stable.  Recommended f/u prn       GERD (gastroesophageal reflux disease)    Continues on aciphex.  Follow.       Hyperlipidemia    Continue crestor.  Low cholesterol diet and exercise.  Follow lipid panel.        Hypertension    Continue losartan and amlodipine.  Follow pressures.  Follow metabolic panel.       Osteopenia    Recent bone density revealed osteopenia.  Weight bearing exercise.  Calcium with vitamin D.        Primary hyperparathyroidism ([place])    Worked up by endocrinology.  Follow calcium level.       Rheumatoid arthritis ([place])    Followed by rheumatology.  On MTX.  Appears to be stable.       Right knee pain    Being evaluated by ortho.  MRI as outlined.       Sleep difficulties    Having difficulty sleeping.  Discussed.  Hold on further medication.  Follow.       Other Visit Diagnoses     Need for influenza vaccination    -  Primary   Relevant Orders   Flu Vaccine QUAD High Dose(Fluad) (Completed)        Einar Pheasant, MD

## 2021-01-13 ENCOUNTER — Encounter: Payer: Self-pay | Admitting: Internal Medicine

## 2021-01-13 NOTE — Telephone Encounter (Signed)
I ordered lipid, liver, met b. Just wanted to confirm that nothing needs to be added.

## 2021-01-13 NOTE — Addendum Note (Signed)
Addended by: Lars Masson on: 01/13/2021 02:37 PM   Modules accepted: Orders

## 2021-01-17 ENCOUNTER — Encounter: Payer: Self-pay | Admitting: Internal Medicine

## 2021-01-17 DIAGNOSIS — M25561 Pain in right knee: Secondary | ICD-10-CM | POA: Insufficient documentation

## 2021-01-17 NOTE — Assessment & Plan Note (Signed)
Worked up by endocrinology.  Follow calcium level.  

## 2021-01-17 NOTE — Assessment & Plan Note (Signed)
Colonoscopy 05/2016 - recommended f/u in 5 years.

## 2021-01-17 NOTE — Assessment & Plan Note (Signed)
Evaluated by [facility].  Stable.  Recommended f/u prn.  

## 2021-01-17 NOTE — Assessment & Plan Note (Signed)
Followed by rheumatology.  On MTX.  Appears to be stable.  

## 2021-01-17 NOTE — Assessment & Plan Note (Signed)
Having difficulty sleeping.  Discussed.  Hold on further medication.  Follow.

## 2021-01-17 NOTE — Assessment & Plan Note (Signed)
Continue crestor.  Low cholesterol diet and exercise.  Follow lipid panel.  

## 2021-01-17 NOTE — Assessment & Plan Note (Signed)
Continue losartan and amlodipine.  Follow pressures.  Follow metabolic panel.  

## 2021-01-17 NOTE — Assessment & Plan Note (Signed)
Continues on aciphex.  Follow.

## 2021-01-17 NOTE — Assessment & Plan Note (Signed)
Recent bone density revealed osteopenia.  Weight bearing exercise.  Calcium with vitamin D.

## 2021-01-17 NOTE — Assessment & Plan Note (Signed)
Being evaluated by ortho.  MRI as outlined.

## 2021-01-23 ENCOUNTER — Other Ambulatory Visit: Payer: Self-pay | Admitting: Internal Medicine

## 2021-02-02 ENCOUNTER — Other Ambulatory Visit: Payer: Self-pay | Admitting: Internal Medicine

## 2021-02-02 DIAGNOSIS — K582 Mixed irritable bowel syndrome: Secondary | ICD-10-CM | POA: Diagnosis not present

## 2021-02-02 DIAGNOSIS — K219 Gastro-esophageal reflux disease without esophagitis: Secondary | ICD-10-CM | POA: Diagnosis not present

## 2021-02-19 ENCOUNTER — Telehealth: Payer: Self-pay | Admitting: Internal Medicine

## 2021-02-19 NOTE — Telephone Encounter (Signed)
Patient came in to drop off her medical release [place] paperwork that she has Dr.Scott fill out yearly.Please call her when it is ready,it is upfront in Dr.Scott's color folder.

## 2021-02-22 NOTE — Telephone Encounter (Signed)
Paperwork up front for pick up. Patient is aware

## 2021-02-22 NOTE — Telephone Encounter (Signed)
Form placed out for your signature

## 2021-02-22 NOTE — Telephone Encounter (Signed)
Signed and placed in box.   

## 2021-02-23 ENCOUNTER — Encounter: Payer: Self-pay | Admitting: General Surgery

## 2021-03-02 DIAGNOSIS — M13842 Other specified arthritis, left hand: Secondary | ICD-10-CM | POA: Diagnosis not present

## 2021-03-03 ENCOUNTER — Other Ambulatory Visit: Payer: Self-pay

## 2021-03-19 NOTE — Discharge Instructions (Signed)
[facility] DISCHARGE INSTRUCTIONS FOR MYRINGOTOMY AND TUBE INSERTION  [facility] Carloyn Manner, M.D.   Diet:   After surgery, the patient should take only liquids and foods as tolerated.  The patient may then have a regular diet after the effects of anesthesia have worn off, usually about four to six hours after surgery.  Activities:   The patient should rest until the effects of anesthesia have worn off.  After this, there are no restrictions on the normal daily activities.  Medications:   You will be given antibiotic drops to be used in the ears postoperatively.  It is recommended to use 4 drops 2 times a day for 4 days, then the drops should be saved for possible future use.  The tubes should not cause any discomfort to the patient, but if there is any question, Tylenol should be given according to the instructions for the age of the patient.  Other medications should be continued normally.  Precautions:   Should there be recurrent drainage after the tubes are placed, the drops should be used for approximately 3-4 days.  If it does not clear, you should call the ENT office.  Earplugs:   Earplugs are only needed for those who are going to be submerged under water.  When taking a bath or shower and using a cup or showerhead to rinse hair, it is not necessary to wear earplugs.  These come in a variety of fashions, all of which can be obtained at our office.  However, if one is not able to come by the office, then silicone plugs can be found at most pharmacies.  It is not advised to stick anything in the ear that is not approved as an earplug.  Silly putty is not to be used as an earplug.  Swimming is allowed in patients after ear tubes are inserted, however, they must wear earplugs if they are going to be submerged under water.  For those children who are going to be swimming a lot, it is recommended to use a fitted ear mold, which can be made by our  audiologist.  If discharge is noticed from the ears, this most likely represents an ear infection.  We would recommend getting your eardrops and using them as indicated above.  If it does not clear, then you should call the ENT office.  For follow up, the patient should return to the ENT office three weeks postoperatively and then every six months as required by the doctor.

## 2021-03-24 ENCOUNTER — Other Ambulatory Visit: Payer: Self-pay

## 2021-03-24 ENCOUNTER — Ambulatory Visit: Payer: Medicare Other | Admitting: Anesthesiology

## 2021-03-24 ENCOUNTER — Ambulatory Visit
Admission: RE | Admit: 2021-03-24 | Discharge: 2021-03-24 | Disposition: A | Discharge status: Home / Self Care | Payer: Medicare Other | Attending: Otolaryngology | Admitting: Otolaryngology

## 2021-03-24 ENCOUNTER — Encounter: Payer: Self-pay | Admitting: Otolaryngology

## 2021-03-24 ENCOUNTER — Encounter
Admission: RE | Disposition: A | Discharge status: Home / Self Care | Payer: Self-pay | Source: Home / Self Care | Attending: Otolaryngology

## 2021-03-24 DIAGNOSIS — F419 Anxiety disorder, unspecified: Secondary | ICD-10-CM | POA: Insufficient documentation

## 2021-03-24 DIAGNOSIS — E785 Hyperlipidemia, unspecified: Secondary | ICD-10-CM | POA: Insufficient documentation

## 2021-03-24 DIAGNOSIS — K219 Gastro-esophageal reflux disease without esophagitis: Secondary | ICD-10-CM | POA: Diagnosis not present

## 2021-03-24 DIAGNOSIS — R519 Headache, unspecified: Secondary | ICD-10-CM | POA: Insufficient documentation

## 2021-03-24 DIAGNOSIS — H6983 Other specified disorders of Eustachian tube, bilateral: Secondary | ICD-10-CM | POA: Diagnosis not present

## 2021-03-24 DIAGNOSIS — H6993 Unspecified Eustachian tube disorder, bilateral: Secondary | ICD-10-CM | POA: Insufficient documentation

## 2021-03-24 DIAGNOSIS — Z87891 Personal history of nicotine dependence: Secondary | ICD-10-CM | POA: Insufficient documentation

## 2021-03-24 DIAGNOSIS — M069 Rheumatoid arthritis, unspecified: Secondary | ICD-10-CM | POA: Insufficient documentation

## 2021-03-24 DIAGNOSIS — I10 Essential (primary) hypertension: Secondary | ICD-10-CM | POA: Insufficient documentation

## 2021-03-24 DIAGNOSIS — H6990 Unspecified Eustachian tube disorder, unspecified ear: Secondary | ICD-10-CM | POA: Diagnosis present

## 2021-03-24 DIAGNOSIS — I701 Atherosclerosis of renal artery: Secondary | ICD-10-CM | POA: Diagnosis not present

## 2021-03-24 DIAGNOSIS — K589 Irritable bowel syndrome without diarrhea: Secondary | ICD-10-CM | POA: Insufficient documentation

## 2021-03-24 HISTORY — DX: Other specified postprocedural states: Z98.890

## 2021-03-24 HISTORY — PX: MYRINGOTOMY WITH TUBE PLACEMENT: SHX5663

## 2021-03-24 HISTORY — DX: Nausea with vomiting, unspecified: R11.2

## 2021-03-24 SURGERY — MYRINGOTOMY WITH TUBE PLACEMENT
Anesthesia: General | Site: Ear | Laterality: Bilateral

## 2021-03-24 MED ORDER — GLYCOPYRROLATE 0.2 MG/ML IJ SOLN
INTRAMUSCULAR | Status: DC | PRN
  Administered 2021-03-24: .1 mg via INTRAVENOUS

## 2021-03-24 MED ORDER — DEXAMETHASONE SODIUM PHOSPHATE 4 MG/ML IJ SOLN
INTRAMUSCULAR | Status: DC | PRN
  Administered 2021-03-24: 4 mg via INTRAVENOUS

## 2021-03-24 MED ORDER — CIPROFLOXACIN-DEXAMETHASONE 0.3-0.1 % OT SUSP
OTIC | Status: DC | PRN
  Administered 2021-03-24: 4 [drp] via OTIC

## 2021-03-24 MED ORDER — ACETAMINOPHEN 325 MG PO TABS: 325.0000 mg | ORAL_TABLET | ORAL | Status: DC | PRN

## 2021-03-24 MED ORDER — ONDANSETRON HCL 4 MG PO TABS: 4.0000 mg | ORAL_TABLET | Freq: Three times a day (TID) | ORAL | 0 refills | Status: DC | PRN

## 2021-03-24 MED ORDER — LACTATED RINGERS IV SOLN: INTRAVENOUS | Status: DC

## 2021-03-24 MED ORDER — ONDANSETRON HCL 4 MG/2ML IJ SOLN: 4.0000 mg | Freq: Once | INTRAMUSCULAR | Status: DC | PRN

## 2021-03-24 MED ORDER — ONDANSETRON HCL 4 MG/2ML IJ SOLN
INTRAMUSCULAR | Status: DC | PRN
  Administered 2021-03-24: 4 mg via INTRAVENOUS

## 2021-03-24 MED ORDER — LIDOCAINE HCL (CARDIAC) PF 100 MG/5ML IV SOSY
PREFILLED_SYRINGE | INTRAVENOUS | Status: DC | PRN
  Administered 2021-03-24: 50 mg via INTRAVENOUS

## 2021-03-24 MED ORDER — PROPOFOL 10 MG/ML IV BOLUS
INTRAVENOUS | Status: DC | PRN
  Administered 2021-03-24 (×2): 20 mg via INTRAVENOUS
  Administered 2021-03-24: 80 mg via INTRAVENOUS
  Administered 2021-03-24 (×3): 20 mg via INTRAVENOUS

## 2021-03-24 MED ORDER — ACETAMINOPHEN 160 MG/5ML PO SOLN: 325.0000 mg | ORAL | Status: DC | PRN

## 2021-03-24 SURGICAL SUPPLY — 10 items
Armstrong Beveled Grommet Ventilation Tube, Double Pack ×3 IMPLANT
BALL CTTN LRG ABS STRL LF (GAUZE/BANDAGES/DRESSINGS) ×1
BLADE MYR LANCE NRW W/HDL (BLADE) ×3 IMPLANT
CANISTER SUCT 1200ML W/VALVE (MISCELLANEOUS) ×3 IMPLANT
COTTONBALL LRG STERILE PKG (GAUZE/BANDAGES/DRESSINGS) ×3 IMPLANT
GLOVE SURG GAMMEX PI TX LF 7.5 (GLOVE) ×3 IMPLANT
STRAP BODY AND KNEE 60X3 (MISCELLANEOUS) ×3 IMPLANT
TOWEL OR 17X26 4PK STRL BLUE (TOWEL DISPOSABLE) ×3 IMPLANT
TUBING CONN 6MMX3.1M (TUBING) ×2
TUBING SUCTION CONN 0.25 STRL (TUBING) ×1 IMPLANT

## 2021-03-24 NOTE — Anesthesia Preprocedure Evaluation (Addendum)
Anesthesia Evaluation  Patient identified by MRN, date of birth, ID band Patient awake    Reviewed: Allergy & Precautions, NPO status   Airway Mallampati: II  TM Distance: >3 FB     Dental   Pulmonary former smoker,    Pulmonary exam normal        Cardiovascular hypertension,  Rhythm:Regular Rate:Normal  HLD Renal artery stenosis   Neuro/Psych  Headaches, PSYCHIATRIC DISORDERS Anxiety    GI/Hepatic GERD  ,IBS   Endo/Other    Renal/GU      Musculoskeletal  (+) Arthritis  (rheumatoid),   Abdominal   Peds  Hematology   Anesthesia Other Findings Problem list: Osteopenia Chronic inflammatory arthritis IBS (irritable bowel syndrome) Diverticulosis GERD (gastroesophageal reflux disease) Environmental allergies Migraine Family history of colonic polyps Health care maintenance Insomnia Fibromuscular dysplasia (HCC) Primary hyperparathyroidism (HCC) Neck pain Generalized anxiety disorder Carotid stenosis Renal artery stenosis (HCC) Ganglion cyst of both wrists Closed nondisplaced fracture of styloid process of right ulna Hypertension Long-term use of Plaquenil Lipoma of back Constipation Urinary frequency Erosive gastritis PVD (posterior vitreous detachment), bilateral Severe myopia of both eyes Sleep difficulties Vaginal burning Hyperlipidemia Right low back pain Rheumatoid arthritis (HCC) Right knee pain  Reproductive/Obstetrics                            Anesthesia Physical Anesthesia Plan  ASA: 3  Anesthesia Plan: General   Post-op Pain Management:    Induction: Intravenous  PONV Risk Score and Plan: 3 and Propofol infusion, TIVA, Treatment may vary due to age or medical condition, Ondansetron and Midazolam  Airway Management Planned: Natural Airway and Nasal Cannula  Additional Equipment:   Intra-op Plan:   Post-operative Plan:   Informed Consent: I have  reviewed the patients History and Physical, chart, labs and discussed the procedure including the risks, benefits and alternatives for the proposed anesthesia with the patient or authorized representative who has indicated his/her understanding and acceptance.       Plan Discussed with: CRNA  Anesthesia Plan Comments:         Anesthesia Quick Evaluation

## 2021-03-24 NOTE — Op Note (Signed)
..  03/24/2021  8:17 AM    Victoria Moss  [id number]   Pre-Op Dx:  [name] tube dysfunction  Post-op Dx: Eustachian tube dysfunction  Proc:Bilateral myringotomy with tubes  Surg: Marty Uy  Anes:  General by mask  EBL:  None  Comp:  None  Findings:  Retraction on left.  Bilateral tubes placed anterior inferiorly  Procedure: With the patient in a comfortable supine position, general mask anesthesia was administered.  At an appropriate level, microscope and speculum were used to examine and clean the RIGHT ear canal.  The findings were as described above.  An anterior inferior radial myringotomy incision was sharply executed.  Middle ear contents were suctioned clear with a size 5 otologic suction.  A PE tube was placed without difficulty using a Rosen pick and Animal nutritionist.  Ciprodex otic solution was instilled into the external canal, and insufflated into the middle ear.  A cotton ball was placed at the external meatus. Hemostasis was observed.  This side was completed.  After completing the RIGHT side, the LEFT side was done in identical fashion.    Following this  The patient was returned to anesthesia, awakened, and transferred to recovery in stable condition.  Dispo:  PACU to home  Plan: Routine drop use and water precautions.  Recheck my office three weeks.   Victoria Moss 8:17 AM 03/24/2021

## 2021-03-24 NOTE — Transfer of Care (Signed)
Immediate Anesthesia Transfer of Care Note  Patient: Victoria Moss  Procedure(s) Performed: MYRINGOTOMY WITH TUBE PLACEMENT (Bilateral: Ear)  Patient Location: PACU  Anesthesia Type: General  Level of Consciousness: awake, alert  and patient cooperative  Airway and Oxygen Therapy: Patient Spontanous Breathing and Patient connected to supplemental oxygen  Post-op Assessment: Post-op Vital signs reviewed, Patient's Cardiovascular Status Stable, Respiratory Function Stable, Patent Airway and No signs of Nausea or vomiting  Post-op Vital Signs: Reviewed and stable  Complications: No notable events documented.

## 2021-03-24 NOTE — Anesthesia Postprocedure Evaluation (Addendum)
Anesthesia Post Note  Patient: Victoria Moss  Procedure(s) Performed: MYRINGOTOMY WITH TUBE PLACEMENT (Bilateral: Ear)     Patient location during evaluation: PACU Anesthesia Type: General Level of consciousness: awake Pain management: pain level controlled Vital Signs Assessment: post-procedure vital signs reviewed and stable Respiratory status: respiratory function stable Cardiovascular status: stable Postop Assessment: no signs of nausea or vomiting Anesthetic complications: no   No notable events documented.  Veda Canning

## 2021-03-24 NOTE — H&P (Signed)
..  History and Physical paper copy reviewed and updated date of procedure and will be scanned into system.  Patient seen and examined.  

## 2021-03-25 ENCOUNTER — Encounter: Payer: Self-pay | Admitting: Otolaryngology

## 2021-03-30 DIAGNOSIS — M159 Polyosteoarthritis, unspecified: Secondary | ICD-10-CM | POA: Diagnosis not present

## 2021-03-30 DIAGNOSIS — M0600 Rheumatoid arthritis without rheumatoid factor, unspecified site: Secondary | ICD-10-CM | POA: Diagnosis not present

## 2021-03-30 DIAGNOSIS — Z79899 Other long term (current) drug therapy: Secondary | ICD-10-CM | POA: Diagnosis not present

## 2021-04-08 DIAGNOSIS — M1711 Unilateral primary osteoarthritis, right knee: Secondary | ICD-10-CM | POA: Diagnosis not present

## 2021-04-08 DIAGNOSIS — M25561 Pain in right knee: Secondary | ICD-10-CM | POA: Diagnosis not present

## 2021-04-09 ENCOUNTER — Other Ambulatory Visit (INDEPENDENT_AMBULATORY_CARE_PROVIDER_SITE_OTHER): Payer: Medicare Other

## 2021-04-09 ENCOUNTER — Other Ambulatory Visit: Payer: Self-pay

## 2021-04-09 DIAGNOSIS — I10 Essential (primary) hypertension: Secondary | ICD-10-CM

## 2021-04-09 DIAGNOSIS — E782 Mixed hyperlipidemia: Secondary | ICD-10-CM

## 2021-04-09 LAB — HEPATIC FUNCTION PANEL
ALT: 24 U/L (ref 0–35)
AST: 17 U/L (ref 0–37)
Albumin: 5 g/dL (ref 3.5–5.2)
Alkaline Phosphatase: 67 U/L (ref 39–117)
Bilirubin, Direct: 0.1 mg/dL (ref 0.0–0.3)
Total Bilirubin: 0.5 mg/dL (ref 0.2–1.2)
Total Protein: 7.5 g/dL (ref 6.0–8.3)

## 2021-04-09 LAB — LIPID PANEL
Cholesterol: 154 mg/dL (ref 0–200)
HDL: 71.4 mg/dL (ref >39.00)
LDL Cholesterol: 71 mg/dL (ref 0–99)
NonHDL: 82.8
Total CHOL/HDL Ratio: 2
Triglycerides: 59 mg/dL (ref 0.0–149.0)
VLDL: 11.8 mg/dL (ref 0.0–40.0)

## 2021-04-09 LAB — BASIC METABOLIC PANEL
BUN: 14 mg/dL (ref 6–23)
CO2: 25 mEq/L (ref 19–32)
Calcium: 9.7 mg/dL (ref 8.4–10.5)
Chloride: 103 mEq/L (ref 96–112)
Creatinine, Ser: 0.75 mg/dL (ref 0.40–1.20)
GFR: 80.38 mL/min (ref >60.00)
Glucose, Bld: 127 mg/dL — ABNORMAL HIGH (ref 70–99)
Potassium: 3.7 mEq/L (ref 3.5–5.1)
Sodium: 138 mEq/L (ref 135–145)

## 2021-04-13 ENCOUNTER — Encounter: Payer: Self-pay | Admitting: Internal Medicine

## 2021-04-13 ENCOUNTER — Other Ambulatory Visit: Payer: Self-pay

## 2021-04-13 ENCOUNTER — Ambulatory Visit (INDEPENDENT_AMBULATORY_CARE_PROVIDER_SITE_OTHER): Payer: Medicare Other | Admitting: Internal Medicine

## 2021-04-13 DIAGNOSIS — E210 Primary hyperparathyroidism: Secondary | ICD-10-CM | POA: Diagnosis not present

## 2021-04-13 DIAGNOSIS — D171 Benign lipomatous neoplasm of skin and subcutaneous tissue of trunk: Secondary | ICD-10-CM

## 2021-04-13 DIAGNOSIS — R739 Hyperglycemia, unspecified: Secondary | ICD-10-CM | POA: Diagnosis not present

## 2021-04-13 DIAGNOSIS — I10 Essential (primary) hypertension: Secondary | ICD-10-CM

## 2021-04-13 DIAGNOSIS — H6983 Other specified disorders of Eustachian tube, bilateral: Secondary | ICD-10-CM | POA: Diagnosis not present

## 2021-04-13 DIAGNOSIS — I701 Atherosclerosis of renal artery: Secondary | ICD-10-CM | POA: Diagnosis not present

## 2021-04-13 DIAGNOSIS — M069 Rheumatoid arthritis, unspecified: Secondary | ICD-10-CM

## 2021-04-13 DIAGNOSIS — E782 Mixed hyperlipidemia: Secondary | ICD-10-CM | POA: Diagnosis not present

## 2021-04-13 DIAGNOSIS — I773 Arterial fibromuscular dysplasia: Secondary | ICD-10-CM | POA: Diagnosis not present

## 2021-04-13 DIAGNOSIS — K219 Gastro-esophageal reflux disease without esophagitis: Secondary | ICD-10-CM

## 2021-04-13 DIAGNOSIS — Z0000 Encounter for general adult medical examination without abnormal findings: Secondary | ICD-10-CM

## 2021-04-13 DIAGNOSIS — J300 Vasomotor rhinitis: Secondary | ICD-10-CM | POA: Diagnosis not present

## 2021-04-13 DIAGNOSIS — H903 Sensorineural hearing loss, bilateral: Secondary | ICD-10-CM | POA: Diagnosis not present

## 2021-04-13 DIAGNOSIS — K589 Irritable bowel syndrome without diarrhea: Secondary | ICD-10-CM | POA: Diagnosis not present

## 2021-04-13 NOTE — Progress Notes (Signed)
Patient ID: Victoria Moss, female   DOB: 12/12/1950, 70 y.o.   MRN: [id number]   Subjective:    Patient ID: Victoria Moss, female    DOB: 1950-12-24, 70 y.o.   MRN: [id number]  This visit occurred during the SARS-CoV-2 public health emergency.  Safety protocols were in place, including screening questions prior to the visit, additional usage of staff PPE, and extensive cleaning of exam room while observing appropriate contact time as indicated for disinfecting solutions.    HPI Has a past history of hypercholesterolemia, hypertension and RA. She comes in today to follow up on these issues as well as for a complete physical exam.  Is followed by Dr Posey Pronto for RA.  Per note and discussion, still having concerns regarding right knee.  Planning to see Dr Alvan Dame Emerge.  Continues on MTX and folic acid.  Saw Dr Peggye Ley - left thumb pain.  S/p injection - helped.  Tries to stay active. No chest pain.  No sob.  S/p myringotomy.  Had f/u today.  Did well flying.  No increased cough or congestion.  Saw GI 01/2021 - f/u IBS.  Due colonoscopy next year.  Discussed labs.     Past Medical History:  Diagnosis Date   Allergy    Arthritis    Basal cell cancer    COVID-19 10/2020   Diverticulosis    H/O   GERD (gastroesophageal reflux disease)    Heart murmur    Hypertension    IBS (irritable bowel syndrome)    Migraine    H/O, none since stopping chocolate   Osteoporosis    PONV (postoperative nausea and vomiting)    Rheumatic fever    H/O   Shingles 08/2020   Status post dilation of esophageal narrowing    Past Surgical History:  Procedure Laterality Date   APPENDECTOMY  1982   BREAST BIOPSY Right 09/2014   axillary node- neg   CATARACT EXTRACTION, BILATERAL     CESAREAN SECTION     COLONOSCOPY  2018   DILATION AND CURETTAGE OF UTERUS     x2   DILATION AND CURETTAGE, DIAGNOSTIC / THERAPEUTIC     x 2   fatty tumor     on back x2   HERNIA REPAIR     KNEE SURGERY Right     MYRINGOTOMY WITH TUBE PLACEMENT Bilateral 03/24/2021   Procedure: MYRINGOTOMY WITH TUBE PLACEMENT;  Surgeon: Carloyn Manner, MD;  Location: [facility];  Service: ENT;  Laterality: Bilateral;   parathyroid gland one removed     PARATHYROIDECTOMY     SKIN CANCER EXCISION     BCCA    TOE SURGERY Left    joint replacement bit toe   TUBAL LIGATION     UMBILICAL HERNIA REPAIR     UPPER GI ENDOSCOPY     Family History  Problem Relation Age of Onset   Breast cancer Mother 59   Hypertension Mother    Stroke Mother        Late 47   Arthritis Mother    Colon polyps Mother    Cancer Mother        Breast   Hypertension Father    Hyperlipidemia Father    Dementia Father    COPD Father    Congestive Heart Failure Father    Diabetes Neg Hx    Colon cancer Neg Hx    Esophageal cancer Neg Hx    Kidney disease Neg Hx    Gallbladder disease  Neg Hx    Social History   Socioeconomic History   Marital status: Widowed    Spouse name: Not on file   Number of children: 2   Years of education: Not on file   Highest education level: Not on file  Occupational History   Occupation: Retired    Fish farm manager: [employer]  Tobacco Use   Smoking status: Former    Years: 7.00    Types: Cigarettes    Quit date: 05/09/1973    Years since quitting: 47.9   Smokeless tobacco: Never   Tobacco comments:    Quit at age 73  Vaping Use   Vaping Use: Never used  Substance and Sexual Activity   Alcohol use: No    Alcohol/week: 0.0 standard drinks   Drug use: No   Sexual activity: Not Currently  Other Topics Concern   Not on file  Social History Narrative   Not on file   Social Determinants of Health   Financial Resource Strain: Low Risk    Difficulty of Paying Living Expenses: Not hard at all  Food Insecurity: No Food Insecurity   Worried About Charity fundraiser in the Last Year: Never true   [place] in the Last Year: Never true  Transportation Needs: No Transportation Needs   Lack  of Transportation (Medical): No   Lack of Transportation (Non-Medical): No  Physical Activity: Not on file  Stress: No Stress Concern Present   Feeling of Stress : Only a little  Social Connections: Unknown   Frequency of Communication with Friends and Family: Once a week   Frequency of Social Gatherings with Friends and Family: Not on file   Attends Religious Services: Not on file   Active Member of Clubs or Organizations: Not on file   Attends Archivist Meetings: Not on file   Marital Status: Not on file     Review of Systems  Constitutional:  Negative for appetite change and unexpected weight change.  HENT:  Negative for congestion, sinus pressure and sore throat.   Eyes:  Negative for pain and visual disturbance.  Respiratory:  Negative for cough, chest tightness and shortness of breath.   Cardiovascular:  Negative for chest pain, palpitations and leg swelling.  Gastrointestinal:  Negative for abdominal pain, diarrhea, nausea and vomiting.  Genitourinary:  Negative for difficulty urinating and dysuria.  Musculoskeletal:  Negative for myalgias.       Knee pain as outlined.   Skin:  Negative for color change and rash.  Neurological:  Negative for dizziness, light-headedness and headaches.  Hematological:  Negative for adenopathy. Does not bruise/bleed easily.  Psychiatric/Behavioral:  Negative for decreased concentration and dysphoric mood.       Objective:     BP 130/70   Pulse 66   Temp 99.2 F (37.3 C) (Oral)   Ht 5\' 5"  (1.651 m)   Wt 150 lb 12.8 oz (68.4 kg)   LMP 05/09/2006 (Approximate)   SpO2 97%   BMI 25.09 kg/m  Wt Readings from Last 3 Encounters:  04/13/21 150 lb 12.8 oz (68.4 kg)  03/24/21 147 lb (66.7 kg)  01/12/21 149 lb (67.6 kg)    Physical Exam Vitals reviewed.  Constitutional:      General: She is not in acute distress.    Appearance: Normal appearance. She is well-developed.  HENT:     Head: Normocephalic and atraumatic.      Right Ear: External ear normal.     Left Ear:  External ear normal.  Eyes:     General: No scleral icterus.       Right eye: No discharge.        Left eye: No discharge.     Conjunctiva/sclera: Conjunctivae normal.  Neck:     Thyroid: No thyromegaly.  Cardiovascular:     Rate and Rhythm: Normal rate and regular rhythm.  Pulmonary:     Effort: No tachypnea, accessory muscle usage or respiratory distress.     Breath sounds: Normal breath sounds. No decreased breath sounds or wheezing.  Chest:  Breasts:    Right: No inverted nipple, mass, nipple discharge or tenderness (no axillary adenopathy).     Left: No inverted nipple, mass, nipple discharge or tenderness (no axilarry adenopathy).  Abdominal:     General: Bowel sounds are normal.     Palpations: Abdomen is soft.     Tenderness: There is no abdominal tenderness.  Musculoskeletal:        General: No swelling or tenderness.     Cervical back: Neck supple. No tenderness.  Lymphadenopathy:     Cervical: No cervical adenopathy.  Skin:    Findings: No erythema or rash.  Neurological:     Mental Status: She is alert and oriented to person, place, and time.  Psychiatric:        Mood and Affect: Mood normal.        Behavior: Behavior normal.     Outpatient Encounter Medications as of 04/13/2021  Medication Sig   acetaminophen (TYLENOL) 500 MG tablet Take 1,000 mg by mouth 2 (two) times daily as needed for moderate pain or headache.   amLODipine (NORVASC) 5 MG tablet TAKE ONE TABLET BY MOUTH EVERY DAY   Calcium Carb-Cholecalciferol (CALCIUM 600+D) 600-800 MG-UNIT TABS Take 1 tablet by mouth daily.   Carboxymeth-Glyc-Polysorb PF (REFRESH OPTIVE MEGA-3) 0.5-1-0.5 % SOLN Place 1 drop into both eyes 2 (two) times daily.   cyclobenzaprine (FLEXERIL) 5 MG tablet TAKE ONE TABLET AT BEDTIME AS NEEDED FORMUSCLE SPASM   dicyclomine (BENTYL) 10 MG capsule TAKE 1 CAPSULE BY MOUTH ONCE DAILY AS NEEDED AS DIRECTED BY PHYSICIAN (Patient taking  differently: Take 10 mg by mouth daily as needed for spasms.)   famotidine (PEPCID) 20 MG tablet Take 20 mg by mouth every evening.   folic acid (FOLVITE) 1 MG tablet Take 1 mg by mouth daily.   losartan (COZAAR) 100 MG tablet TAKE 1 TABLET BY MOUTH EVERY DAY   Melatonin 5 MG CAPS Take 5 mg by mouth at bedtime.   methotrexate (RHEUMATREX) 2.5 MG tablet Take 15 mg by mouth every Friday.   Multiple Vitamin (MULTI-VITAMINS) TABS Take 1 tablet by mouth daily.    Omega-3 Fatty Acids (FISH OIL) 1200 MG CAPS Take 1,200 mg by mouth daily.   RABEprazole (ACIPHEX) 20 MG tablet Take 20 mg by mouth daily.   rosuvastatin (CRESTOR) 5 MG tablet TAKE ONE TABLET 3 TIMES A WEEK   Turmeric Curcumin 500 MG CAPS Take 500 mg by mouth daily.   [DISCONTINUED] ondansetron (ZOFRAN) 4 MG tablet Take 1 tablet (4 mg total) by mouth every 8 (eight) hours as needed for nausea or vomiting.   [DISCONTINUED] RESTASIS 0.05 % ophthalmic emulsion Place 1 drop into both eyes 2 (two) times daily.   No facility-administered encounter medications on file as of 04/13/2021.     Lab Results  Component Value Date   WBC 4.4 11/12/2020   HGB 12.8 11/12/2020   HCT 37.5 11/12/2020   PLT  244.0 11/12/2020   GLUCOSE 127 (H) 04/09/2021   CHOL 154 04/09/2021   TRIG 59.0 04/09/2021   HDL 71.40 04/09/2021   LDLDIRECT 114.0 12/17/2018   LDLCALC 71 04/09/2021   ALT 24 04/09/2021   AST 17 04/09/2021   NA 138 04/09/2021   K 3.7 04/09/2021   CL 103 04/09/2021   CREATININE 0.75 04/09/2021   BUN 14 04/09/2021   CO2 25 04/09/2021   TSH 0.42 11/12/2020   HGBA1C 5.6 12/31/2014       Assessment & Plan:   Problem List Items Addressed This Visit     Fibromuscular dysplasia ([place])    Evaluated by [facility].  Stable.  Recommended f/u prn       GERD (gastroesophageal reflux disease)    Continues on aciphex.  Appears to be stable.       Health care maintenance    Physical today 04/13/21.  Mammogram 10/12/20 - Birads I.  Colonoscopy 09/2016.   F/u colonoscopy 5 years.        Hyperglycemia    Elevated fasting sugar on recent lab.  Discussed.  Recheck fasting sugar to confirm wnl.       Relevant Orders   Glucose, fasting   Hyperlipidemia    Continue crestor.  Low cholesterol diet and exercise.  Follow lipid panel.       Hypertension    Continue losartan and amlodipine.  Follow pressures.  Follow metabolic panel.       IBS (irritable bowel syndrome)    Saw GI 01/2021.  Due f/u colonoscopy next year.       Lipoma of back    Previously had to cancel surgery appt.  Will notify me if desires referral back to surgery.       Primary hyperparathyroidism ([place])    Worked up by endocrinology.  Follow calcium level.       Renal artery stenosis ([facility])    Has been evaluated by [facility].  Follow renal function.       Rheumatoid arthritis ([place])    Followed by rheumatology.  On MTX.  Appears to be stable. Check cbc with next labs.  Not performed with latest labs.       Relevant Orders   CBC with Differential/Platelet     Einar Pheasant, MD

## 2021-04-13 NOTE — Assessment & Plan Note (Signed)
Physical today 04/13/21.  Mammogram 10/12/20 - Birads I.  Colonoscopy 09/2016.  F/u colonoscopy 5 years.

## 2021-04-14 ENCOUNTER — Encounter: Payer: Self-pay | Admitting: Internal Medicine

## 2021-04-14 DIAGNOSIS — D179 Benign lipomatous neoplasm, unspecified: Secondary | ICD-10-CM

## 2021-04-14 NOTE — Telephone Encounter (Signed)
Order placed for surgery referral.  

## 2021-04-16 ENCOUNTER — Encounter: Payer: Self-pay | Admitting: Internal Medicine

## 2021-04-18 ENCOUNTER — Encounter: Payer: Self-pay | Admitting: Internal Medicine

## 2021-04-18 DIAGNOSIS — R739 Hyperglycemia, unspecified: Secondary | ICD-10-CM | POA: Insufficient documentation

## 2021-04-18 NOTE — Assessment & Plan Note (Signed)
Evaluated by [facility].  Stable.  Recommended f/u prn.  

## 2021-04-18 NOTE — Assessment & Plan Note (Signed)
Previously had to cancel surgery appt.  Will notify me if desires referral back to surgery.

## 2021-04-18 NOTE — Assessment & Plan Note (Signed)
Saw GI 01/2021.  Due f/u colonoscopy next year.

## 2021-04-18 NOTE — Assessment & Plan Note (Signed)
Elevated fasting sugar on recent lab.  Discussed.  Recheck fasting sugar to confirm wnl.

## 2021-04-18 NOTE — Assessment & Plan Note (Signed)
Has been evaluated by [facility].  Follow renal function.  

## 2021-04-18 NOTE — Assessment & Plan Note (Signed)
Continue crestor.  Low cholesterol diet and exercise.  Follow lipid panel.  

## 2021-04-18 NOTE — Assessment & Plan Note (Signed)
Followed by rheumatology.  On MTX.  Appears to be stable. Check cbc with next labs.  Not performed with latest labs.

## 2021-04-18 NOTE — Assessment & Plan Note (Signed)
Continues on aciphex.  Appears to be stable.  

## 2021-04-18 NOTE — Assessment & Plan Note (Signed)
Continue losartan and amlodipine.  Follow pressures.  Follow metabolic panel.  

## 2021-04-18 NOTE — Assessment & Plan Note (Signed)
Worked up by endocrinology.  Follow calcium level.  

## 2021-04-20 DIAGNOSIS — M25561 Pain in right knee: Secondary | ICD-10-CM | POA: Diagnosis not present

## 2021-04-22 DIAGNOSIS — M1711 Unilateral primary osteoarthritis, right knee: Secondary | ICD-10-CM | POA: Diagnosis not present

## 2021-04-22 DIAGNOSIS — M25561 Pain in right knee: Secondary | ICD-10-CM | POA: Diagnosis not present

## 2021-04-25 DIAGNOSIS — Z20828 Contact with and (suspected) exposure to other viral communicable diseases: Secondary | ICD-10-CM | POA: Diagnosis not present

## 2021-04-27 ENCOUNTER — Encounter: Payer: Self-pay | Admitting: Internal Medicine

## 2021-04-27 NOTE — Telephone Encounter (Signed)
Ok to schedule for new pt.

## 2021-05-05 ENCOUNTER — Other Ambulatory Visit: Payer: Self-pay | Admitting: Internal Medicine

## 2021-05-06 ENCOUNTER — Other Ambulatory Visit: Payer: Self-pay | Admitting: Internal Medicine

## 2021-05-06 NOTE — Telephone Encounter (Signed)
Last OV 04/13/21 okay to fill Flexeril?

## 2021-05-06 NOTE — Telephone Encounter (Signed)
Received request for flexeril refill.  Notify Victoria Moss that I refilled her flexeril.  Please confirm she is doing ok.

## 2021-05-19 DIAGNOSIS — Z20822 Contact with and (suspected) exposure to covid-19: Secondary | ICD-10-CM | POA: Diagnosis not present

## 2021-05-19 DIAGNOSIS — M25561 Pain in right knee: Secondary | ICD-10-CM | POA: Diagnosis not present

## 2021-05-31 ENCOUNTER — Other Ambulatory Visit (INDEPENDENT_AMBULATORY_CARE_PROVIDER_SITE_OTHER): Payer: Medicare Other

## 2021-05-31 ENCOUNTER — Other Ambulatory Visit: Payer: Self-pay

## 2021-05-31 DIAGNOSIS — M069 Rheumatoid arthritis, unspecified: Secondary | ICD-10-CM

## 2021-05-31 DIAGNOSIS — R739 Hyperglycemia, unspecified: Secondary | ICD-10-CM | POA: Diagnosis not present

## 2021-05-31 LAB — CBC WITH DIFFERENTIAL/PLATELET
Basophils Absolute: 0 10*3/uL (ref 0.0–0.1)
Basophils Relative: 0.6 % (ref 0.0–3.0)
Eosinophils Absolute: 0.2 10*3/uL (ref 0.0–0.7)
Eosinophils Relative: 4.7 % (ref 0.0–5.0)
HCT: 39.4 % (ref 36.0–46.0)
Hemoglobin: 13.1 g/dL (ref 12.0–15.0)
Lymphocytes Relative: 19.9 % (ref 12.0–46.0)
Lymphs Abs: 0.9 10*3/uL (ref 0.7–4.0)
MCHC: 33.3 g/dL (ref 30.0–36.0)
MCV: 98.2 fl (ref 78.0–100.0)
Monocytes Absolute: 0.3 10*3/uL (ref 0.1–1.0)
Monocytes Relative: 6.4 % (ref 3.0–12.0)
Neutro Abs: 3.2 10*3/uL (ref 1.4–7.7)
Neutrophils Relative %: 68.4 % (ref 43.0–77.0)
Platelets: 271 10*3/uL (ref 150.0–400.0)
RBC: 4.01 Mil/uL (ref 3.87–5.11)
RDW: 13.5 % (ref 11.5–15.5)
WBC: 4.7 10*3/uL (ref 4.0–10.5)

## 2021-05-31 LAB — GLUCOSE, FASTING: Glucose, Bld: 79 mg/dL (ref 65–99)

## 2021-06-01 ENCOUNTER — Other Ambulatory Visit: Payer: Self-pay | Admitting: Internal Medicine

## 2021-06-01 DIAGNOSIS — K582 Mixed irritable bowel syndrome: Secondary | ICD-10-CM | POA: Diagnosis not present

## 2021-06-01 DIAGNOSIS — Z8371 Family history of colonic polyps: Secondary | ICD-10-CM | POA: Diagnosis not present

## 2021-06-01 DIAGNOSIS — K2100 Gastro-esophageal reflux disease with esophagitis, without bleeding: Secondary | ICD-10-CM | POA: Diagnosis not present

## 2021-06-16 DIAGNOSIS — M25561 Pain in right knee: Secondary | ICD-10-CM | POA: Diagnosis not present

## 2021-06-18 ENCOUNTER — Other Ambulatory Visit: Payer: Self-pay | Admitting: General Surgery

## 2021-06-18 DIAGNOSIS — R222 Localized swelling, mass and lump, trunk: Secondary | ICD-10-CM | POA: Diagnosis not present

## 2021-06-18 DIAGNOSIS — D849 Immunodeficiency, unspecified: Secondary | ICD-10-CM | POA: Diagnosis not present

## 2021-06-18 DIAGNOSIS — Z20822 Contact with and (suspected) exposure to covid-19: Secondary | ICD-10-CM | POA: Diagnosis not present

## 2021-06-27 DIAGNOSIS — Z20822 Contact with and (suspected) exposure to covid-19: Secondary | ICD-10-CM | POA: Diagnosis not present

## 2021-06-29 DIAGNOSIS — Z20822 Contact with and (suspected) exposure to covid-19: Secondary | ICD-10-CM | POA: Diagnosis not present

## 2021-07-08 ENCOUNTER — Other Ambulatory Visit: Payer: Self-pay

## 2021-07-08 ENCOUNTER — Encounter: Payer: Self-pay | Admitting: Surgery

## 2021-07-08 ENCOUNTER — Ambulatory Visit (INDEPENDENT_AMBULATORY_CARE_PROVIDER_SITE_OTHER): Payer: Medicare Other | Admitting: Surgery

## 2021-07-08 ENCOUNTER — Ambulatory Visit: Payer: Self-pay | Admitting: Surgery

## 2021-07-08 VITALS — BP 118/77 | HR 66 | Temp 98.1°F | Ht 65.0 in | Wt 148.0 lb

## 2021-07-08 DIAGNOSIS — R222 Localized swelling, mass and lump, trunk: Secondary | ICD-10-CM | POA: Diagnosis not present

## 2021-07-08 DIAGNOSIS — D4989 Neoplasm of unspecified behavior of other specified sites: Secondary | ICD-10-CM

## 2021-07-08 NOTE — Progress Notes (Signed)
Patient ID: Victoria Moss, female   DOB: 05/12/1950, 71 y.o.   MRN: [id number]  Chief Complaint: Left lumbar/infrascapular soft tissue mass  History of Present Illness Victoria Moss is a 71 y.o. female with a recurrent left lumbar/infrascapular soft tissue mass.  Prior excisions done in the office and in hospital, with recurrence.  Has been seen by multiple surgeons since 2020 regarding reexcision.  Apparently for 1 reason or another its been deferred, and she presents to [place] today pursuing excisional biopsy with out utilization of drains.    Past Medical History Past Medical History:  Diagnosis Date   Allergy    Arthritis    Basal cell cancer    COVID-19 10/2020   Diverticulosis    H/O   GERD (gastroesophageal reflux disease)    Heart murmur    Hypertension    IBS (irritable bowel syndrome)    Migraine    H/O, none since stopping chocolate   Osteoporosis    PONV (postoperative nausea and vomiting)    Rheumatic fever    H/O   Shingles 08/2020   Status post dilation of esophageal narrowing       Past Surgical History:  Procedure Laterality Date   APPENDECTOMY  1982   BREAST BIOPSY Right 09/2014   axillary node- neg   CATARACT EXTRACTION, BILATERAL     CESAREAN SECTION     COLONOSCOPY  2018   DILATION AND CURETTAGE OF UTERUS     x2   DILATION AND CURETTAGE, DIAGNOSTIC / THERAPEUTIC     x 2   fatty tumor     on back x2   HERNIA REPAIR     KNEE SURGERY Right    MYRINGOTOMY WITH TUBE PLACEMENT Bilateral 03/24/2021   Procedure: MYRINGOTOMY WITH TUBE PLACEMENT;  Surgeon: Bud Face, MD;  Location: [facility];  Service: ENT;  Laterality: Bilateral;   parathyroid gland one removed     PARATHYROIDECTOMY     SKIN CANCER EXCISION     BCCA    TOE SURGERY Left    joint replacement bit toe   TUBAL LIGATION     UMBILICAL HERNIA REPAIR     UPPER GI ENDOSCOPY      Allergies  Allergen Reactions   Codeine Itching   Lac Bovis Diarrhea     Abdominal Pain   Penicillins Itching    Did it involve swelling of the face/tongue/throat, SOB, or low BP? No Did it involve sudden or severe rash/hives, skin peeling, or any reaction on the inside of your mouth or nose? No Did you need to seek medical attention at a hospital or doctor's office? No When did it last happen?     40 + years ago  If all above answers are NO, may proceed with cephalosporin use.     Current Outpatient Medications  Medication Sig Dispense Refill   acetaminophen (TYLENOL) 500 MG tablet Take 1,000 mg by mouth 2 (two) times daily as needed for moderate pain or headache.     amLODipine (NORVASC) 5 MG tablet TAKE ONE TABLET BY MOUTH EVERY DAY 30 tablet 5   Calcium Carb-Cholecalciferol (CALCIUM 600+D) 600-800 MG-UNIT TABS Take 1 tablet by mouth daily.     cyclobenzaprine (FLEXERIL) 5 MG tablet TAKE ONE TABLET AT BEDTIME AS NEEDED FORMUSCLE SPASM 30 tablet 0   dicyclomine (BENTYL) 10 MG capsule TAKE 1 CAPSULE BY MOUTH ONCE DAILY AS NEEDED AS DIRECTED BY PHYSICIAN (Patient taking differently: Take 10 mg by mouth daily as needed  for spasms.) 90 capsule 1   famotidine (PEPCID) 20 MG tablet Take 20 mg by mouth every evening.     folic acid (FOLVITE) 1 MG tablet Take 1 mg by mouth daily.     losartan (COZAAR) 100 MG tablet TAKE 1 TABLET BY MOUTH EVERY DAY 30 tablet 3   Melatonin 5 MG CAPS Take 5 mg by mouth at bedtime.     methotrexate (RHEUMATREX) 2.5 MG tablet Take 15 mg by mouth every Friday.     Multiple Vitamin (MULTI-VITAMINS) TABS Take 1 tablet by mouth daily.      Omega-3 Fatty Acids (FISH OIL) 1200 MG CAPS Take 1,200 mg by mouth daily.     RABEprazole (ACIPHEX) 20 MG tablet Take 20 mg by mouth daily.     rosuvastatin (CRESTOR) 5 MG tablet TAKE ONE TABLET 3 TIMES A WEEK 15 tablet 5   Turmeric Curcumin 500 MG CAPS Take 500 mg by mouth daily.     No current facility-administered medications for this visit.    Family History Family History  Problem Relation Age  of Onset   Breast cancer Mother 47   Hypertension Mother    Stroke Mother        Late 72   Arthritis Mother    Colon polyps Mother    Cancer Mother        Breast   Hypertension Father    Hyperlipidemia Father    Dementia Father    COPD Father    Congestive Heart Failure Father    Diabetes Neg Hx    Colon cancer Neg Hx    Esophageal cancer Neg Hx    Kidney disease Neg Hx    Gallbladder disease Neg Hx       Social History Social History   Tobacco Use   Smoking status: Former    Years: 7.00    Types: Cigarettes    Quit date: 05/09/1973    Years since quitting: 48.1   Smokeless tobacco: Never   Tobacco comments:    Quit at age 89  Vaping Use   Vaping Use: Never used  Substance Use Topics   Alcohol use: No    Alcohol/week: 0.0 standard drinks   Drug use: No      Review of Systems  Constitutional: Negative.   HENT: Negative.    Eyes: Negative.   Respiratory: Negative.    Cardiovascular: Negative.   Gastrointestinal:  Positive for heartburn.  Genitourinary: Negative.   Skin: Negative.   Neurological: Negative.   Psychiatric/Behavioral: Negative.       Physical Exam Blood pressure 118/77, pulse 66, temperature 98.1 F (36.7 C), temperature source Oral, height 5\' 5"  (1.651 m), weight 148 lb (67.1 kg), last menstrual period 05/09/2006. Last Weight  Most recent update: 07/08/2021  1:59 PM    Weight  67.1 kg (148 lb)             CONSTITUTIONAL: Well developed, and nourished, appropriately responsive and aware without distress.   EYES: Sclera non-icteric.   EARS, NOSE, MOUTH AND THROAT: Mask worn.    Hearing is intact to voice.  NECK: Trachea is midline, and there is no jugular venous distension.  LYMPH NODES:  Lymph nodes in the neck are not enlarged. RESPIRATORY:  Lungs are clear, and breath sounds are equal bilaterally. Normal respiratory effort without pathologic use of accessory muscles. CARDIOVASCULAR: Heart is regular in rate and rhythm. GI: The  abdomen is  soft, nontender, and nondistended. There were no palpable masses.  I did not appreciate hepatosplenomegaly.  MUSCULOSKELETAL:  Symmetrical muscle tone appreciated in all four extremities.    SKIN: Skin turgor is normal. No pathologic skin lesions appreciated.  There are crisscrossing scars of her left lumbar back overlying a soft tissue mass of about 10 cm in size.  There is a smaller scar just inferior to this also well-healed.  Soft tissue mass is a fairly mobile, wide-based and protruding sufficiently provide patient concern regarding appearance, and bumping it against chair backs etc. NEUROLOGIC:  Motor and sensation appear grossly normal.  Cranial nerves are grossly without defect. PSYCH:  Alert and oriented to person, place and time. Affect is appropriate for situation.  Data Reviewed I have personally reviewed what is currently available of the patient's imaging, recent labs and medical records.   Labs:  CBC Latest Ref Rng & Units 05/31/2021 11/12/2020 09/26/2019  WBC 4.0 - 10.5 K/uL 4.7 4.4 -  Hemoglobin 12.0 - 15.0 g/dL 16.1 09.6 04.5  Hematocrit 36.0 - 46.0 % 39.4 37.5 -  Platelets 150.0 - 400.0 K/uL 271.0 244.0 -   CMP Latest Ref Rng & Units 05/31/2021 04/09/2021 11/12/2020  Glucose 65 - 99 mg/dL 79 409(W) 85  BUN 6 - 23 mg/dL - 14 12  Creatinine 1.19 - 1.20 mg/dL - 1.47 8.29  Sodium 562 - 145 mEq/L - 138 140  Potassium 3.5 - 5.1 mEq/L - 3.7 4.0  Chloride 96 - 112 mEq/L - 103 102  CO2 19 - 32 mEq/L - 25 26  Calcium 8.4 - 10.5 mg/dL - 9.7 9.7  Total Protein 6.0 - 8.3 g/dL - 7.5 6.6  Total Bilirubin 0.2 - 1.2 mg/dL - 0.5 0.7  Alkaline Phos 39 - 117 U/L - 67 63  AST 0 - 37 U/L - 17 19  ALT 0 - 35 U/L - 24 23      Imaging:  Within last 24 hrs: No results found.  Assessment    Recurrent soft tissue mass right lumbar back area.  Currently taking methotrexate. Patient Active Problem List   Diagnosis Date Noted   Hyperglycemia 04/18/2021   Right knee pain 01/17/2021    Rheumatoid arthritis (HCC) 10/05/2020   Right low back pain 08/03/2020   Hyperlipidemia 02/24/2020   Vaginal burning 02/15/2020   Sleep difficulties 01/11/2020   Urinary frequency 04/21/2019   Constipation 12/23/2018   Lipoma of back 09/02/2017   Long-term use of Plaquenil 06/12/2017   PVD (posterior vitreous detachment), bilateral 06/12/2017   Severe myopia of both eyes 06/12/2017   Erosive gastritis 06/07/2017   Hypertension 10/22/2016   Closed nondisplaced fracture of styloid process of right ulna 08/11/2016   Ganglion cyst of both wrists 06/24/2016   Carotid stenosis 03/21/2016   Renal artery stenosis (HCC) 03/21/2016   Generalized anxiety disorder 12/17/2015   Neck pain 11/27/2015   Primary hyperparathyroidism (HCC) 02/06/2015   Fibromuscular dysplasia (HCC) 12/03/2014   Insomnia 10/27/2014   Health care maintenance 08/10/2014   Family history of colonic polyps 07/02/2013   Chronic inflammatory arthritis 12/23/2012   IBS (irritable bowel syndrome) 12/23/2012   Diverticulosis 12/23/2012   GERD (gastroesophageal reflux disease) 12/23/2012   Environmental allergies 12/23/2012   Migraine 12/23/2012   Osteopenia     Plan    We will hold methotrexate for 2 weeks.  I believe we can proceed with excision of this lesion under general anesthesia and provide a layered closure and avoid the drains she hopes to prevent. Risks of the procedure include anesthesia, bleeding, recurrence,  seroma/hematoma, infection etc.  Believe she understands these procedures are not all-inclusive, and has had her questions adequately answered.  No guarantees were ever expressed or implied.  Face-to-face time spent with the patient and accompanying care providers(if present) was 30 minutes, with more than 50% of the time spent counseling, educating, and coordinating care of the patient.    These notes generated with voice recognition software. I apologize for typographical errors.  Campbell Lerner  M.D., FACS 07/08/2021, 2:36 PM

## 2021-07-08 NOTE — Patient Instructions (Addendum)
Please HOLD Methotrexate for 2 weeks prior to surgery. ? ?Our surgery scheduler Pamala Hurry will call you within 24-48 hours to get you scheduled. If you have not heard from her after 48 hours, please call our office. You will not need to get Covid tested before surgery and have the blue sheet available when she calls to write down important information. ? ? ?If you have any concerns or questions, please feel free to call our office.  ? ?Lipoma Removal ?Lipoma removal is a surgical procedure to remove a lipoma, which is a noncancerous (benign) tumor that is made up of fat cells. Most lipomas are small and painless and do not require treatment. They can form in many areas of the body but are most common under the skin of the back, arms, shoulders, buttocks, and thighs. ?You may need lipoma removal if you have a lipoma that is large, growing, or causing discomfort. Lipoma removal may also be done for cosmetic reasons. ?Tell a health care provider about: ?Any allergies you have. ?All medicines you are taking, including vitamins, herbs, eye drops, creams, and over-the-counter medicines. ?Any problems you or family members have had with anesthetic medicines. ?Any blood disorders you have. ?Any surgeries you have had. ?Any medical conditions you have. ?Whether you are pregnant or may be pregnant. ?What are the risks? ?Generally, this is a safe procedure. However, problems may occur, including: ?Infection. ?Bleeding. ?Scarring. ?Allergic reactions to medicines. ?Damage to nearby structures or organs, such as damage to nerves or blood vessels near the lipoma. ?What happens before the procedure? ?Staying hydrated ?Follow instructions from your health care provider about hydration, which may include: ?Up to 2 hours before the procedure - you may continue to drink clear liquids, such as water, clear fruit juice, black coffee, and plain tea. ?Eating and drinking restrictions ?Follow instructions from your health care provider about  eating and drinking, which may include: ?8 hours before the procedure - stop eating heavy meals or foods, such as meat, fried foods, or fatty foods. ?6 hours before the procedure - stop eating light meals or foods, such as toast or cereal. ?6 hours before the procedure - stop drinking milk or drinks that contain milk. ?2 hours before the procedure - stop drinking clear liquids. ?Medicines ?Ask your health care provider about: ?Changing or stopping your regular medicines. This is especially important if you are taking diabetes medicines or blood thinners. ?Taking medicines such as aspirin and ibuprofen. These medicines can thin your blood. Do not take these medicines unless your health care provider tells you to take them. ?Taking over-the-counter medicines, vitamins, herbs, and supplements. ?General instructions ?You will have a physical exam. Your health care provider will check the size of the lipoma and whether it can be moved easily. ?You may have a biopsy and imaging tests, such as X-rays, a CT scan, and an MRI. ?Do not use any products that contain nicotine or tobacco for at least 4 weeks before the procedure. These products include cigarettes, e-cigarettes, and chewing tobacco. If you need help quitting, ask your health care provider. ?Ask your health care provider: ?How your surgery site will be marked. ?What steps will be taken to help prevent infection. These may include: ?Washing skin with a germ-killing soap. ?Taking antibiotic medicine. ?Plan to have someone take you home from the hospital or clinic. ?If you will be going home right after the procedure, plan to have someone with you for 24 hours. ?What happens during the procedure? ? ?An IV  will be inserted into one of your veins. ?You will be given one or more of the following: ?A medicine to help you relax (sedative). ?A medicine to numb the area (local anesthetic). ?A medicine to make you fall asleep (general anesthetic). ?A medicine that is injected  into an area of your body to numb everything below the injection site (regional anesthetic). ?An incision will be made over the lipoma or very near the lipoma. The incision may be made in a natural skin line or crease. ?Tissues, nerves, and blood vessels near the lipoma will be moved out of the way. ?The lipoma and the capsule that surrounds it will be separated from the surrounding tissues. ?The lipoma will be removed. ?The incision may be closed with stitches (sutures). ?A bandage (dressing) will be placed over the incision. ?The procedure may vary among health care providers and hospitals. ?What happens after the procedure? ?Your blood pressure, heart rate, breathing rate, and blood oxygen level will be monitored until you leave the hospital or clinic. ?If you were prescribed an antibiotic medicine, use it as told by your health care provider. Do not stop using the antibiotic even if you start to feel better. ?If you were given a sedative during the procedure, it can affect you for several hours. Do not drive or operate machinery until your health care provider says that it is safe. ?Summary ?Before the procedure, follow instructions from your health care provider about eating and drinking, and changing or stopping your regular medicines. This is especially important if you are taking diabetes medicines or blood thinners. ?After the lipoma is removed, the incision may be closed with stitches (sutures) and covered with a bandage (dressing). ?If you were given a sedative during the procedure, it can affect you for several hours. Do not drive or operate machinery until your health care provider says that it is safe. ?This information is not intended to replace advice given to you by your health care provider. Make sure you discuss any questions you have with your health care provider. ?Document Revised: 12/10/2018 Document Reviewed: 12/10/2018 ?Elsevier Patient Education ? [place]. ? ?

## 2021-07-08 NOTE — H&P (View-Only) (Signed)
Patient ID: Victoria Moss, female   DOB: [birth year], 71 y.o.   MRN: [id number] ? ?Chief Complaint: Left lumbar/infrascapular soft tissue mass ? ?History of Present Illness ?Victoria Moss is a 71 y.o. female with a recurrent left lumbar/infrascapular soft tissue mass.  Prior excisions done in the office and in hospital, with recurrence.  Has been seen by multiple surgeons since 2020 regarding reexcision.  Apparently for 1 reason or another its been deferred, and she presents to [place] today pursuing excisional biopsy with out utilization of drains.   ? ?Past Medical History ?Past Medical History:  ?Diagnosis Date  ? Allergy   ? Arthritis   ? Basal cell cancer   ? COVID-19 10/2020  ? Diverticulosis   ? H/O  ? GERD (gastroesophageal reflux disease)   ? Heart murmur   ? Hypertension   ? IBS (irritable bowel syndrome)   ? Migraine   ? H/O, none since stopping chocolate  ? Osteoporosis   ? PONV (postoperative nausea and vomiting)   ? Rheumatic fever   ? H/O  ? Shingles 08/2020  ? Status post dilation of esophageal narrowing   ?  ? ? ?Past Surgical History:  ?Procedure Laterality Date  ? APPENDECTOMY  1982  ? BREAST BIOPSY Right 09/2014  ? axillary node- neg  ? CATARACT EXTRACTION, BILATERAL    ? CESAREAN SECTION    ? COLONOSCOPY  2018  ? DILATION AND CURETTAGE OF UTERUS    ? x2  ? DILATION AND CURETTAGE, DIAGNOSTIC / THERAPEUTIC    ? x 2  ? fatty tumor    ? on back x2  ? HERNIA REPAIR    ? KNEE SURGERY Right   ? MYRINGOTOMY WITH TUBE PLACEMENT Bilateral 03/24/2021  ? Procedure: MYRINGOTOMY WITH TUBE PLACEMENT;  Surgeon: Carloyn Manner, MD;  Location: [facility];  Service: ENT;  Laterality: Bilateral;  ? parathyroid gland one removed    ? PARATHYROIDECTOMY    ? SKIN CANCER EXCISION    ? BCCA   ? TOE SURGERY Left   ? joint replacement bit toe  ? TUBAL LIGATION    ? UMBILICAL HERNIA REPAIR    ? UPPER GI ENDOSCOPY    ? ? ?Allergies  ?Allergen Reactions  ? Codeine Itching  ? [place] Diarrhea  ?   Abdominal Pain  ? Penicillins Itching  ?  Did it involve swelling of the face/tongue/throat, SOB, or low BP? No ?Did it involve sudden or severe rash/hives, skin peeling, or any reaction on the inside of your mouth or nose? No ?Did you need to seek medical attention at a hospital or doctor's office? No ?When did it last happen?     40 + years ago  ?If all above answers are ?NO?, may proceed with cephalosporin use. ?  ? ? ?Current Outpatient Medications  ?Medication Sig Dispense Refill  ? acetaminophen (TYLENOL) 500 MG tablet Take 1,000 mg by mouth 2 (two) times daily as needed for moderate pain or headache.    ? amLODipine (NORVASC) 5 MG tablet TAKE ONE TABLET BY MOUTH EVERY DAY 30 tablet 5  ? Calcium Carb-Cholecalciferol (CALCIUM 600+D) 600-800 MG-UNIT TABS Take 1 tablet by mouth daily.    ? cyclobenzaprine (FLEXERIL) 5 MG tablet TAKE ONE TABLET AT BEDTIME AS NEEDED FORMUSCLE SPASM 30 tablet 0  ? dicyclomine (BENTYL) 10 MG capsule TAKE 1 CAPSULE BY MOUTH ONCE DAILY AS NEEDED AS DIRECTED BY PHYSICIAN (Patient taking differently: Take 10 mg by mouth daily as needed  for spasms.) 90 capsule 1  ? famotidine (PEPCID) 20 MG tablet Take 20 mg by mouth every evening.    ? folic acid (FOLVITE) 1 MG tablet Take 1 mg by mouth daily.    ? losartan (COZAAR) 100 MG tablet TAKE 1 TABLET BY MOUTH EVERY DAY 30 tablet 3  ? Melatonin 5 MG CAPS Take 5 mg by mouth at bedtime.    ? methotrexate (RHEUMATREX) 2.5 MG tablet Take 15 mg by mouth every Friday.    ? Multiple Vitamin (MULTI-VITAMINS) TABS Take 1 tablet by mouth daily.     ? Omega-3 Fatty Acids (FISH OIL) 1200 MG CAPS Take 1,200 mg by mouth daily.    ? RABEprazole (ACIPHEX) 20 MG tablet Take 20 mg by mouth daily.    ? rosuvastatin (CRESTOR) 5 MG tablet TAKE ONE TABLET 3 TIMES A WEEK 15 tablet 5  ? Turmeric Curcumin 500 MG CAPS Take 500 mg by mouth daily.    ? ?No current facility-administered medications for this visit.  ? ? ?Family History ?Family History  ?Problem Relation Age  of Onset  ? Breast cancer Mother 76  ? Hypertension Mother   ? Stroke Mother   ?     Late 33  ? Arthritis Mother   ? Colon polyps Mother   ? Cancer Mother   ?     Breast  ? Hypertension Father   ? Hyperlipidemia Father   ? Dementia Father   ? COPD Father   ? Congestive Heart Failure Father   ? Diabetes Neg Hx   ? Colon cancer Neg Hx   ? Esophageal cancer Neg Hx   ? Kidney disease Neg Hx   ? Gallbladder disease Neg Hx   ?  ? ? ?Social History ?Social History  ? ?Tobacco Use  ? Smoking status: Former  ?  Years: 7.00  ?  Types: Cigarettes  ?  Quit date: 05/09/1973  ?  Years since quitting: 48.1  ? Smokeless tobacco: Never  ? Tobacco comments:  ?  Quit at age 34  ?Vaping Use  ? Vaping Use: Never used  ?Substance Use Topics  ? Alcohol use: No  ?  Alcohol/week: 0.0 standard drinks  ? Drug use: No  ?  ? ? ?Review of Systems  ?Constitutional: Negative.   ?HENT: Negative.    ?Eyes: Negative.   ?Respiratory: Negative.    ?Cardiovascular: Negative.   ?Gastrointestinal:  Positive for heartburn.  ?Genitourinary: Negative.   ?Skin: Negative.   ?Neurological: Negative.   ?Psychiatric/Behavioral: Negative.    ?  ? ?Physical Exam ?Blood pressure 118/77, pulse 66, temperature 98.1 ?F (36.7 ?C), temperature source Oral, height 5\' 5"  (1.651 m), weight 148 lb (67.1 kg), last menstrual period 05/09/2006. ?Last Weight  Most recent update: 07/08/2021  1:59 PM  ? ? Weight  ?67.1 kg (148 lb)  ?      ? ?  ? ? ?CONSTITUTIONAL: Well developed, and nourished, appropriately responsive and aware without distress.   ?EYES: Sclera non-icteric.   ?EARS, NOSE, MOUTH AND THROAT: Mask worn.    Hearing is intact to voice.  ?NECK: Trachea is midline, and there is no jugular venous distension.  ?LYMPH NODES:  Lymph nodes in the neck are not enlarged. ?RESPIRATORY:  Lungs are clear, and breath sounds are equal bilaterally. Normal respiratory effort without pathologic use of accessory muscles. ?CARDIOVASCULAR: Heart is regular in rate and rhythm. ?GI: The  abdomen is  soft, nontender, and nondistended. There were no palpable masses.  I did not appreciate hepatosplenomegaly.  ?MUSCULOSKELETAL:  Symmetrical muscle tone appreciated in all four extremities.    ?SKIN: Skin turgor is normal. No pathologic skin lesions appreciated.  ?There are crisscrossing scars of her left lumbar back overlying a soft tissue mass of about 10 cm in size.  There is a smaller scar just inferior to this also well-healed.  Soft tissue mass is a fairly mobile, wide-based and protruding sufficiently provide patient concern regarding appearance, and bumping it against chair backs etc. ?NEUROLOGIC:  Motor and sensation appear grossly normal.  Cranial nerves are grossly without defect. ?PSYCH:  Alert and oriented to person, place and time. Affect is appropriate for situation. ? ?Data Reviewed ?I have personally reviewed what is currently available of the patient's imaging, recent labs and medical records.   ?Labs:  ?CBC Latest Ref Rng & Units 05/31/2021 11/12/2020 09/26/2019  ?WBC 4.0 - 10.5 K/uL 4.7 4.4 -  ?Hemoglobin 12.0 - 15.0 g/dL 13.1 12.8 12.6  ?Hematocrit 36.0 - 46.0 % 39.4 37.5 -  ?Platelets 150.0 - 400.0 K/uL 271.0 244.0 -  ? ?CMP Latest Ref Rng & Units 05/31/2021 04/09/2021 11/12/2020  ?Glucose 65 - 99 mg/dL 79 127(H) 85  ?BUN 6 - 23 mg/dL - 14 12  ?Creatinine 0.40 - 1.20 mg/dL - 0.75 0.77  ?Sodium 135 - 145 mEq/L - 138 140  ?Potassium 3.5 - 5.1 mEq/L - 3.7 4.0  ?Chloride 96 - 112 mEq/L - 103 102  ?CO2 19 - 32 mEq/L - 25 26  ?Calcium 8.4 - 10.5 mg/dL - 9.7 9.7  ?Total Protein 6.0 - 8.3 g/dL - 7.5 6.6  ?Total Bilirubin 0.2 - 1.2 mg/dL - 0.5 0.7  ?Alkaline Phos 39 - 117 U/L - 67 63  ?AST 0 - 37 U/L - 17 19  ?ALT 0 - 35 U/L - 24 23  ? ? ? ? ?Imaging: ? ?Within last 24 hrs: No results found. ? ?Assessment ?   ?Recurrent soft tissue mass right lumbar back area.  Currently taking methotrexate. ?Patient Active Problem List  ? Diagnosis Date Noted  ? Hyperglycemia 04/18/2021  ? Right knee pain 01/17/2021   ? Rheumatoid arthritis ([place]) 10/05/2020  ? Right low back pain 08/03/2020  ? Hyperlipidemia 02/24/2020  ? Vaginal burning 02/15/2020  ? Sleep difficulties 01/11/2020  ? Urinary frequency 04/21/2019  ? Constipa

## 2021-07-09 ENCOUNTER — Telehealth: Payer: Self-pay | Admitting: Surgery

## 2021-07-09 NOTE — Telephone Encounter (Signed)
Patient has been advised of Pre-Admission date/time, COVID Testing date and Surgery date. ? ?Surgery Date: 07/30/21 ?Preadmission Testing Date: 07/22/21 (phone 8a-1p) ?Covid Testing Date: Not needed.    ? ?Patient has been made aware to call [phone], between 1-3:00pm the day before surgery, to find out what time to arrive for surgery.   ? ?

## 2021-07-15 DIAGNOSIS — M67861 Other specified disorders of synovium, right knee: Secondary | ICD-10-CM | POA: Diagnosis not present

## 2021-07-15 DIAGNOSIS — M25461 Effusion, right knee: Secondary | ICD-10-CM | POA: Diagnosis not present

## 2021-07-15 DIAGNOSIS — M25561 Pain in right knee: Secondary | ICD-10-CM | POA: Diagnosis not present

## 2021-07-22 ENCOUNTER — Encounter
Admission: RE | Admit: 2021-07-22 | Discharge: 2021-07-22 | Disposition: A | Discharge status: Home / Self Care | Payer: Medicare Other | Source: Ambulatory Visit | Attending: Surgery | Admitting: Surgery

## 2021-07-22 ENCOUNTER — Other Ambulatory Visit: Payer: Self-pay

## 2021-07-22 NOTE — Patient Instructions (Addendum)
Your procedure is scheduled on: Friday 07/30/21 ?Report to the Registration Desk on the 1st floor of the [facility]. ?To find out your arrival time, please call [phone] between 1PM - 3PM on: Thursday 07/29/21 ? ?REMEMBER: ?Instructions that are not followed completely may result in serious medical risk, up to and including death; or upon the discretion of your surgeon and anesthesiologist your surgery may need to be rescheduled. ? ?Do not eat or drink after midnight the night before surgery.  ?No gum chewing, lozengers or hard candies. ? ?TAKE THESE MEDICATIONS THE MORNING OF SURGERY WITH A SIP OF WATER: ?RABEprazole (ACIPHEX) 20 MG tablet ? ? ?famotidine (PEPCID) 20 MG tablet (take one the night before and one on the morning of surgery - helps to prevent nausea after surgery.) ? ?One week prior to surgery: ?Stop Anti-inflammatories (NSAIDS) such as Advil, Aleve, Ibuprofen, Motrin, Naproxen, Naprosyn and Aspirin based products such as Excedrin, Goodys Powder, BC Powder. ?Stop taking your Calcium Carb-Cholecalciferol (CALCIUM 600+D) 600-800 MG-UNIT TABS,  Multiple Vitamin (MULTI-VITAMINS) TABS, Omega-3 Fatty Acids (FISH OIL) 1200 MG CAPS, Turmeric Curcumin 500 MG CAPS, Melatonin 5 MG CAPS and ANY other OVER THE COUNTER supplements until after surgery. ?You may however, continue to take Tylenol if needed for pain up until the day of surgery. ? ?No Alcohol for 24 hours before or after surgery. ? ?No Smoking including e-cigarettes for 24 hours prior to surgery.  ?No chewable tobacco products for at least 6 hours prior to surgery.  ?No nicotine patches on the day of surgery. ? ?Do not use any "recreational" drugs for at least a week prior to your surgery.  ?Please be advised that the combination of cocaine and anesthesia may have negative outcomes, up to and including death. ?If you test positive for cocaine, your surgery will be cancelled. ? ?On the morning of surgery brush your teeth with toothpaste and water,  you may rinse your mouth with mouthwash if you wish. ?Do not swallow any toothpaste or mouthwash. ? ?Use CHG wipes as directed on instruction sheet. ? ?Do not wear jewelry, make-up, hairpins, clips or nail polish. ? ?Do not wear lotions, powders, or perfumes.  ? ?Do not shave body from the neck down 48 hours prior to surgery just in case you cut yourself which could leave a site for infection.  ?Also, freshly shaved skin may become irritated if using the CHG soap. ? ?Do not bring valuables to the hospital. [facility] is not responsible for any missing/lost belongings or valuables.  ? ?Notify your doctor if there is any change in your medical condition (cold, fever, infection). ? ?Wear comfortable clothing (specific to your surgery type) to the hospital. ? ?If you are being discharged the day of surgery, you will not be allowed to drive home. ?You will need a responsible adult (18 years or older) to drive you home and stay with you that night.  ? ?If you are taking public transportation, you will need to have a responsible adult (18 years or older) with you. ?Please confirm with your physician that it is acceptable to use public transportation.  ? ?Please call the [facility] at [phone] if you have any questions about these instructions. ? ?Surgery Visitation Policy: ? ?Patients undergoing a surgery or procedure may have one family member or support person with them as long as that person is not COVID-19 positive or experiencing its symptoms.  ?That person may remain in the waiting area during the procedure and  may rotate out with other people. ? ?Inpatient Visitation:   ? ?Visiting hours are 7 a.m. to 8 p.m. ?Up to two visitors ages 16+ are allowed at one time in a patient room. The visitors may rotate out with other people during the day. Visitors must check out when they leave, or other visitors will not be allowed. One designated support person may remain overnight. ?The visitor must pass  COVID-19 screenings, use hand sanitizer when entering and exiting the patient?s room and wear a mask at all times, including in the patient?s room. ?Patients must also wear a mask when staff or their visitor are in the room. ?Masking is required regardless of vaccination status.  ?

## 2021-07-26 DIAGNOSIS — Z20822 Contact with and (suspected) exposure to covid-19: Secondary | ICD-10-CM | POA: Diagnosis not present

## 2021-07-27 ENCOUNTER — Other Ambulatory Visit: Payer: Self-pay

## 2021-07-27 ENCOUNTER — Encounter
Admission: RE | Admit: 2021-07-27 | Discharge: 2021-07-27 | Disposition: A | Discharge status: Home / Self Care | Payer: Medicare Other | Source: Ambulatory Visit | Attending: Surgery | Admitting: Surgery

## 2021-07-27 DIAGNOSIS — Z01810 Encounter for preprocedural cardiovascular examination: Secondary | ICD-10-CM | POA: Diagnosis not present

## 2021-07-27 DIAGNOSIS — Z01818 Encounter for other preprocedural examination: Secondary | ICD-10-CM | POA: Diagnosis not present

## 2021-07-27 DIAGNOSIS — D4989 Neoplasm of unspecified behavior of other specified sites: Secondary | ICD-10-CM | POA: Diagnosis not present

## 2021-07-27 LAB — CBC WITH DIFFERENTIAL/PLATELET
Abs Immature Granulocytes: 0.03 10*3/uL (ref 0.00–0.07)
Basophils Absolute: 0 10*3/uL (ref 0.0–0.1)
Basophils Relative: 0 %
Eosinophils Absolute: 0.2 10*3/uL (ref 0.0–0.5)
Eosinophils Relative: 3 %
HCT: 37.3 % (ref 36.0–46.0)
Hemoglobin: 12.5 g/dL (ref 12.0–15.0)
Immature Granulocytes: 1 %
Lymphocytes Relative: 15 %
Lymphs Abs: 1 10*3/uL (ref 0.7–4.0)
MCH: 33 pg (ref 26.0–34.0)
MCHC: 33.5 g/dL (ref 30.0–36.0)
MCV: 98.4 fL (ref 80.0–100.0)
Monocytes Absolute: 0.4 10*3/uL (ref 0.1–1.0)
Monocytes Relative: 6 %
Neutro Abs: 4.8 10*3/uL (ref 1.7–7.7)
Neutrophils Relative %: 75 %
Platelets: 265 10*3/uL (ref 150–400)
RBC: 3.79 MIL/uL — ABNORMAL LOW (ref 3.87–5.11)
RDW: 13.5 % (ref 11.5–15.5)
WBC: 6.4 10*3/uL (ref 4.0–10.5)
nRBC: 0 % (ref 0.0–0.2)

## 2021-07-27 LAB — COMPREHENSIVE METABOLIC PANEL
ALT: 36 U/L (ref 0–44)
AST: 23 U/L (ref 15–41)
Albumin: 4 g/dL (ref 3.5–5.0)
Alkaline Phosphatase: 74 U/L (ref 38–126)
Anion gap: 7 (ref 5–15)
BUN: 16 mg/dL (ref 8–23)
CO2: 26 mmol/L (ref 22–32)
Calcium: 8.9 mg/dL (ref 8.9–10.3)
Chloride: 105 mmol/L (ref 98–111)
Creatinine, Ser: 0.72 mg/dL (ref 0.44–1.00)
GFR, Estimated: >60 mL/min (ref >60)
Glucose, Bld: 119 mg/dL — ABNORMAL HIGH (ref 70–99)
Potassium: 4 mmol/L (ref 3.5–5.1)
Sodium: 138 mmol/L (ref 135–145)
Total Bilirubin: 0.5 mg/dL (ref 0.3–1.2)
Total Protein: 6.7 g/dL (ref 6.5–8.1)

## 2021-07-30 ENCOUNTER — Encounter
Admission: RE | Disposition: A | Discharge status: Home / Self Care | Payer: Self-pay | Source: Home / Self Care | Attending: Surgery

## 2021-07-30 ENCOUNTER — Ambulatory Visit: Payer: Medicare Other | Admitting: Certified Registered Nurse Anesthetist

## 2021-07-30 ENCOUNTER — Other Ambulatory Visit: Payer: Self-pay

## 2021-07-30 ENCOUNTER — Ambulatory Visit
Admission: RE | Admit: 2021-07-30 | Discharge: 2021-07-30 | Disposition: A | Discharge status: Home / Self Care | Payer: Medicare Other | Attending: Surgery | Admitting: Surgery

## 2021-07-30 ENCOUNTER — Encounter: Payer: Self-pay | Admitting: Surgery

## 2021-07-30 ENCOUNTER — Ambulatory Visit: Payer: Medicare Other | Admitting: Urgent Care

## 2021-07-30 DIAGNOSIS — K219 Gastro-esophageal reflux disease without esophagitis: Secondary | ICD-10-CM | POA: Diagnosis not present

## 2021-07-30 DIAGNOSIS — G43909 Migraine, unspecified, not intractable, without status migrainosus: Secondary | ICD-10-CM | POA: Insufficient documentation

## 2021-07-30 DIAGNOSIS — Z8616 Personal history of COVID-19: Secondary | ICD-10-CM | POA: Insufficient documentation

## 2021-07-30 DIAGNOSIS — M1990 Unspecified osteoarthritis, unspecified site: Secondary | ICD-10-CM | POA: Insufficient documentation

## 2021-07-30 DIAGNOSIS — R222 Localized swelling, mass and lump, trunk: Secondary | ICD-10-CM | POA: Diagnosis not present

## 2021-07-30 DIAGNOSIS — D171 Benign lipomatous neoplasm of skin and subcutaneous tissue of trunk: Secondary | ICD-10-CM | POA: Insufficient documentation

## 2021-07-30 DIAGNOSIS — D4989 Neoplasm of unspecified behavior of other specified sites: Secondary | ICD-10-CM

## 2021-07-30 DIAGNOSIS — I10 Essential (primary) hypertension: Secondary | ICD-10-CM | POA: Insufficient documentation

## 2021-07-30 DIAGNOSIS — Z85828 Personal history of other malignant neoplasm of skin: Secondary | ICD-10-CM | POA: Diagnosis not present

## 2021-07-30 DIAGNOSIS — Z87891 Personal history of nicotine dependence: Secondary | ICD-10-CM | POA: Diagnosis not present

## 2021-07-30 DIAGNOSIS — K589 Irritable bowel syndrome without diarrhea: Secondary | ICD-10-CM | POA: Insufficient documentation

## 2021-07-30 HISTORY — PX: MASS EXCISION: SHX2000

## 2021-07-30 SURGERY — EXCISION MASS
Anesthesia: General | Laterality: Left

## 2021-07-30 MED ORDER — DEXMEDETOMIDINE (PRECEDEX) IN NS 20 MCG/5ML (4 MCG/ML) IV SYRINGE
PREFILLED_SYRINGE | INTRAVENOUS | Status: DC | PRN
  Administered 2021-07-30: 8 ug via INTRAVENOUS

## 2021-07-30 MED ORDER — CHLORHEXIDINE GLUCONATE CLOTH 2 % EX PADS
6.0000 | MEDICATED_PAD | Freq: Once | CUTANEOUS | Status: AC
  Administered 2021-07-30: 6 via TOPICAL

## 2021-07-30 MED ORDER — PROPOFOL 500 MG/50ML IV EMUL
INTRAVENOUS | Status: DC | PRN
  Administered 2021-07-30: 125 ug/kg/min via INTRAVENOUS

## 2021-07-30 MED ORDER — LACTATED RINGERS IV SOLN: INTRAVENOUS | Status: DC

## 2021-07-30 MED ORDER — BUPIVACAINE-EPINEPHRINE (PF) 0.25% -1:200000 IJ SOLN
INTRAMUSCULAR | Status: DC | PRN
  Administered 2021-07-30: 10 mL
  Administered 2021-07-30: 20 mL

## 2021-07-30 MED ORDER — CHLORHEXIDINE GLUCONATE 0.12 % MT SOLN: 15.0000 mL | Freq: Once | OROMUCOSAL | Status: AC

## 2021-07-30 MED ORDER — OXYCODONE HCL 5 MG PO TABS: 5.0000 mg | ORAL_TABLET | Freq: Once | ORAL | Status: DC | PRN

## 2021-07-30 MED ORDER — LACTATED RINGERS IV SOLN: INTRAVENOUS | Status: DC | PRN

## 2021-07-30 MED ORDER — DEXAMETHASONE SODIUM PHOSPHATE 10 MG/ML IJ SOLN
INTRAMUSCULAR | Status: DC | PRN
  Administered 2021-07-30: 10 mg via INTRAVENOUS

## 2021-07-30 MED ORDER — CELECOXIB 200 MG PO CAPS
ORAL_CAPSULE | ORAL | Status: AC
  Administered 2021-07-30: 200 mg via ORAL
  Filled 2021-07-30: qty 1

## 2021-07-30 MED ORDER — GABAPENTIN 300 MG PO CAPS
ORAL_CAPSULE | ORAL | Status: AC
  Administered 2021-07-30: 300 mg via ORAL
  Filled 2021-07-30: qty 1

## 2021-07-30 MED ORDER — SUGAMMADEX SODIUM 200 MG/2ML IV SOLN
INTRAVENOUS | Status: DC | PRN
  Administered 2021-07-30: 200 mg via INTRAVENOUS

## 2021-07-30 MED ORDER — PROPOFOL 500 MG/50ML IV EMUL
INTRAVENOUS | Status: AC
  Filled 2021-07-30: qty 50

## 2021-07-30 MED ORDER — GABAPENTIN 300 MG PO CAPS: 300.0000 mg | ORAL_CAPSULE | ORAL | Status: AC

## 2021-07-30 MED ORDER — LIDOCAINE HCL (CARDIAC) PF 100 MG/5ML IV SOSY
PREFILLED_SYRINGE | INTRAVENOUS | Status: DC | PRN
  Administered 2021-07-30: 20 mg via INTRAVENOUS

## 2021-07-30 MED ORDER — BUPIVACAINE-EPINEPHRINE (PF) 0.25% -1:200000 IJ SOLN
INTRAMUSCULAR | Status: AC
  Filled 2021-07-30: qty 30

## 2021-07-30 MED ORDER — ONDANSETRON HCL 4 MG/2ML IJ SOLN
INTRAMUSCULAR | Status: DC | PRN
  Administered 2021-07-30: 4 mg via INTRAVENOUS

## 2021-07-30 MED ORDER — FENTANYL CITRATE (PF) 100 MCG/2ML IJ SOLN
INTRAMUSCULAR | Status: AC
  Filled 2021-07-30: qty 2

## 2021-07-30 MED ORDER — PROPOFOL 10 MG/ML IV BOLUS
INTRAVENOUS | Status: AC
  Filled 2021-07-30: qty 40

## 2021-07-30 MED ORDER — ACETAMINOPHEN 500 MG PO TABS: 1000.0000 mg | ORAL_TABLET | ORAL | Status: AC

## 2021-07-30 MED ORDER — 0.9 % SODIUM CHLORIDE (POUR BTL) OPTIME
TOPICAL | Status: DC | PRN
  Administered 2021-07-30: 500 mL

## 2021-07-30 MED ORDER — IBUPROFEN 800 MG PO TABS: 800.0000 mg | ORAL_TABLET | Freq: Three times a day (TID) | ORAL | 0 refills | Status: DC | PRN

## 2021-07-30 MED ORDER — HYDROMORPHONE HCL 1 MG/ML IJ SOLN
INTRAMUSCULAR | Status: AC
  Filled 2021-07-30: qty 1

## 2021-07-30 MED ORDER — ONDANSETRON HCL 4 MG PO TABS: 4.0000 mg | ORAL_TABLET | Freq: Every day | ORAL | 1 refills | Status: DC | PRN

## 2021-07-30 MED ORDER — APREPITANT 40 MG PO CAPS: 40.0000 mg | ORAL_CAPSULE | Freq: Once | ORAL | Status: AC

## 2021-07-30 MED ORDER — HYDROMORPHONE HCL 1 MG/ML IJ SOLN
INTRAMUSCULAR | Status: DC | PRN
  Administered 2021-07-30: .5 mg via INTRAVENOUS

## 2021-07-30 MED ORDER — CELECOXIB 200 MG PO CAPS: 200.0000 mg | ORAL_CAPSULE | ORAL | Status: AC

## 2021-07-30 MED ORDER — BUPIVACAINE LIPOSOME 1.3 % IJ SUSP: 20.0000 mL | Freq: Once | INTRAMUSCULAR | Status: DC

## 2021-07-30 MED ORDER — OXYCODONE HCL 5 MG/5ML PO SOLN: 5.0000 mg | Freq: Once | ORAL | Status: DC | PRN

## 2021-07-30 MED ORDER — CHLORHEXIDINE GLUCONATE 0.12 % MT SOLN
OROMUCOSAL | Status: AC
  Administered 2021-07-30: 15 mL via OROMUCOSAL
  Filled 2021-07-30: qty 15

## 2021-07-30 MED ORDER — APREPITANT 40 MG PO CAPS
ORAL_CAPSULE | ORAL | Status: AC
  Administered 2021-07-30: 40 mg via ORAL
  Filled 2021-07-30: qty 1

## 2021-07-30 MED ORDER — ACETAMINOPHEN 500 MG PO TABS
ORAL_TABLET | ORAL | Status: AC
  Administered 2021-07-30: 1000 mg via ORAL
  Filled 2021-07-30: qty 2

## 2021-07-30 MED ORDER — HYDROCODONE-ACETAMINOPHEN 5-325 MG PO TABS: 1.0000 | ORAL_TABLET | Freq: Four times a day (QID) | ORAL | 0 refills | Status: DC | PRN

## 2021-07-30 MED ORDER — FENTANYL CITRATE (PF) 100 MCG/2ML IJ SOLN
INTRAMUSCULAR | Status: DC | PRN
  Administered 2021-07-30 (×2): 50 ug via INTRAVENOUS

## 2021-07-30 MED ORDER — ORAL CARE MOUTH RINSE: 15.0000 mL | Freq: Once | OROMUCOSAL | Status: AC

## 2021-07-30 MED ORDER — ROCURONIUM BROMIDE 100 MG/10ML IV SOLN
INTRAVENOUS | Status: DC | PRN
  Administered 2021-07-30: 50 mg via INTRAVENOUS

## 2021-07-30 MED ORDER — PROPOFOL 10 MG/ML IV BOLUS
INTRAVENOUS | Status: DC | PRN
  Administered 2021-07-30: 150 mg via INTRAVENOUS

## 2021-07-30 MED ORDER — FENTANYL CITRATE (PF) 100 MCG/2ML IJ SOLN: 25.0000 ug | INTRAMUSCULAR | Status: DC | PRN

## 2021-07-30 SURGICAL SUPPLY — 30 items
ADH SKN CLS APL DERMABOND .7 (GAUZE/BANDAGES/DRESSINGS) ×1
APL PRP STRL LF DISP 70% ISPRP (MISCELLANEOUS) ×1
BLADE SURG 15 STRL LF DISP TIS (BLADE) ×1 IMPLANT
BLADE SURG 15 STRL SS (BLADE) ×2
CHLORAPREP W/TINT 26 (MISCELLANEOUS) ×2 IMPLANT
DERMABOND ADVANCED (GAUZE/BANDAGES/DRESSINGS) ×1
DERMABOND ADVANCED .7 DNX12 (GAUZE/BANDAGES/DRESSINGS) ×1 IMPLANT
DRAPE LAPAROTOMY 77X122 PED (DRAPES) ×2 IMPLANT
ELECT CAUTERY BLADE TIP 2.5 (TIP) ×2
ELECT REM PT RETURN 9FT ADLT (ELECTROSURGICAL) ×2
ELECTRODE CAUTERY BLDE TIP 2.5 (TIP) ×1 IMPLANT
ELECTRODE REM PT RTRN 9FT ADLT (ELECTROSURGICAL) ×1 IMPLANT
GAUZE 4X4 16PLY ~~LOC~~+RFID DBL (SPONGE) ×1 IMPLANT
GLOVE SURG ORTHO LTX SZ7.5 (GLOVE) ×8 IMPLANT
GOWN STRL REUS W/ TWL LRG LVL3 (GOWN DISPOSABLE) ×1 IMPLANT
GOWN STRL REUS W/ TWL XL LVL3 (GOWN DISPOSABLE) ×1 IMPLANT
GOWN STRL REUS W/TWL LRG LVL3 (GOWN DISPOSABLE) ×6
GOWN STRL REUS W/TWL XL LVL3 (GOWN DISPOSABLE) ×2
KIT TURNOVER KIT A (KITS) ×2 IMPLANT
MANIFOLD NEPTUNE II (INSTRUMENTS) ×2 IMPLANT
NEEDLE HYPO 22GX1.5 SAFETY (NEEDLE) ×2 IMPLANT
PACK BASIN MINOR ARMC (MISCELLANEOUS) ×2 IMPLANT
SUT MNCRL 4-0 (SUTURE) ×2
SUT MNCRL 4-0 27XMFL (SUTURE) ×1
SUT VIC AB 3-0 SH 27 (SUTURE) ×4
SUT VIC AB 3-0 SH 27X BRD (SUTURE) ×1 IMPLANT
SUTURE MNCRL 4-0 27XMF (SUTURE) ×1 IMPLANT
SYR 10ML LL (SYRINGE) ×2 IMPLANT
SYR BULB IRRIG 60ML STRL (SYRINGE) ×2 IMPLANT
WATER STERILE IRR 500ML POUR (IV SOLUTION) ×1 IMPLANT

## 2021-07-30 NOTE — Op Note (Signed)
Excision of soft tissue, recurrent lipomatous mass left lumbar thoraco area, greater than 12 cm. ? 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 ? ?Post-operative Diagnosis: 12+ centimeters in greatest diameter. ? ?Surgeon: Ronny Bacon, M.D., FACS ? ?Anesthesia: General ? ?Findings: Multilobular lipomatous tissue involving the immediate subdermal space, and the deep muscular fascia. ? ?Estimated Blood Loss: 10 mL ?        ?Specimens: Multiple lobules of large lipomatous tissue.    ?      ?Complications: none ?        ?     ?Procedure Details  ?The patient was seen again in the Holding Room. The benefits, complications, treatment options, and expected outcomes were discussed with the patient. The risks of bleeding, infection, recurrence of symptoms, failure to resolve symptoms, unanticipated injury, prosthetic placement, prosthetic infection, any of which could require further surgery were reviewed with the patient. The likelihood of improving the patient's symptoms with return to their baseline status is expected.  The patient and/or family concurred with the proposed plan, giving informed consent.  The patient was taken to Operating Room, identified and the procedure verified.   ? ?Prior to the induction of general anesthesia, antibiotic prophylaxis was administered. VTE prophylaxis was in place.  General anesthesia was then administered and tolerated well. After the induction, the patient was positioned in the prone position and the left lateral back was prepped with  Chloraprep and draped in the sterile fashion.  ?A Time Out was held and the above information confirmed. ? ?Local infiltration with quarter percent Marcaine with epinephrine is infiltrated in the previous scar.  Incision was made extended down through the dermis into the lipomatous tissue immediately under.  Utilizing skin hooks for retraction, began making flaps in all directions, following the  lipomatous lesions down to the deep muscular fascia.  Then excised the lipomatous masses off of the underlying deep muscular fascia.  We then confirmed adequate hemostasis, the absence of any additional lipomas.  Irrigated the wound cavity with multiple aliquots of saline solution ensuring all devascularized fatty tissue was removed.  Reconfirmed hemostasis, and then began closure.  I utilized the remaining local anesthetic in the deep tissues to provide an adequate field block to the region.  I incorporated my deep dermal sutures of 3-0 Vicryl with the underlying muscular fascia hopefully obliterating any space for seroma development.  I then ran a subcuticular of 4-0 Monocryl, and sealed the skin with Dermabond. ? ? ? ? ? ?Ronny Bacon M.D., FACS ?[facility] ?07/30/2021 8:55 AM  ? ?

## 2021-07-30 NOTE — Interval H&P Note (Signed)
History and Physical Interval Note: ? ?07/30/2021 ?7:23 AM ? ?Victoria Moss  has presented today for surgery, with the diagnosis of soft tissue mass lumbar back 10 cm.  The various methods of treatment have been discussed with the patient and family. After consideration of risks, benefits and other options for treatment, the patient has consented to  Procedure(s): ?EXCISION MASS, soft tissue left lumbar back (Left) as a surgical intervention.  The patient's history has been reviewed, patient examined, no change in status, stable for surgery.  I have reviewed the patient's chart and labs.  Questions were answered to the patient's satisfaction.   ? ? ?Ronny Bacon ? ? ?

## 2021-07-30 NOTE — Anesthesia Postprocedure Evaluation (Signed)
Anesthesia Post Note ? ?Patient: Malayna Noori ? ?Procedure(s) Performed: EXCISION MASS, soft tissue left lumbar back (Left) ? ?Patient location during evaluation: PACU ?Anesthesia Type: General ?Level of consciousness: awake and alert ?Pain management: pain level controlled ?Vital Signs Assessment: post-procedure vital signs reviewed and stable ?Respiratory status: spontaneous breathing, nonlabored ventilation, respiratory function stable and patient connected to nasal cannula oxygen ?Cardiovascular status: blood pressure returned to baseline and stable ?Postop Assessment: no apparent nausea or vomiting ?Anesthetic complications: no ? ? ?No notable events documented. ? ? ?Last Vitals:  ?Vitals:  ? 07/30/21 0915 07/30/21 0925  ?BP: (!) 141/73 (!) 154/83  ?Pulse: 68 69  ?Resp: 14 18  ?Temp: 36.6 ?C (!) 36.1 ?C  ?SpO2: 97% 96%  ?  ?Last Pain:  ?Vitals:  ? 07/30/21 0925  ?TempSrc: Temporal  ?PainSc: 0-No pain  ? ? ?  ?  ?  ?  ?  ?  ? ?Precious Haws Kourtnei Rauber ? ? ? ? ?

## 2021-07-30 NOTE — Discharge Instructions (Signed)

## 2021-07-30 NOTE — Anesthesia Preprocedure Evaluation (Signed)
Anesthesia Evaluation  ?Patient identified by MRN, date of birth, ID band ?Patient awake ? ? ? ?Reviewed: ?Allergy & Precautions, NPO status , Patient's Chart, lab work & pertinent test results ? ?History of Anesthesia Complications ?(+) PONV and history of anesthetic complications ? ?Airway ?Mallampati: III ? ?TM Distance: <3 FB ?Neck ROM: full ? ? ? Dental ? ?(+) Chipped ?  ?Pulmonary ?neg shortness of breath, former smoker,  ?  ?Pulmonary exam normal ? ? ? ? ? ? ? Cardiovascular ?Exercise Tolerance: Good ?hypertension, (-) angina+ Peripheral Vascular Disease  ?(-) Past MI Normal cardiovascular exam ? ? ?  ?Neuro/Psych ? Headaches, PSYCHIATRIC DISORDERS   ? GI/Hepatic ?Neg liver ROS, PUD, GERD  Controlled,  ?Endo/Other  ?negative endocrine ROS ? Renal/GU ?  ? ?  ?Musculoskeletal ? ?(+) Arthritis ,  ? Abdominal ?  ?Peds ? Hematology ?negative hematology ROS ?(+)   ?Anesthesia Other Findings ?Past Medical History: ?No date: Allergy ?No date: Arthritis ?    Comment:  all over and right wrist ?No date: Basal cell cancer ?10/2020: COVID-19 ?No date: Diverticulosis ?    Comment:  H/O ?No date: GERD (gastroesophageal reflux disease) ?No date: Heart murmur ?No date: Hypertension ?No date: IBS (irritable bowel syndrome) ?No date: Migraine ?    Comment:  H/O, none since stopping chocolate ?No date: Osteoporosis ?No date: PONV (postoperative nausea and vomiting) ?No date: Rheumatic fever ?    Comment:  H/O ?08/2020: Shingles ?No date: Status post dilation of esophageal narrowing ? ?Past Surgical History: ?1982: APPENDECTOMY ?09/2014: BREAST BIOPSY; Right ?    Comment:  axillary node- neg ?No date: CATARACT EXTRACTION, BILATERAL ?No date: CESAREAN SECTION ?2018: COLONOSCOPY ?No date: DILATION AND CURETTAGE OF UTERUS ?    Comment:  x2 ?No date: DILATION AND CURETTAGE, DIAGNOSTIC / THERAPEUTIC ?    Comment:  x 2 ?No date: fatty tumor ?    Comment:  on back x2 ?No date: HERNIA REPAIR ?No date:  KNEE SURGERY; Right ?03/24/2021: MYRINGOTOMY WITH TUBE PLACEMENT; Bilateral ?    Comment:  Procedure: MYRINGOTOMY WITH TUBE PLACEMENT;  Surgeon:  ?             Carloyn Manner, MD;  Location: [facility];   ?             Service: ENT;  Laterality: Bilateral; ?No date: parathyroid gland one removed ?No date: PARATHYROIDECTOMY ?No date: SKIN CANCER EXCISION ?    Comment:  BCCA  ?No date: TOE SURGERY; Left ?    Comment:  joint replacement bit toe ?No date: TUBAL LIGATION ?No date: UMBILICAL HERNIA REPAIR ?No date: UPPER GI ENDOSCOPY ? ? ? ? Reproductive/Obstetrics ?negative OB ROS ? ?  ? ? ? ? ? ? ? ? ? ? ? ? ? ?  ?  ? ? ? ? ? ? ? ? ?Anesthesia Physical ?Anesthesia Plan ? ?ASA: 3 ? ?Anesthesia Plan: General ETT  ? ?Post-op Pain Management:   ? ?Induction: Intravenous ? ?PONV Risk Score and Plan: Ondansetron, Dexamethasone, Midazolam and Treatment may vary due to age or medical condition ? ?Airway Management Planned: Oral ETT ? ?Additional Equipment:  ? ?Intra-op Plan:  ? ?Post-operative Plan: Extubation in OR ? ?Informed Consent: I have reviewed the patients History and Physical, chart, labs and discussed the procedure including the risks, benefits and alternatives for the proposed anesthesia with the patient or authorized representative who has indicated his/her understanding and acceptance.  ? ? ? ?Dental Advisory Given ? ?Plan Discussed  with: Anesthesiologist, CRNA and Surgeon ? ?Anesthesia Plan Comments: (Patient consented for risks of anesthesia including but not limited to:  ?- adverse reactions to medications ?- damage to eyes, teeth, lips or other oral mucosa ?- nerve damage due to positioning  ?- sore throat or hoarseness ?- Damage to heart, brain, nerves, lungs, other parts of body or loss of life ? ?Patient voiced understanding.)  ? ? ? ? ? ? ?Anesthesia Quick Evaluation ? ?

## 2021-07-30 NOTE — Transfer of Care (Signed)
Immediate Anesthesia Transfer of Care Note ? ?Patient: Victoria Moss ? ?Procedure(s) Performed: EXCISION MASS, soft tissue left lumbar back (Left) ? ?Patient Location: PACU ? ?Anesthesia Type:General ? ?Level of Consciousness: drowsy and patient cooperative ? ?Airway & Oxygen Therapy: Patient Spontanous Breathing and Patient connected to face mask oxygen ? ?Post-op Assessment: Report given to RN and Post -op Vital signs reviewed and stable ? ?Post vital signs: Reviewed and stable ? ?Last Vitals:  ?Vitals Value Taken Time  ?BP 135/69 07/30/21 0849  ?Temp 36.2 ?C 07/30/21 0849  ?Pulse 71 07/30/21 0852  ?Resp 10 07/30/21 0852  ?SpO2 94 % 07/30/21 0852  ?Vitals shown include unvalidated device data. ? ?Last Pain:  ?Vitals:  ? 07/30/21 0654  ?TempSrc: Temporal  ?PainSc: 0-No pain  ?   ? ?  ? ?Complications: No notable events documented. ?

## 2021-07-30 NOTE — Anesthesia Procedure Notes (Addendum)
Procedure Name: Intubation ?Date/Time: 07/30/2021 7:41 AM ?Performed by: Louann Sjogren, CRNA ?Pre-anesthesia Checklist: Patient identified, Patient being monitored, Timeout performed, Emergency Drugs available and Suction available ?Patient Re-evaluated:Patient Re-evaluated prior to induction ?Oxygen Delivery Method: Circle system utilized ?Preoxygenation: Pre-oxygenation with 100% oxygen ?Induction Type: IV induction ?Ventilation: Mask ventilation without difficulty ?Laryngoscope Size: 3, Glidescope and 4 ?Grade View: Grade I ?Tube type: Oral ?Tube size: 7.0 mm ?Number of attempts: 1 ?Airway Equipment and Method: Stylet ?Placement Confirmation: ETT inserted through vocal cords under direct vision, positive ETCO2 and breath sounds checked- equal and bilateral ?Secured at: 21 cm ?Tube secured with: Tape ?Dental Injury: Teeth and Oropharynx as per pre-operative assessment  ? ? ? ? ?

## 2021-07-31 ENCOUNTER — Encounter: Payer: Self-pay | Admitting: Surgery

## 2021-08-03 LAB — SURGICAL PATHOLOGY

## 2021-08-11 DIAGNOSIS — M0600 Rheumatoid arthritis without rheumatoid factor, unspecified site: Secondary | ICD-10-CM | POA: Diagnosis not present

## 2021-08-11 DIAGNOSIS — Z79899 Other long term (current) drug therapy: Secondary | ICD-10-CM | POA: Diagnosis not present

## 2021-08-11 DIAGNOSIS — M159 Polyosteoarthritis, unspecified: Secondary | ICD-10-CM | POA: Diagnosis not present

## 2021-08-12 ENCOUNTER — Ambulatory Visit (INDEPENDENT_AMBULATORY_CARE_PROVIDER_SITE_OTHER): Payer: Medicare Other | Admitting: Physician Assistant

## 2021-08-12 ENCOUNTER — Ambulatory Visit (INDEPENDENT_AMBULATORY_CARE_PROVIDER_SITE_OTHER): Payer: Medicare Other | Admitting: Internal Medicine

## 2021-08-12 ENCOUNTER — Encounter: Payer: Self-pay | Admitting: Physician Assistant

## 2021-08-12 VITALS — BP 152/81 | HR 61 | Temp 98.2°F | Ht 65.75 in | Wt 144.4 lb

## 2021-08-12 DIAGNOSIS — D171 Benign lipomatous neoplasm of skin and subcutaneous tissue of trunk: Secondary | ICD-10-CM

## 2021-08-12 DIAGNOSIS — G479 Sleep disorder, unspecified: Secondary | ICD-10-CM | POA: Diagnosis not present

## 2021-08-12 DIAGNOSIS — Z09 Encounter for follow-up examination after completed treatment for conditions other than malignant neoplasm: Secondary | ICD-10-CM

## 2021-08-12 DIAGNOSIS — I779 Disorder of arteries and arterioles, unspecified: Secondary | ICD-10-CM | POA: Diagnosis not present

## 2021-08-12 DIAGNOSIS — I701 Atherosclerosis of renal artery: Secondary | ICD-10-CM | POA: Diagnosis not present

## 2021-08-12 DIAGNOSIS — E782 Mixed hyperlipidemia: Secondary | ICD-10-CM | POA: Diagnosis not present

## 2021-08-12 DIAGNOSIS — I10 Essential (primary) hypertension: Secondary | ICD-10-CM

## 2021-08-12 DIAGNOSIS — R739 Hyperglycemia, unspecified: Secondary | ICD-10-CM

## 2021-08-12 DIAGNOSIS — I773 Arterial fibromuscular dysplasia: Secondary | ICD-10-CM | POA: Diagnosis not present

## 2021-08-12 DIAGNOSIS — E210 Primary hyperparathyroidism: Secondary | ICD-10-CM | POA: Diagnosis not present

## 2021-08-12 DIAGNOSIS — K219 Gastro-esophageal reflux disease without esophagitis: Secondary | ICD-10-CM | POA: Diagnosis not present

## 2021-08-12 DIAGNOSIS — K589 Irritable bowel syndrome without diarrhea: Secondary | ICD-10-CM

## 2021-08-12 DIAGNOSIS — M069 Rheumatoid arthritis, unspecified: Secondary | ICD-10-CM | POA: Diagnosis not present

## 2021-08-12 NOTE — Progress Notes (Signed)
Patient ID: Victoria Moss, female   DOB: 1950-05-15, 71 y.o.   MRN: [id number] ? ? ?Subjective:  ? ? Patient ID: Victoria Moss, female    DOB: [birth year], 71 y.o.   MRN: [id number] ? ?This visit occurred during the SARS-CoV-2 public health emergency.  Safety protocols were in place, including screening questions prior to the visit, additional usage of staff PPE, and extensive cleaning of exam room while observing appropriate contact time as indicated for disinfecting solutions.  ? ?Patient here for a scheduled follow up.  ? ?Chief Complaint  ?Patient presents with  ? Hypertension  ? Hyperlipidemia  ? Gastroesophageal Reflux  ? .  ? ?HPI ?Recently s/p lipoma excision.  Healing well.  Has f/u this pm.  Has been seeing ortho for her knee.  S/p recent ablation, aspiration and injection.  Doing some better.  No chest pain or sob reported.  No cough or congestion.  No abdominal pain now.  Had flare 08/06/21 - 08/10/21 - LLQ pain and diarrhea.  No pain now.  No diarrhea.  Feels back to her normal.  Has colonoscopy planned next week.  Still some sleep issues.  Takes occasional muscle relaxer.   ? ? ?Past Medical History:  ?Diagnosis Date  ? Allergy   ? Arthritis   ? all over and right wrist  ? Basal cell cancer   ? COVID-19 10/2020  ? Diverticulosis   ? H/O  ? GERD (gastroesophageal reflux disease)   ? Heart murmur   ? Hypertension   ? IBS (irritable bowel syndrome)   ? Migraine   ? H/O, none since stopping chocolate  ? Osteoporosis   ? PONV (postoperative nausea and vomiting)   ? Rheumatic fever   ? H/O  ? Shingles 08/2020  ? Status post dilation of esophageal narrowing   ? ?Past Surgical History:  ?Procedure Laterality Date  ? APPENDECTOMY  1982  ? BREAST BIOPSY Right 09/2014  ? axillary node- neg  ? CATARACT EXTRACTION, BILATERAL    ? CESAREAN SECTION    ? COLONOSCOPY  2018  ? DILATION AND CURETTAGE OF UTERUS    ? x2  ? DILATION AND CURETTAGE, DIAGNOSTIC / THERAPEUTIC    ? x 2  ? fatty tumor    ? on back x2  ?  HERNIA REPAIR    ? KNEE SURGERY Right   ? MASS EXCISION Left 07/30/2021  ? Procedure: EXCISION MASS, soft tissue left lumbar back;  Surgeon: Ronny Bacon, MD;  Location: [facility];  Service: General;  Laterality: Left;  ? MYRINGOTOMY WITH TUBE PLACEMENT Bilateral 03/24/2021  ? Procedure: MYRINGOTOMY WITH TUBE PLACEMENT;  Surgeon: Carloyn Manner, MD;  Location: [facility];  Service: ENT;  Laterality: Bilateral;  ? parathyroid gland one removed    ? PARATHYROIDECTOMY    ? SKIN CANCER EXCISION    ? BCCA   ? TOE SURGERY Left   ? joint replacement bit toe  ? TUBAL LIGATION    ? UMBILICAL HERNIA REPAIR    ? UPPER GI ENDOSCOPY    ? ?Family History  ?Problem Relation Age of Onset  ? Breast cancer Mother 83  ? Hypertension Mother   ? Stroke Mother   ?     Late 37  ? Arthritis Mother   ? Colon polyps Mother   ? Cancer Mother   ?     Breast  ? Hypertension Father   ? Hyperlipidemia Father   ? Dementia Father   ? COPD Father   ?  Congestive Heart Failure Father   ? Diabetes Neg Hx   ? Colon cancer Neg Hx   ? Esophageal cancer Neg Hx   ? Kidney disease Neg Hx   ? Gallbladder disease Neg Hx   ? ?Social History  ? ?Socioeconomic History  ? Marital status: Widowed  ?  Spouse name: Not on file  ? Number of children: 2  ? Years of education: Not on file  ? Highest education level: Not on file  ?Occupational History  ? Occupation: Retired  ?  Employer: OTHER  ?Tobacco Use  ? Smoking status: Former  ?  Years: 7.00  ?  Types: Cigarettes  ?  Quit date: 05/09/1973  ?  Years since quitting: 48.3  ? Smokeless tobacco: Never  ? Tobacco comments:  ?  Quit at age 89  ?Vaping Use  ? Vaping Use: Never used  ?Substance and Sexual Activity  ? Alcohol use: No  ?  Alcohol/week: 0.0 standard drinks  ? Drug use: No  ? Sexual activity: Not Currently  ?Other Topics Concern  ? Not on file  ?Social History Narrative  ? Not on file  ? ?Social Determinants of Health  ? ?Financial Resource Strain: Low Risk   ? Difficulty of Paying Living Expenses:  Not hard at all  ?Food Insecurity: No Food Insecurity  ? Worried About Charity fundraiser in the Last Year: Never true  ? Ran Out of Food in the Last Year: Never true  ?Transportation Needs: No Transportation Needs  ? Lack of Transportation (Medical): No  ? Lack of Transportation (Non-Medical): No  ?Physical Activity: Not on file  ?Stress: No Stress Concern Present  ? Feeling of Stress : Only a little  ?Social Connections: Unknown  ? Frequency of Communication with Friends and Family: Once a week  ? Frequency of Social Gatherings with Friends and Family: Not on file  ? Attends Religious Services: Not on file  ? Active Member of Clubs or Organizations: Not on file  ? Attends Archivist Meetings: Not on file  ? Marital Status: Not on file  ? ? ? ?Review of Systems  ?Constitutional:  Negative for fatigue and unexpected weight change.  ?HENT:  Negative for congestion and sinus pressure.   ?Respiratory:  Negative for cough, chest tightness and shortness of breath.   ?Cardiovascular:  Negative for chest pain, palpitations and leg swelling.  ?Gastrointestinal:  Negative for abdominal pain, diarrhea, nausea and vomiting.  ?Genitourinary:  Negative for difficulty urinating and dysuria.  ?Musculoskeletal:  Negative for myalgias.  ?     Knee pain as outlined.  ?Skin:  Negative for color change and rash.  ?Neurological:  Negative for dizziness, light-headedness and headaches.  ?Psychiatric/Behavioral:  Negative for agitation and dysphoric mood.   ? ?   ?Objective:  ?  ? ?BP 120/70   Pulse 89   Temp 97.9 ?F (36.6 ?C)   Resp 16   Ht '5\' 6"'$  (1.676 m)   Wt 143 lb (64.9 kg)   LMP 05/09/2006 (Approximate)   SpO2 98%   BMI 23.08 kg/m?  ?Wt Readings from Last 3 Encounters:  ?08/12/21 144 lb 6.4 oz (65.5 kg)  ?08/12/21 143 lb (64.9 kg)  ?07/22/21 148 lb (67.1 kg)  ? ? ?Physical Exam ?Vitals reviewed.  ?Constitutional:   ?   General: She is not in acute distress. ?   Appearance: Normal appearance.  ?HENT:  ?   Head:  Normocephalic and atraumatic.  ?   Right  Ear: External ear normal.  ?   Left Ear: External ear normal.  ?Eyes:  ?   General: No scleral icterus.    ?   Right eye: No discharge.     ?   Left eye: No discharge.  ?   Conjunctiva/sclera: Conjunctivae normal.  ?Neck:  ?   Thyroid: No thyromegaly.  ?Cardiovascular:  ?   Rate and Rhythm: Normal rate and regular rhythm.  ?Pulmonary:  ?   Effort: No respiratory distress.  ?   Breath sounds: Normal breath sounds. No wheezing.  ?Abdominal:  ?   General: Bowel sounds are normal.  ?   Palpations: Abdomen is soft.  ?   Tenderness: There is no abdominal tenderness.  ?Musculoskeletal:     ?   General: No swelling or tenderness.  ?   Cervical back: Neck supple. No tenderness.  ?Lymphadenopathy:  ?   Cervical: No cervical adenopathy.  ?Skin: ?   Findings: No erythema or rash.  ?Neurological:  ?   Mental Status: She is alert.  ?Psychiatric:     ?   Mood and Affect: Mood normal.     ?   Behavior: Behavior normal.  ? ? ? ?Outpatient Encounter Medications as of 08/12/2021  ?Medication Sig  ? acetaminophen (TYLENOL) 500 MG tablet Take 1,000 mg by mouth 2 (two) times daily as needed for moderate pain or headache.  ? amLODipine (NORVASC) 5 MG tablet TAKE ONE TABLET BY MOUTH EVERY DAY  ? Calcium Carb-Cholecalciferol (CALCIUM 600+D) 600-800 MG-UNIT TABS Take 1 tablet by mouth daily.  ? cyclobenzaprine (FLEXERIL) 5 MG tablet TAKE ONE TABLET AT BEDTIME AS NEEDED FORMUSCLE SPASM  ? dicyclomine (BENTYL) 10 MG capsule TAKE 1 CAPSULE BY MOUTH ONCE DAILY AS NEEDED AS DIRECTED BY PHYSICIAN (Patient taking differently: Take 10 mg by mouth daily as needed for spasms.)  ? famotidine (PEPCID) 20 MG tablet Take 20 mg by mouth every evening.  ? folic acid (FOLVITE) 1 MG tablet Take 1 mg by mouth daily.  ? losartan (COZAAR) 100 MG tablet TAKE 1 TABLET BY MOUTH EVERY DAY  ? Melatonin 5 MG CAPS Take 5 mg by mouth at bedtime.  ? methotrexate (RHEUMATREX) 2.5 MG tablet Take 15 mg by mouth every Friday.  ?  Multiple Vitamin (MULTI-VITAMINS) TABS Take 1 tablet by mouth daily.   ? Omega-3 Fatty Acids (FISH OIL) 1200 MG CAPS Take 1,200 mg by mouth daily.  ? RABEprazole (ACIPHEX) 20 MG tablet Take 20 mg by mouth daily.  ? r

## 2021-08-12 NOTE — Patient Instructions (Signed)
If you have any concerns or questions, please feel free to call our office.  ? ?Excision of Skin Lesions, Care After ?The following information offers guidance on how to care for yourself after your procedure. Your health care provider may also give you more specific instructions. If you have problems or questions, contact your health care provider. ?What can I expect after the procedure? ?After your procedure, it is common to have: ?Soreness or mild pain. ?Some redness and swelling. ?Follow these instructions at home: ?Excision site care ? ?Follow instructions from your health care provider about how to take care of your excision site. Make sure you: ?Wash your hands with soap and water for at least 20 seconds before and after you change your bandage (dressing). If soap and water are not available, use hand sanitizer. ?Change your dressing as told by your health care provider. ?Leave stitches (sutures), skin glue, or adhesive strips in place. These skin closures may need to stay in place for 2 weeks or longer. If adhesive strip edges start to loosen and curl up, you may trim the loose edges. Do not remove adhesive strips completely unless your health care provider tells you to do that. ?Check the excision area every day for signs of infection. Watch for: ?More redness, swelling, or pain. ?Fluid or blood. ?Warmth. ?Pus or a bad smell. ?Keep the site clean, dry, and protected for at least 48 hours. ?For bleeding, apply gentle but firm pressure to the area using a folded towel for 20 minutes. ?Do not take baths, swim, or use a hot tub until your health care provider approves. Ask your health care provider if you may take showers. You may only be allowed to take sponge baths. ?General instructions ?Take over-the-counter and prescription medicines only as told by your health care provider. ?Follow instructions from your health care provider about how to minimize scarring. Scarring should lessen over time. ?Avoid sun  exposure until the area has healed. Use sunscreen to protect the area from the sun after it has healed. ?Avoid high-impact exercise and activities until the sutures are removed or the area heals. ?Keep all follow-up visits. This is important. ?Contact a health care provider if: ?You have more redness, swelling, or pain around your excision site. ?You have fluid or blood coming from your excision site. ?Your excision site feels warm to the touch. ?You have pus or a bad smell coming from your excision site. ?You have a fever. ?You have pain that does not improve in 2-3 days after your procedure. ?Get help right away if: ?You have bleeding that does not stop with pressure or a dressing. ?Your wound opens up. ?Summary ?Take over-the-counter and prescription medicines only as told by your health care provider. ?Change your dressing as told by your health care provider. ?Contact a health care provider if you have redness, swelling, pain, or other signs of infection around your excision site. ?Keep all follow-up visits. This is important. ?This information is not intended to replace advice given to you by your health care provider. Make sure you discuss any questions you have with your health care provider. ?Document Revised: 11/24/2020 Document Reviewed: 11/24/2020 ?Elsevier Patient Education ? [place]. ? ?

## 2021-08-12 NOTE — Progress Notes (Signed)
[facility] ?POST-OP OFFICE VISIT ? ?08/12/2021 ? ?HPI: ?Victoria Moss is a 71 y.o. 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 ?She has done very well ?No complaints ?No issues with her incisions  ? ?Vital signs: ?LMP 05/09/2006 (Approximate)   ? ?Physical Exam: ?Constitutional: Well appearing female, NAD ?Skin: incision to the left thoraco-lumbar region just inferior to scapula is well healed, dermabond peeling off, no erythema or drainage ? ?Assessment/Plan: ?This is a 71 y.o. 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 ? - Pain control prn ? - Reviewed surgical pathology; Lipoma ? - She can follow up on as needed basis; She understands to call with questions/concerns ? ?-- ?Edison Simon, PA-C ?[facility] ?08/12/2021, 2:05 PM ?[phone] ?M-F: 7am - 4pm ? ?

## 2021-08-15 ENCOUNTER — Encounter: Payer: Self-pay | Admitting: Internal Medicine

## 2021-08-15 NOTE — Assessment & Plan Note (Signed)
Evaluated by [facility].  Stable.  Recommended f/u prn.  

## 2021-08-15 NOTE — Assessment & Plan Note (Signed)
Continues on aciphex.  Appears to be stable.  

## 2021-08-15 NOTE — Assessment & Plan Note (Signed)
Takes MR prn.  Follow.  ?

## 2021-08-15 NOTE — Assessment & Plan Note (Signed)
Continue crestor.  Low cholesterol diet and exercise.  Follow lipid panel.  

## 2021-08-15 NOTE — Assessment & Plan Note (Signed)
Low carb diet and exercise.  Follow met b and a1c.   

## 2021-08-15 NOTE — Assessment & Plan Note (Signed)
F/p excision 07/2021.  Has f/u with surgery this pm.  ?

## 2021-08-15 NOTE — Assessment & Plan Note (Signed)
Worked up by endocrinology.  Follow calcium level.  

## 2021-08-15 NOTE — Assessment & Plan Note (Signed)
Has been evaluated by [facility].  Follow renal function.  

## 2021-08-15 NOTE — Assessment & Plan Note (Signed)
Continue losartan and amlodipine.  Follow pressures.  Follow metabolic panel.  

## 2021-08-15 NOTE — Assessment & Plan Note (Signed)
Followed by rheumatology.  On MTX.  Appears to be stable.  

## 2021-08-15 NOTE — Assessment & Plan Note (Signed)
Saw GI 01/2021. Colonoscopy planned next week.   ?

## 2021-08-16 ENCOUNTER — Encounter: Payer: Self-pay | Admitting: *Deleted

## 2021-08-17 ENCOUNTER — Ambulatory Visit: Payer: Medicare Other | Admitting: Urgent Care

## 2021-08-17 ENCOUNTER — Other Ambulatory Visit: Payer: Self-pay

## 2021-08-17 ENCOUNTER — Encounter
Admission: RE | Disposition: A | Discharge status: Home / Self Care | Payer: Self-pay | Source: Home / Self Care | Attending: Gastroenterology

## 2021-08-17 ENCOUNTER — Encounter: Payer: Self-pay | Admitting: *Deleted

## 2021-08-17 ENCOUNTER — Ambulatory Visit
Admission: RE | Admit: 2021-08-17 | Discharge: 2021-08-17 | Disposition: A | Discharge status: Home / Self Care | Payer: Medicare Other | Attending: Gastroenterology | Admitting: Gastroenterology

## 2021-08-17 DIAGNOSIS — D122 Benign neoplasm of ascending colon: Secondary | ICD-10-CM | POA: Insufficient documentation

## 2021-08-17 DIAGNOSIS — I10 Essential (primary) hypertension: Secondary | ICD-10-CM | POA: Insufficient documentation

## 2021-08-17 DIAGNOSIS — Z8371 Family history of colonic polyps: Secondary | ICD-10-CM | POA: Insufficient documentation

## 2021-08-17 DIAGNOSIS — K219 Gastro-esophageal reflux disease without esophagitis: Secondary | ICD-10-CM | POA: Diagnosis not present

## 2021-08-17 DIAGNOSIS — Z1211 Encounter for screening for malignant neoplasm of colon: Secondary | ICD-10-CM | POA: Insufficient documentation

## 2021-08-17 DIAGNOSIS — Z87891 Personal history of nicotine dependence: Secondary | ICD-10-CM | POA: Diagnosis not present

## 2021-08-17 DIAGNOSIS — Z8616 Personal history of COVID-19: Secondary | ICD-10-CM | POA: Insufficient documentation

## 2021-08-17 DIAGNOSIS — I2510 Atherosclerotic heart disease of native coronary artery without angina pectoris: Secondary | ICD-10-CM | POA: Diagnosis not present

## 2021-08-17 DIAGNOSIS — I739 Peripheral vascular disease, unspecified: Secondary | ICD-10-CM | POA: Insufficient documentation

## 2021-08-17 DIAGNOSIS — K640 First degree hemorrhoids: Secondary | ICD-10-CM | POA: Diagnosis not present

## 2021-08-17 DIAGNOSIS — M0600 Rheumatoid arthritis without rheumatoid factor, unspecified site: Secondary | ICD-10-CM | POA: Insufficient documentation

## 2021-08-17 DIAGNOSIS — Z800 Family history of malignant neoplasm of digestive organs: Secondary | ICD-10-CM | POA: Diagnosis not present

## 2021-08-17 DIAGNOSIS — K582 Mixed irritable bowel syndrome: Secondary | ICD-10-CM | POA: Insufficient documentation

## 2021-08-17 DIAGNOSIS — K635 Polyp of colon: Secondary | ICD-10-CM | POA: Diagnosis not present

## 2021-08-17 HISTORY — DX: Atherosclerosis of renal artery: I70.1

## 2021-08-17 HISTORY — PX: COLONOSCOPY WITH PROPOFOL: SHX5780

## 2021-08-17 SURGERY — COLONOSCOPY WITH PROPOFOL
Anesthesia: General

## 2021-08-17 MED ORDER — LIDOCAINE HCL (PF) 2 % IJ SOLN
INTRAMUSCULAR | Status: AC
  Filled 2021-08-17: qty 5

## 2021-08-17 MED ORDER — PROPOFOL 500 MG/50ML IV EMUL
INTRAVENOUS | Status: AC
  Filled 2021-08-17: qty 50

## 2021-08-17 MED ORDER — PROPOFOL 500 MG/50ML IV EMUL
INTRAVENOUS | Status: DC | PRN
  Administered 2021-08-17: 120 ug/kg/min via INTRAVENOUS

## 2021-08-17 MED ORDER — DEXMEDETOMIDINE HCL IN NACL 200 MCG/50ML IV SOLN
INTRAVENOUS | Status: DC | PRN
  Administered 2021-08-17: 8 ug via INTRAVENOUS

## 2021-08-17 MED ORDER — SODIUM CHLORIDE 0.9 % IV SOLN: INTRAVENOUS | Status: DC

## 2021-08-17 MED ORDER — PROPOFOL 10 MG/ML IV BOLUS
INTRAVENOUS | Status: DC | PRN
Start: 2021-08-17 — End: 2021-08-17
  Administered 2021-08-17: 100 mg via INTRAVENOUS

## 2021-08-17 MED ORDER — LIDOCAINE HCL (CARDIAC) PF 100 MG/5ML IV SOSY
PREFILLED_SYRINGE | INTRAVENOUS | Status: DC | PRN
  Administered 2021-08-17: 50 mg via INTRAVENOUS

## 2021-08-17 NOTE — Anesthesia Preprocedure Evaluation (Addendum)
Anesthesia Evaluation  ?Patient identified by MRN, date of birth, ID band ?Patient awake ? ? ? ?Reviewed: ?Allergy & Precautions, NPO status , Patient's Chart, lab work & pertinent test results ? ?History of Anesthesia Complications ?(+) PONV and history of anesthetic complications ? ?Airway ?Mallampati: I ? ?TM Distance: <3 FB ?Neck ROM: full ? ? ? Dental ?no notable dental hx. ? ?  ?Pulmonary ?neg shortness of breath, former smoker,  ?  ?Pulmonary exam normal ? ? ? ? ? ? ? Cardiovascular ?Exercise Tolerance: Good ?hypertension, (-) angina+ Peripheral Vascular Disease (Carotid artery disease, Renal Artery Disease)  ?(-) Past MI Normal cardiovascular exam ? ? ?  ?Neuro/Psych ? Headaches, PSYCHIATRIC DISORDERS Anxiety   ? GI/Hepatic ?Neg liver ROS, PUD, GERD  Controlled,Status post dilation of esophageal narrowing ?  ?Endo/Other  ?negative endocrine ROS ? Renal/GU ?  ? ?  ?Musculoskeletal ? ?(+) Arthritis , Rheumatoid disorders,   ? Abdominal ?Normal abdominal exam  (+)   ?Peds ? Hematology ?negative hematology ROS ?(+)   ?Anesthesia Other Findings ?Past Medical History: ?No date: Allergy ?No date: Arthritis ?    Comment:  all over and right wrist ?No date: Basal cell cancer ?10/2020: COVID-19 ?No date: Diverticulosis ?    Comment:  H/O ?No date: GERD (gastroesophageal reflux disease) ?No date: Heart murmur ?No date: Hypertension ?No date: IBS (irritable bowel syndrome) ?No date: Migraine ?    Comment:  H/O, none since stopping chocolate ?No date: Osteoporosis ?No date: PONV (postoperative nausea and vomiting) ?No date: Rheumatic fever ?    Comment:  H/O ?08/2020: Shingles ?No date: Status post dilation of esophageal narrowing ? ?Past Surgical History: ?1982: APPENDECTOMY ?09/2014: BREAST BIOPSY; Right ?    Comment:  axillary node- neg ?No date: CATARACT EXTRACTION, BILATERAL ?No date: CESAREAN SECTION ?2018: COLONOSCOPY ?No date: DILATION AND CURETTAGE OF UTERUS ?    Comment:   x2 ?No date: DILATION AND CURETTAGE, DIAGNOSTIC / THERAPEUTIC ?    Comment:  x 2 ?No date: fatty tumor ?    Comment:  on back x2 ?No date: HERNIA REPAIR ?No date: KNEE SURGERY; Right ?03/24/2021: MYRINGOTOMY WITH TUBE PLACEMENT; Bilateral ?    Comment:  Procedure: MYRINGOTOMY WITH TUBE PLACEMENT;  Surgeon:  ?             Carloyn Manner, MD;  Location: [facility];   ?             Service: ENT;  Laterality: Bilateral; ?No date: parathyroid gland one removed ?No date: PARATHYROIDECTOMY ?No date: SKIN CANCER EXCISION ?    Comment:  BCCA  ?No date: TOE SURGERY; Left ?    Comment:  joint replacement bit toe ?No date: TUBAL LIGATION ?No date: UMBILICAL HERNIA REPAIR ?No date: UPPER GI ENDOSCOPY ? ? ? ? Reproductive/Obstetrics ?negative OB ROS ? ?  ? ? ? ? ? ? ? ? ? ? ? ? ? ?  ?  ? ? ? ? ? ? ? ?Anesthesia Physical ? ?Anesthesia Plan ? ?ASA: 2 ? ?Anesthesia Plan: General  ? ?Post-op Pain Management:   ? ?Induction: Intravenous ? ?PONV Risk Score and Plan: Propofol infusion and TIVA ? ?Airway Management Planned: Natural Airway ? ?Additional Equipment:  ? ?Intra-op Plan:  ? ?Post-operative Plan:  ? ?Informed Consent: I have reviewed the patients History and Physical, chart, labs and discussed the procedure including the risks, benefits and alternatives for the proposed anesthesia with the patient or authorized representative who has indicated his/her understanding and  acceptance.  ? ? ? ?Dental advisory given ? ?Plan Discussed with: Anesthesiologist, CRNA and Surgeon ? ?Anesthesia Plan Comments: (Patient consented for risks of anesthesia including but not limited to:  ?- adverse reactions to medications ?- damage to eyes, teeth, lips or other oral mucosa ?- nerve damage due to positioning  ?- sore throat or hoarseness ?- Damage to heart, brain, nerves, lungs, other parts of body or loss of life ? ?Patient voiced understanding.)  ? ? ? ? ? ?Anesthesia Quick Evaluation ? ?

## 2021-08-17 NOTE — Transfer of Care (Signed)
Immediate Anesthesia Transfer of Care Note ? ?Patient: Victoria Moss ? ?Procedure(s) Performed: COLONOSCOPY WITH PROPOFOL ? ?Patient Location: PACU and Endoscopy Unit ? ?Anesthesia Type:General ? ?Level of Consciousness: drowsy and patient cooperative ? ?Airway & Oxygen Therapy: Patient Spontanous Breathing ? ?Post-op Assessment: Report given to RN and Post -op Vital signs reviewed and stable ? ?Post vital signs: Reviewed and stable ? ?Last Vitals:  ?Vitals Value Taken Time  ?BP 124/80 08/17/21 1141  ?Temp 36.7 ?C 08/17/21 1141  ?Pulse 88 08/17/21 1142  ?Resp 16 08/17/21 1142  ?SpO2 99 % 08/17/21 1142  ?Vitals shown include unvalidated device data. ? ?Last Pain:  ?Vitals:  ? 08/17/21 1141  ?TempSrc: Temporal  ?PainSc: 0-No pain  ?   ? ?  ? ?Complications: No notable events documented. ?

## 2021-08-17 NOTE — Interval H&P Note (Signed)
History and Physical Interval Note: ? ?08/17/2021 ?10:58 AM ? ?[name]  has presented today for surgery, with the diagnosis of FAMILY HX OF POLYPS IN COLON.  The various methods of treatment have been discussed with the patient and family. After consideration of risks, benefits and other options for treatment, the patient has consented to  Procedure(s): ?COLONOSCOPY WITH PROPOFOL (N/A) as a surgical intervention.  The patient's history has been reviewed, patient examined, no change in status, stable for surgery.  I have reviewed the patient's chart and labs.  Questions were answered to the patient's satisfaction.   ? ? ?Hilton Cork Zacaria Pousson ? ?Ok to proceed with colonoscopy ?

## 2021-08-17 NOTE — Op Note (Signed)
[facility] ?Gastroenterology ?Patient Name: Victoria Moss ?Procedure Date: 08/17/2021 10:57 AM ?MRN: [id number] ?Account #: [id number] ?Date of Birth: Sep 20, 1950 ?Admit Type: Outpatient ?Age: 71 ?Room: [facility] ENDO ROOM 1 ?Gender: Female ?Note Status: Finalized ?Instrument Name: Colonoscope [id number] ?Procedure:             Colonoscopy ?Indications:           Colon cancer screening in patient at increased risk:  ?                       Family history of 1st-degree relative with colon polyps ?Providers:             Andrey Farmer MD, MD ?Referring MD:          Einar Pheasant, MD (Referring MD) ?Medicines:             Monitored Anesthesia Care ?Complications:         No immediate complications. Estimated blood loss:  ?                       Minimal. ?Procedure:             Pre-Anesthesia Assessment: ?                       - Prior to the procedure, a History and Physical was  ?                       performed, and patient medications and allergies were  ?                       reviewed. The patient is competent. The risks and  ?                       benefits of the procedure and the sedation options and  ?                       risks were discussed with the patient. All questions  ?                       were answered and informed consent was obtained.  ?                       Patient identification and proposed procedure were  ?                       verified by the physician, the nurse, the  ?                       anesthesiologist, the anesthetist and the technician  ?                       in the endoscopy suite. Mental Status Examination:  ?                       alert and oriented. Airway Examination: normal  ?                       oropharyngeal airway and neck mobility. Respiratory  ?  Examination: clear to auscultation. CV Examination:  ?                       normal. Prophylactic Antibiotics: The patient does not  ?                       require prophylactic  antibiotics. Prior  ?                       Anticoagulants: The patient has taken no previous  ?                       anticoagulant or antiplatelet agents. ASA Grade  ?                       Assessment: II - A patient with mild systemic disease.  ?                       After reviewing the risks and benefits, the patient  ?                       was deemed in satisfactory condition to undergo the  ?                       procedure. The anesthesia plan was to use monitored  ?                       anesthesia care (MAC). Immediately prior to  ?                       administration of medications, the patient was  ?                       re-assessed for adequacy to receive sedatives. The  ?                       heart rate, respiratory rate, oxygen saturations,  ?                       blood pressure, adequacy of pulmonary ventilation, and  ?                       response to care were monitored throughout the  ?                       procedure. The physical status of the patient was  ?                       re-assessed after the procedure. ?                       After obtaining informed consent, the colonoscope was  ?                       passed under direct vision. Throughout the procedure,  ?                       the patient's blood pressure, pulse, and oxygen  ?  saturations were monitored continuously. The  ?                       Colonoscope was introduced through the anus and  ?                       advanced to the the cecum, identified by appendiceal  ?                       orifice and ileocecal valve. The colonoscopy was  ?                       technically difficult and complex due to a tortuous  ?                       colon. Successful completion of the procedure was  ?                       aided by applying abdominal pressure. The patient  ?                       tolerated the procedure well. The quality of the bowel  ?                       preparation was good. ?Findings: ?      The perianal and digital rectal examinations were normal. ?     A 6 mm polyp was found in the ascending colon. The polyp was sessile.  ?     The polyp was removed with a cold snare. Resection and retrieval were  ?     complete. Estimated blood loss was minimal. ?     Internal hemorrhoids were found during retroflexion. The hemorrhoids  ?     were Grade I (internal hemorrhoids that do not prolapse). ?     The exam was otherwise without abnormality on direct and retroflexion  ?     views. ?Impression:            - One 6 mm polyp in the ascending colon, removed with  ?                       a cold snare. Resected and retrieved. ?                       - Internal hemorrhoids. ?                       - The examination was otherwise normal on direct and  ?                       retroflexion views. ?Recommendation:        - Discharge patient to home. ?                       - Resume previous diet. ?                       - Continue present medications. ?                       - Await pathology results. ?                       -  Repeat colonoscopy for surveillance based on  ?                       pathology results. ?                       - Return to referring physician as previously  ?                       scheduled. ?Procedure Code(s):     --- Professional --- ?                       [phone], Colonoscopy, flexible; with removal of  ?                       tumor(s), polyp(s), or other lesion(s) by snare  ?                       technique ?Diagnosis Code(s):     --- Professional --- ?                       Z83.71, Family history of colonic polyps ?                       K63.5, Polyp of colon ?                       K64.0, First degree hemorrhoids ?CPT copyright 2019 American [employer] Association. All rights reserved. ?The codes documented in this report are preliminary and upon coder review may  ?be revised to meet current compliance requirements. ?Andrey Farmer MD, MD ?08/17/2021 11:41:10 AM ?Number of Addenda: 0 ?Note  Initiated On: 08/17/2021 10:57 AM ?Scope Withdrawal Time: 0 hours 10 minutes 10 seconds  ?Total Procedure Duration: 0 hours 32 minutes 24 seconds  ?Estimated Blood Loss:  Estimated blood loss was minimal. ?     [facility] ?

## 2021-08-17 NOTE — Anesthesia Postprocedure Evaluation (Signed)
Anesthesia Post Note ? ?Patient: Victoria Moss ? ?Procedure(s) Performed: COLONOSCOPY WITH PROPOFOL ? ?Patient location during evaluation: Endoscopy ?Anesthesia Type: General ?Level of consciousness: awake and alert ?Pain management: pain level controlled ?Vital Signs Assessment: post-procedure vital signs reviewed and stable ?Respiratory status: spontaneous breathing, nonlabored ventilation and respiratory function stable ?Cardiovascular status: blood pressure returned to baseline and stable ?Postop Assessment: no apparent nausea or vomiting ?Anesthetic complications: no ? ? ?No notable events documented. ? ? ?Last Vitals:  ?Vitals:  ? 08/17/21 1151 08/17/21 1201  ?BP: 129/86 123/75  ?Pulse: 76   ?Resp:  12  ?Temp:    ?SpO2: 100% 100%  ?  ?Last Pain:  ?Vitals:  ? 08/17/21 1151  ?TempSrc:   ?PainSc: 0-No pain  ? ? ?  ?  ?  ?  ?  ?  ? ?[place] Ouch ? ? ? ? ?

## 2021-08-17 NOTE — H&P (Signed)
Outpatient short stay form Pre-procedure ?08/17/2021  ?Lesly Rubenstein, MD ? ?Primary Physician: Einar Pheasant, MD ? ?Reason for visit:  Screening, mother had polyps ? ?History of present illness:   ? ?71 y/o lady with seronegative RA, hypertension, and IBS-M here for screening colonoscopy. Mother with history of polyps. No blood thinners. History of appendectomy and ventral hernia repair. No family history of GI malignancies. ? ? ? ?Current Facility-Administered Medications:  ?  0.9 %  sodium chloride infusion, , Intravenous, Continuous, Anav Lammert, Hilton Cork, MD, Last Rate: 20 mL/hr at 08/17/21 1045, New Bag at 08/17/21 1045 ? ?Medications Prior to Admission  ?Medication Sig Dispense Refill Last Dose  ? amLODipine (NORVASC) 5 MG tablet TAKE ONE TABLET BY MOUTH EVERY DAY 30 tablet 5 08/16/2021  ? DOCOSAHEXAENOIC ACID-EPA PO Take by mouth daily.     ? famotidine (PEPCID) 20 MG tablet Take 20 mg by mouth every evening.   08/16/2021  ? fluticasone (FLONASE) 50 MCG/ACT nasal spray Place into both nostrils daily.     ? folic acid (FOLVITE) 1 MG tablet Take 1 mg by mouth daily.   08/17/2021  ? losartan (COZAAR) 100 MG tablet TAKE 1 TABLET BY MOUTH EVERY DAY 30 tablet 3 08/17/2021  ? methotrexate (RHEUMATREX) 2.5 MG tablet Take 15 mg by mouth every Friday.   08/17/2021  ? RABEprazole (ACIPHEX) 20 MG tablet Take 20 mg by mouth daily.   08/17/2021  ? acetaminophen (TYLENOL) 500 MG tablet Take 1,000 mg by mouth 2 (two) times daily as needed for moderate pain or headache.     ? Calcium Carb-Cholecalciferol (CALCIUM 600+D) 600-800 MG-UNIT TABS Take 1 tablet by mouth daily.     ? cyclobenzaprine (FLEXERIL) 5 MG tablet TAKE ONE TABLET AT BEDTIME AS NEEDED FORMUSCLE SPASM 30 tablet 0   ? dicyclomine (BENTYL) 10 MG capsule TAKE 1 CAPSULE BY MOUTH ONCE DAILY AS NEEDED AS DIRECTED BY PHYSICIAN (Patient taking differently: Take 10 mg by mouth daily as needed for spasms.) 90 capsule 1   ? Melatonin 5 MG CAPS Take 5 mg by mouth at bedtime.      ? Multiple Vitamin (MULTI-VITAMINS) TABS Take 1 tablet by mouth daily.      ? Omega-3 Fatty Acids (FISH OIL) 1200 MG CAPS Take 1,200 mg by mouth daily.     ? rosuvastatin (CRESTOR) 5 MG tablet TAKE ONE TABLET 3 TIMES A WEEK 15 tablet 5   ? Turmeric Curcumin 500 MG CAPS Take 500 mg by mouth daily.     ? ? ? ?Allergies  ?Allergen Reactions  ? Codeine Itching  ? Lac Bovis Diarrhea  ?  Abdominal Pain (Lactose Intolerant)  ? Penicillins Itching  ?  Did it involve swelling of the face/tongue/throat, SOB, or low BP? No ?Did it involve sudden or severe rash/hives, skin peeling, or any reaction on the inside of your mouth or nose? No ?Did you need to seek medical attention at a hospital or doctor's office? No ?When did it last happen?     40 + years ago  ?If all above answers are ?NO?, may proceed with cephalosporin use. ?  ? ? ? ?Past Medical History:  ?Diagnosis Date  ? Allergy   ? Arthritis   ? all over and right wrist  ? Basal cell cancer   ? COVID-19 10/2020  ? Diverticulosis   ? H/O  ? GERD (gastroesophageal reflux disease)   ? Heart murmur   ? Hypertension   ? IBS (irritable bowel syndrome)   ?  Migraine   ? H/O, none since stopping chocolate  ? Osteoporosis   ? PONV (postoperative nausea and vomiting)   ? Renal artery stenosis ([place])   ? Rheumatic fever   ? H/O  ? Shingles 08/2020  ? Status post dilation of esophageal narrowing   ? ? ?Review of systems:  Otherwise negative.  ? ? ?Physical Exam ? ?Gen: Alert, oriented. Appears stated age.  ?HEENT: PERRLA. ?Lungs: No respiratory distress ?CV: RRR ?Abd: soft, benign, no masses ?Ext: No edema ? ? ? ?Planned procedures: Proceed with colonoscopy. The patient understands the nature of the planned procedure, indications, risks, alternatives and potential complications including but not limited to bleeding, infection, perforation, damage to internal organs and possible oversedation/side effects from anesthesia. The patient agrees and gives consent to proceed.  ?Please refer  to procedure notes for findings, recommendations and patient disposition/instructions.  ? ? ? ?Lesly Rubenstein, MD ?[facility] ? ? ? ?  ? ?

## 2021-08-18 ENCOUNTER — Encounter: Payer: Self-pay | Admitting: Gastroenterology

## 2021-08-18 LAB — SURGICAL PATHOLOGY

## 2021-08-23 ENCOUNTER — Other Ambulatory Visit: Payer: Self-pay | Admitting: Internal Medicine

## 2021-08-26 ENCOUNTER — Other Ambulatory Visit: Payer: Self-pay | Admitting: Internal Medicine

## 2021-08-26 DIAGNOSIS — Z1231 Encounter for screening mammogram for malignant neoplasm of breast: Secondary | ICD-10-CM

## 2021-09-02 DIAGNOSIS — Z20822 Contact with and (suspected) exposure to covid-19: Secondary | ICD-10-CM | POA: Diagnosis not present

## 2021-09-08 DIAGNOSIS — Z20822 Contact with and (suspected) exposure to covid-19: Secondary | ICD-10-CM | POA: Diagnosis not present

## 2021-09-30 ENCOUNTER — Other Ambulatory Visit: Payer: Medicare Other

## 2021-10-02 ENCOUNTER — Other Ambulatory Visit: Payer: Self-pay | Admitting: Internal Medicine

## 2021-10-06 DIAGNOSIS — M25461 Effusion, right knee: Secondary | ICD-10-CM | POA: Diagnosis not present

## 2021-10-06 DIAGNOSIS — M25561 Pain in right knee: Secondary | ICD-10-CM | POA: Diagnosis not present

## 2021-10-06 DIAGNOSIS — M1711 Unilateral primary osteoarthritis, right knee: Secondary | ICD-10-CM | POA: Diagnosis not present

## 2021-10-07 ENCOUNTER — Other Ambulatory Visit (INDEPENDENT_AMBULATORY_CARE_PROVIDER_SITE_OTHER): Payer: Medicare Other

## 2021-10-07 DIAGNOSIS — I10 Essential (primary) hypertension: Secondary | ICD-10-CM | POA: Diagnosis not present

## 2021-10-07 LAB — TSH: TSH: 0.73 u[IU]/mL (ref 0.35–5.50)

## 2021-10-07 LAB — HEPATIC FUNCTION PANEL
ALT: 38 U/L — ABNORMAL HIGH (ref 0–35)
AST: 24 U/L (ref 0–37)
Albumin: 4.6 g/dL (ref 3.5–5.2)
Alkaline Phosphatase: 55 U/L (ref 39–117)
Bilirubin, Direct: 0.1 mg/dL (ref 0.0–0.3)
Total Bilirubin: 0.6 mg/dL (ref 0.2–1.2)
Total Protein: 6.7 g/dL (ref 6.0–8.3)

## 2021-10-07 LAB — BASIC METABOLIC PANEL
BUN: 14 mg/dL (ref 6–23)
CO2: 28 mEq/L (ref 19–32)
Calcium: 9.5 mg/dL (ref 8.4–10.5)
Chloride: 106 mEq/L (ref 96–112)
Creatinine, Ser: 0.81 mg/dL (ref 0.40–1.20)
GFR: 73.04 mL/min (ref >60.00)
Glucose, Bld: 85 mg/dL (ref 70–99)
Potassium: 3.8 mEq/L (ref 3.5–5.1)
Sodium: 144 mEq/L (ref 135–145)

## 2021-10-07 LAB — LIPID PANEL
Cholesterol: 156 mg/dL (ref 0–200)
HDL: 60 mg/dL (ref >39.00)
LDL Cholesterol: 72 mg/dL (ref 0–99)
NonHDL: 96.34
Total CHOL/HDL Ratio: 3
Triglycerides: 120 mg/dL (ref 0.0–149.0)
VLDL: 24 mg/dL (ref 0.0–40.0)

## 2021-10-08 ENCOUNTER — Other Ambulatory Visit: Payer: Self-pay

## 2021-10-08 DIAGNOSIS — R748 Abnormal levels of other serum enzymes: Secondary | ICD-10-CM

## 2021-10-12 DIAGNOSIS — H6983 Other specified disorders of Eustachian tube, bilateral: Secondary | ICD-10-CM | POA: Diagnosis not present

## 2021-10-12 DIAGNOSIS — H6121 Impacted cerumen, right ear: Secondary | ICD-10-CM | POA: Diagnosis not present

## 2021-10-14 IMAGING — MG DIGITAL SCREENING BILAT W/ TOMO W/ CAD
8 series · 9 of 24 positions shown · non-contrast
Comparison: Previous exam(s).

CLINICAL DATA: Screening.

EXAM:
DIGITAL SCREENING BILATERAL MAMMOGRAM WITH TOMO AND CAD

[R MLO synth-2D]
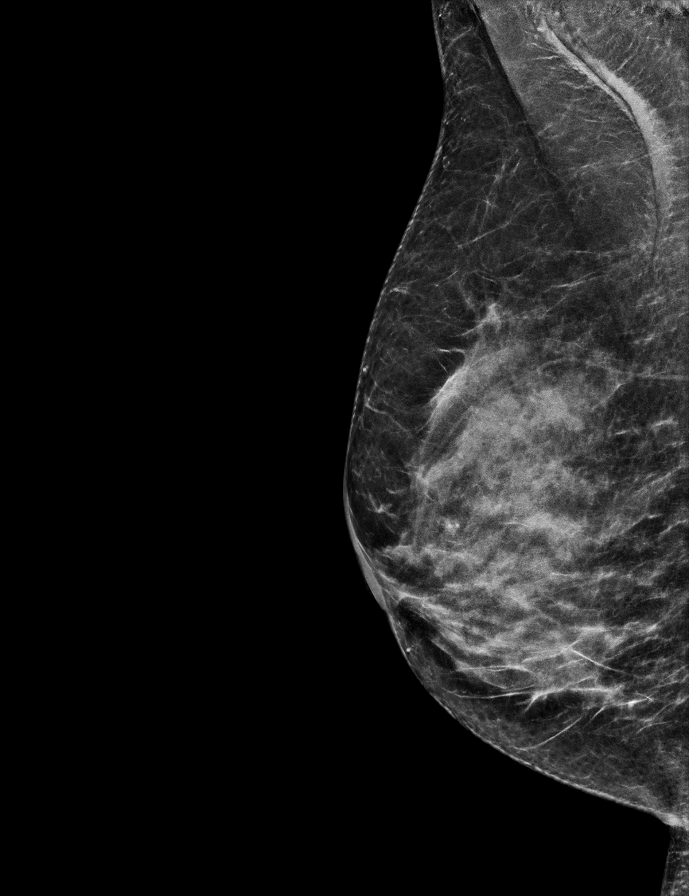

[L CC synth-2D]
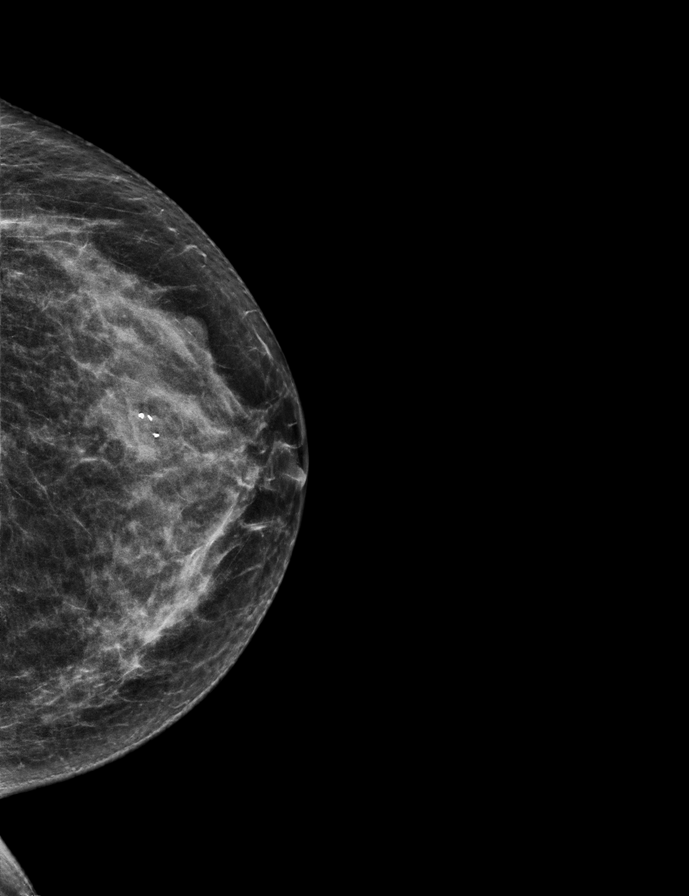

[L MLO synth-2D]
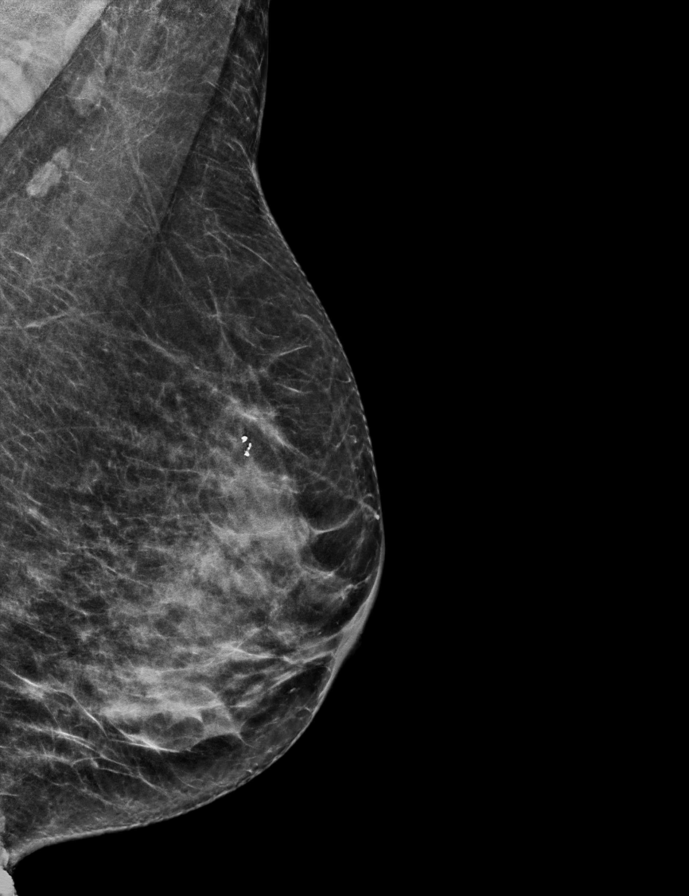

[R CC synth-2D]
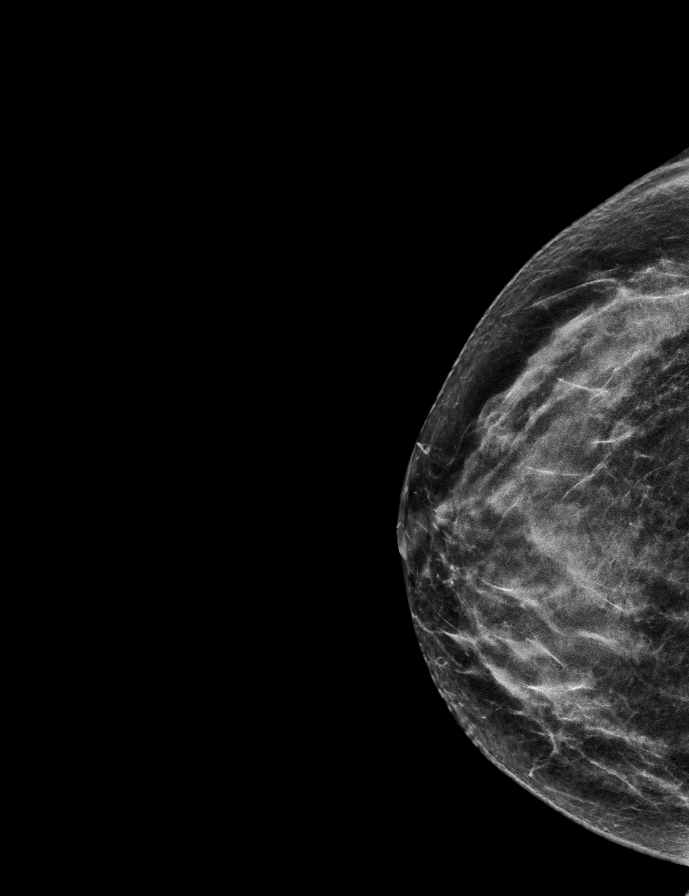

[L MLO tomo · 2 of 55 frames shown]
[frame 18/55]
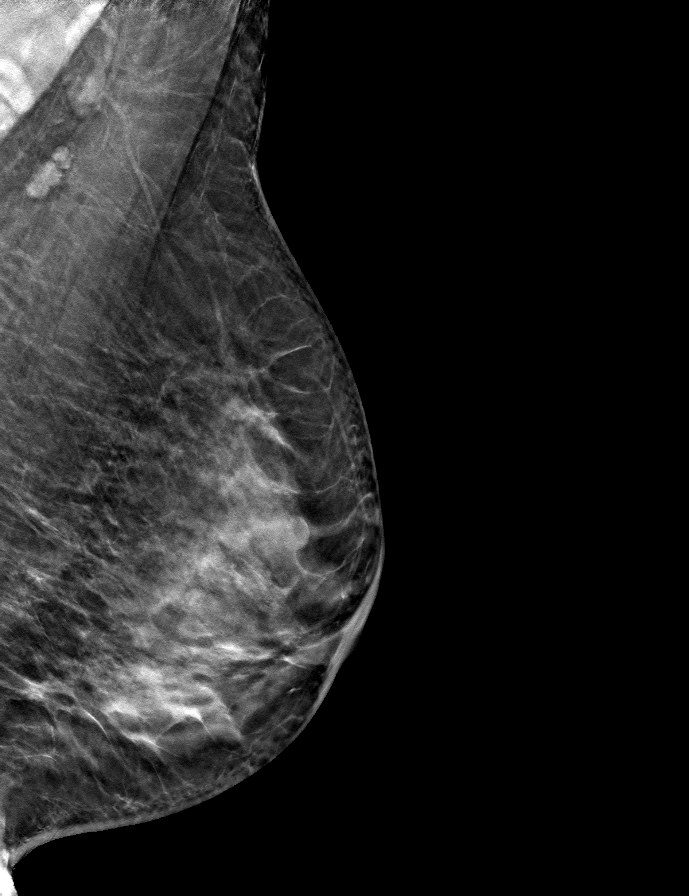
[frame 28/55]
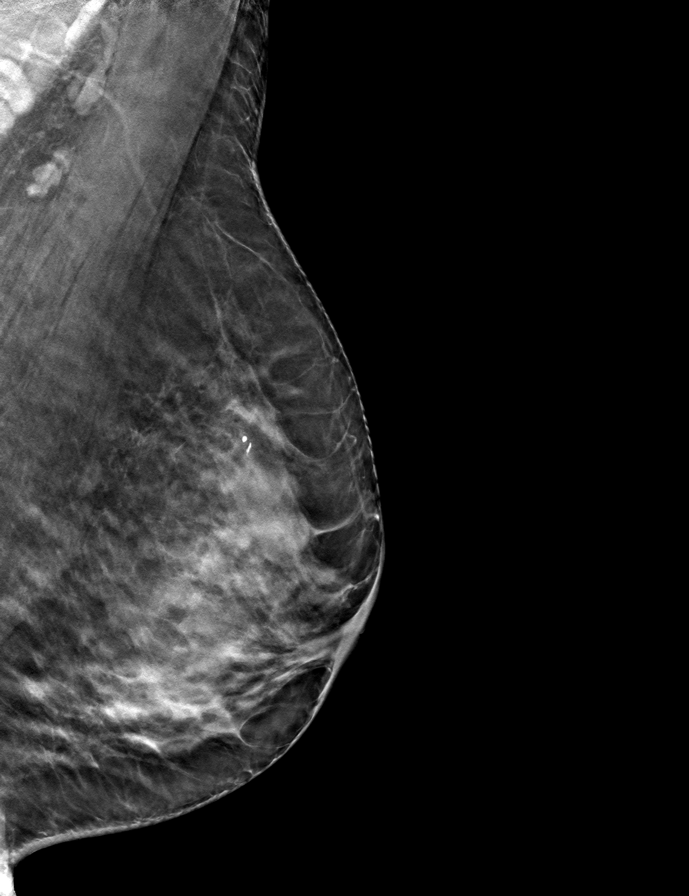

[L CC tomo · tomo slice 29/56.0]
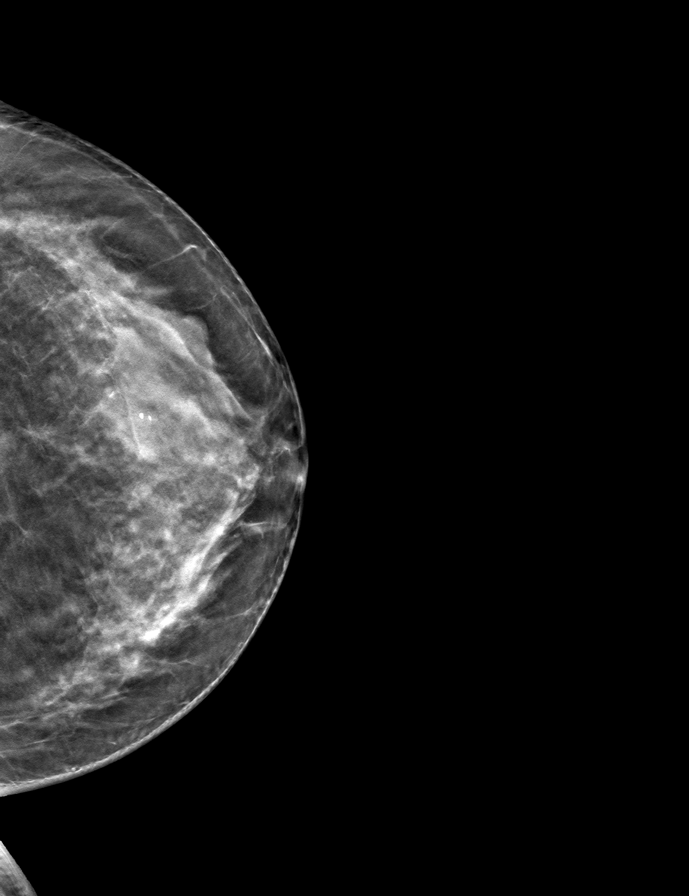

[R MLO tomo · tomo slice 27/53.0]
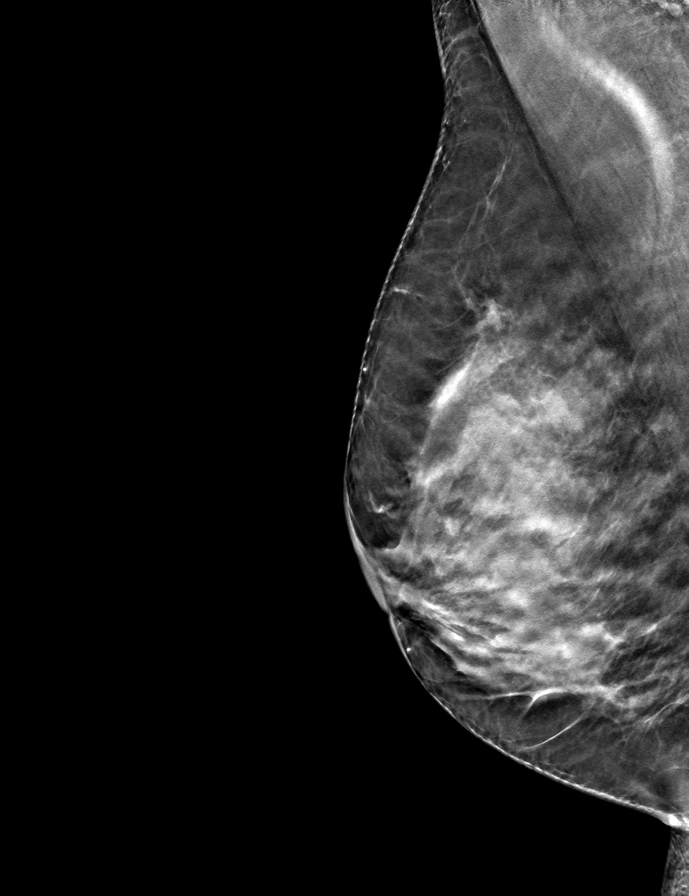

[R CC tomo · tomo slice 28/55.0]
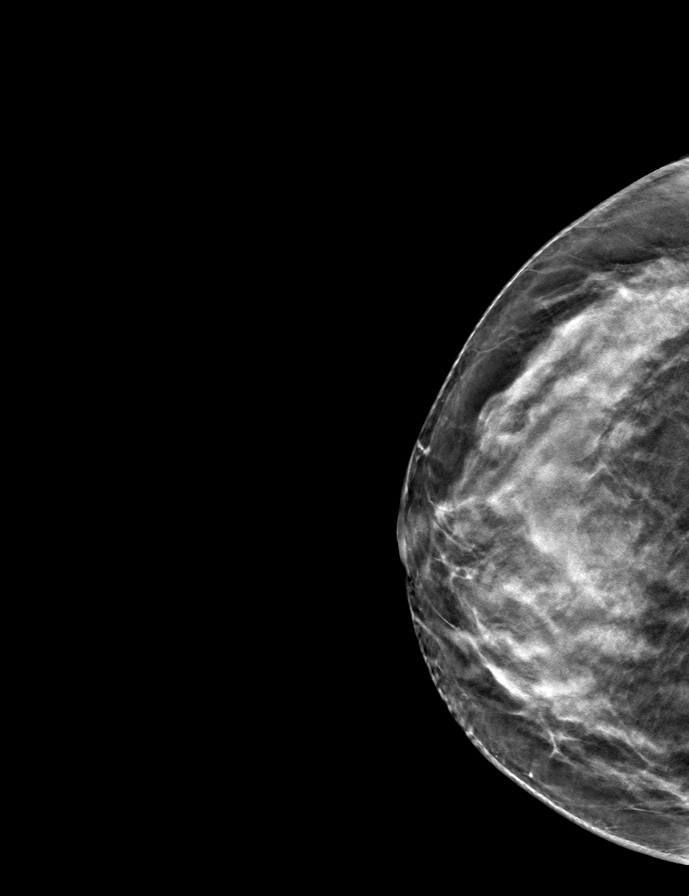

[9 of 24 positions shown; findings below may reference images not displayed]

ACR Breast Density Category d: The breast tissue is extremely dense,
which lowers the sensitivity of mammography
FINDINGS: There are no findings suspicious for malignancy. Images were
processed with CAD.
IMPRESSION: No mammographic evidence of malignancy. A result letter of this
screening mammogram will be mailed directly to the patient.

RECOMMENDATION:
Screening mammogram in one year. (Code:WO-0-ZI0)

BI-RADS CATEGORY  1: Negative.

## 2021-10-18 ENCOUNTER — Ambulatory Visit
Admission: RE | Admit: 2021-10-18 | Discharge: 2021-10-18 | Disposition: A | Discharge status: Home / Self Care | Payer: Medicare Other | Source: Ambulatory Visit | Attending: Internal Medicine | Admitting: Internal Medicine

## 2021-10-18 DIAGNOSIS — Z1231 Encounter for screening mammogram for malignant neoplasm of breast: Secondary | ICD-10-CM | POA: Diagnosis not present

## 2021-10-27 ENCOUNTER — Other Ambulatory Visit: Payer: Self-pay | Admitting: Internal Medicine

## 2021-10-28 ENCOUNTER — Telehealth: Payer: Self-pay | Admitting: Internal Medicine

## 2021-10-28 NOTE — Telephone Encounter (Signed)
Copied from [place] [phone]. Topic: Medicare AWV >> Oct 28, 2021 10:54 AM Devoria Glassing wrote: Reason for CRM: Left message for patient to schedule Annual Wellness Visit.  Please schedule with Nurse Health Advisor Denisa O'Brien-Blaney, LPN at [facility].  Please call [phone] ask for [facility]

## 2021-11-02 ENCOUNTER — Other Ambulatory Visit (INDEPENDENT_AMBULATORY_CARE_PROVIDER_SITE_OTHER): Payer: Medicare Other

## 2021-11-02 DIAGNOSIS — R748 Abnormal levels of other serum enzymes: Secondary | ICD-10-CM | POA: Diagnosis not present

## 2021-11-02 LAB — HEPATIC FUNCTION PANEL
ALT: 45 U/L — ABNORMAL HIGH (ref 0–35)
AST: 90 U/L — ABNORMAL HIGH (ref 0–37)
Albumin: 4.9 g/dL (ref 3.5–5.2)
Alkaline Phosphatase: 63 U/L (ref 39–117)
Bilirubin, Direct: 0.2 mg/dL (ref 0.0–0.3)
Total Bilirubin: 0.8 mg/dL (ref 0.2–1.2)
Total Protein: 6.9 g/dL (ref 6.0–8.3)

## 2021-11-03 ENCOUNTER — Other Ambulatory Visit: Payer: Self-pay | Admitting: Internal Medicine

## 2021-11-03 ENCOUNTER — Other Ambulatory Visit: Payer: Self-pay

## 2021-11-03 DIAGNOSIS — R7989 Other specified abnormal findings of blood chemistry: Secondary | ICD-10-CM

## 2021-11-03 DIAGNOSIS — R748 Abnormal levels of other serum enzymes: Secondary | ICD-10-CM

## 2021-11-03 NOTE — Progress Notes (Signed)
Order placed for abdominal ultrasound.   

## 2021-11-10 ENCOUNTER — Other Ambulatory Visit (INDEPENDENT_AMBULATORY_CARE_PROVIDER_SITE_OTHER): Payer: Medicare Other

## 2021-11-10 DIAGNOSIS — R748 Abnormal levels of other serum enzymes: Secondary | ICD-10-CM

## 2021-11-10 LAB — HEPATIC FUNCTION PANEL
ALT: 29 U/L (ref 0–35)
AST: 25 U/L (ref 0–37)
Albumin: 4.8 g/dL (ref 3.5–5.2)
Alkaline Phosphatase: 64 U/L (ref 39–117)
Bilirubin, Direct: 0.1 mg/dL (ref 0.0–0.3)
Total Bilirubin: 0.6 mg/dL (ref 0.2–1.2)
Total Protein: 6.4 g/dL (ref 6.0–8.3)

## 2021-11-12 ENCOUNTER — Ambulatory Visit
Admission: RE | Admit: 2021-11-12 | Discharge: 2021-11-12 | Disposition: A | Discharge status: Home / Self Care | Payer: Medicare Other | Source: Ambulatory Visit | Attending: Internal Medicine | Admitting: Internal Medicine

## 2021-11-12 DIAGNOSIS — R7989 Other specified abnormal findings of blood chemistry: Secondary | ICD-10-CM | POA: Insufficient documentation

## 2021-11-12 DIAGNOSIS — N281 Cyst of kidney, acquired: Secondary | ICD-10-CM | POA: Diagnosis not present

## 2021-11-12 DIAGNOSIS — R945 Abnormal results of liver function studies: Secondary | ICD-10-CM | POA: Diagnosis not present

## 2021-11-16 DIAGNOSIS — M189 Osteoarthritis of first carpometacarpal joint, unspecified: Secondary | ICD-10-CM | POA: Diagnosis not present

## 2021-11-17 ENCOUNTER — Other Ambulatory Visit: Payer: Self-pay

## 2021-11-17 DIAGNOSIS — R748 Abnormal levels of other serum enzymes: Secondary | ICD-10-CM

## 2021-11-19 ENCOUNTER — Telehealth: Payer: Self-pay | Admitting: Internal Medicine

## 2021-11-19 NOTE — Telephone Encounter (Signed)
Spoke with patient she state she WCB in Aug 2023

## 2021-11-24 DIAGNOSIS — D2262 Melanocytic nevi of left upper limb, including shoulder: Secondary | ICD-10-CM | POA: Diagnosis not present

## 2021-11-24 DIAGNOSIS — D2272 Melanocytic nevi of left lower limb, including hip: Secondary | ICD-10-CM | POA: Diagnosis not present

## 2021-11-24 DIAGNOSIS — D2261 Melanocytic nevi of right upper limb, including shoulder: Secondary | ICD-10-CM | POA: Diagnosis not present

## 2021-11-24 DIAGNOSIS — Z85828 Personal history of other malignant neoplasm of skin: Secondary | ICD-10-CM | POA: Diagnosis not present

## 2021-11-24 DIAGNOSIS — S80862A Insect bite (nonvenomous), left lower leg, initial encounter: Secondary | ICD-10-CM | POA: Diagnosis not present

## 2021-11-24 DIAGNOSIS — S70361A Insect bite (nonvenomous), right thigh, initial encounter: Secondary | ICD-10-CM | POA: Diagnosis not present

## 2021-12-08 ENCOUNTER — Other Ambulatory Visit (INDEPENDENT_AMBULATORY_CARE_PROVIDER_SITE_OTHER): Payer: Medicare Other

## 2021-12-08 DIAGNOSIS — R748 Abnormal levels of other serum enzymes: Secondary | ICD-10-CM

## 2021-12-09 LAB — HEPATIC FUNCTION PANEL
ALT: 34 U/L (ref 0–35)
AST: 24 U/L (ref 0–37)
Albumin: 4.4 g/dL (ref 3.5–5.2)
Alkaline Phosphatase: 65 U/L (ref 39–117)
Bilirubin, Direct: 0.1 mg/dL (ref 0.0–0.3)
Total Bilirubin: 0.4 mg/dL (ref 0.2–1.2)
Total Protein: 6.3 g/dL (ref 6.0–8.3)

## 2021-12-09 NOTE — Progress Notes (Signed)
71 y.o. G46P2012 Widowed White or Caucasian Not Hispanic or Latino female here for breast and pelvic exam.  No vaginal bleeding.   No bowel or bladder changes. Has IBS.    H/O osteoporosis, treated with reclast. Last DEXA with osteopenia, off reclast.  Planning to [place] to be near her daughter.   She is going to have to have a right knee replacement.   Patient's last menstrual period was 05/09/2006 (approximate).          Sexually active: No.  The current method of family planning is post menopausal status.    Exercising: Yes.     Personal trainer and yard work.  Smoker:  no  Health Maintenance: Pap:  11/22/2018 WNL          11/17/2016 neg.          08/07/14 neg. HR HPV:neg History of abnormal Pap:  no MMG:  10/19/21 1 Neg, cat d BMD:   12/23/20 osteopenia, T score -2.3 FRAX 13/1.9%  Colonoscopy: 08/17/21 f/u 10 years  TDaP:  10/21/14 Gardasil: n/a   reports that she quit smoking about 48 years ago. Her smoking use included cigarettes. She has never used smokeless tobacco. She reports that she does not drink alcohol and does not use drugs. Daughter in [place] has a 55.5 year old baby girl and has an almost 41 year old boy. Other daughter lives in [place], married, no kids.   Past Medical History:  Diagnosis Date   Allergy    Arthritis    all over and right wrist   Basal cell cancer    COVID-19 10/2020   Diverticulosis    H/O   GERD (gastroesophageal reflux disease)    Heart murmur    Hypertension    IBS (irritable bowel syndrome)    Migraine    H/O, none since stopping chocolate   Osteoporosis    PONV (postoperative nausea and vomiting)    Renal artery stenosis ([facility])    Rheumatic fever    H/O   Shingles 08/2020   Status post dilation of esophageal narrowing     Past Surgical History:  Procedure Laterality Date   APPENDECTOMY  1982   BREAST BIOPSY Right 09/2014   axillary node- neg   CATARACT EXTRACTION, BILATERAL     CESAREAN SECTION     COLONOSCOPY  2018   COLONOSCOPY      COLONOSCOPY WITH PROPOFOL N/A 08/17/2021   Procedure: COLONOSCOPY WITH PROPOFOL;  Surgeon: Lesly Rubenstein, MD;  Location: [facility] ENDOSCOPY;  Service: Endoscopy;  Laterality: N/A;   DILATION AND CURETTAGE OF UTERUS     x2   DILATION AND CURETTAGE, DIAGNOSTIC / THERAPEUTIC     x 2   fatty tumor     on back x2   HERNIA REPAIR     KNEE SURGERY Right    MASS EXCISION Left 07/30/2021   Procedure: EXCISION MASS, soft tissue left lumbar back;  Surgeon: Ronny Bacon, MD;  Location: [facility] ORS;  Service: General;  Laterality: Left;   MYRINGOTOMY WITH TUBE PLACEMENT Bilateral 03/24/2021   Procedure: MYRINGOTOMY WITH TUBE PLACEMENT;  Surgeon: Carloyn Manner, MD;  Location: [facility];  Service: ENT;  Laterality: Bilateral;   parathyroid gland one removed     PARATHYROIDECTOMY     SKIN CANCER EXCISION     BCCA    TOE SURGERY Left    joint replacement bit toe   TUBAL LIGATION     UMBILICAL HERNIA REPAIR     UPPER  GI ENDOSCOPY      Current Outpatient Medications  Medication Sig Dispense Refill   acetaminophen (TYLENOL) 500 MG tablet Take 1,000 mg by mouth 2 (two) times daily as needed for moderate pain or headache.     amLODipine (NORVASC) 5 MG tablet TAKE ONE TABLET BY MOUTH EVERY DAY 30 tablet 5   Calcium Carb-Cholecalciferol (CALCIUM 600+D) 600-800 MG-UNIT TABS Take 1 tablet by mouth daily.     cyclobenzaprine (FLEXERIL) 5 MG tablet TAKE ONE TABLET AT BEDTIME AS NEEDED FORMUSCLE SPASM 30 tablet 0   dicyclomine (BENTYL) 10 MG capsule TAKE 1 CAPSULE BY MOUTH ONCE DAILY AS NEEDED AS DIRECTED BY PHYSICIAN (Patient taking differently: Take 10 mg by mouth daily as needed for spasms.) 90 capsule 1   DOCOSAHEXAENOIC ACID-EPA PO Take by mouth daily.     famotidine (PEPCID) 20 MG tablet Take 20 mg by mouth every evening.     fluticasone (FLONASE) 50 MCG/ACT nasal spray Place into both nostrils daily.     folic acid (FOLVITE) 1 MG tablet Take 1 mg by mouth daily.     losartan (COZAAR)  100 MG tablet TAKE 1 TABLET BY MOUTH EVERY DAY 30 tablet 3   Melatonin 5 MG CAPS Take 5 mg by mouth at bedtime.     methotrexate (RHEUMATREX) 2.5 MG tablet Take 15 mg by mouth every Friday.     Multiple Vitamin (MULTI-VITAMINS) TABS Take 1 tablet by mouth daily.      Omega-3 Fatty Acids (FISH OIL) 1200 MG CAPS Take 1,200 mg by mouth daily.     RABEprazole (ACIPHEX) 20 MG tablet Take 20 mg by mouth daily.     rosuvastatin (CRESTOR) 5 MG tablet TAKE ONE TABLET 3 TIMES A WEEK 15 tablet 5   Turmeric Curcumin 500 MG CAPS Take 500 mg by mouth daily.     No current facility-administered medications for this visit.    Family History  Problem Relation Age of Onset   Breast cancer Mother 62   Hypertension Mother    Stroke Mother        Late 38   Arthritis Mother    Colon polyps Mother    Cancer Mother        Breast   Hypertension Father    Hyperlipidemia Father    Dementia Father    COPD Father    Congestive Heart Failure Father    Diabetes Neg Hx    Colon cancer Neg Hx    Esophageal cancer Neg Hx    Kidney disease Neg Hx    Gallbladder disease Neg Hx     Review of Systems  All other systems reviewed and are negative.   Exam:   BP 128/62   Pulse 72   Ht 5' 5.75" (1.67 m)   Wt 139 lb (63 kg)   LMP 05/09/2006 (Approximate)   SpO2 100%   BMI 22.61 kg/m   Weight change: '@WEIGHTCHANGE'$ @ Height:   Height: 5' 5.75" (167 cm)  Ht Readings from Last 3 Encounters:  12/16/21 5' 5.75" (1.67 m)  12/13/21 '5\' 5"'$  (1.651 m)  08/17/21 '5\' 5"'$  (1.651 m)    General appearance: alert, cooperative and appears stated age Head: Normocephalic, without obvious abnormality, atraumatic Neck: no adenopathy, supple, symmetrical, trachea midline and thyroid normal to inspection and palpation Lungs: clear to auscultation bilaterally Cardiovascular: regular rate and rhythm Breasts: normal appearance, no masses or tenderness Abdomen: soft, non-tender; non distended,  no masses,  no  organomegaly Extremities: extremities normal, atraumatic, no  cyanosis or edema Skin: Skin color, texture, turgor normal. No rashes or lesions Lymph nodes: Cervical, supraclavicular, and axillary nodes normal. No abnormal inguinal nodes palpated Neurologic: Grossly normal   Pelvic: External genitalia:  no lesions              Urethra:  normal appearing urethra with no masses, tenderness or lesions              Bartholins and Skenes: normal                 Vagina: normal appearing vagina with normal color and discharge, no lesions              Cervix: no lesions               Bimanual Exam:  Uterus:  normal size, contour, position, consistency, mobility, non-tender              Adnexa: no mass, fullness, tenderness               Rectovaginal: Confirms               Anus:  normal sphincter tone, no lesions  Gae Dry, CMA chaperoned for the exam.   1. Encounter for breast and pelvic examination Discussed breast self exam Mammogram and colonoscopy UTD Labs with primary No pap today  2. History of osteopenia Discussed calcium and vit D intake DEXA due, she will do with her primary

## 2021-12-13 ENCOUNTER — Encounter: Payer: Self-pay | Admitting: Internal Medicine

## 2021-12-13 ENCOUNTER — Ambulatory Visit (INDEPENDENT_AMBULATORY_CARE_PROVIDER_SITE_OTHER): Payer: Medicare Other | Admitting: Internal Medicine

## 2021-12-13 DIAGNOSIS — I779 Disorder of arteries and arterioles, unspecified: Secondary | ICD-10-CM | POA: Diagnosis not present

## 2021-12-13 DIAGNOSIS — K219 Gastro-esophageal reflux disease without esophagitis: Secondary | ICD-10-CM

## 2021-12-13 DIAGNOSIS — I10 Essential (primary) hypertension: Secondary | ICD-10-CM

## 2021-12-13 DIAGNOSIS — M069 Rheumatoid arthritis, unspecified: Secondary | ICD-10-CM | POA: Diagnosis not present

## 2021-12-13 DIAGNOSIS — E782 Mixed hyperlipidemia: Secondary | ICD-10-CM | POA: Diagnosis not present

## 2021-12-13 DIAGNOSIS — I701 Atherosclerosis of renal artery: Secondary | ICD-10-CM

## 2021-12-13 DIAGNOSIS — G479 Sleep disorder, unspecified: Secondary | ICD-10-CM | POA: Diagnosis not present

## 2021-12-13 DIAGNOSIS — I773 Arterial fibromuscular dysplasia: Secondary | ICD-10-CM | POA: Diagnosis not present

## 2021-12-13 DIAGNOSIS — E210 Primary hyperparathyroidism: Secondary | ICD-10-CM

## 2021-12-13 DIAGNOSIS — R739 Hyperglycemia, unspecified: Secondary | ICD-10-CM | POA: Diagnosis not present

## 2021-12-13 NOTE — Progress Notes (Signed)
Patient ID: Victoria Moss, female   DOB: 1950-07-25, 71 y.o.   MRN: [id number]   Subjective:    Patient ID: Victoria Moss, female    DOB: 05-27-1950, 71 y.o.   MRN: [id number]   Patient here for a scheduled follow up. Marland Kitchen   HPI Here to follow up regarding her blood pressure and cholesterol. Still having issues with sleep.  Taking melatonin and has used benadryl.  Has muscle relaxer.  Started exercising more.  Working with Physiological scientist.  No chest pain.  Breathing stable.  No cough or congestion. No abdominal pain.  Bowels moving.   Seeing ortho for her knee.   Planning f/u - ablation. Discussed sleep.  Discussed frequent urination, snoring and not feeling rested when wakes up in am.  Discussed sleep apnea.  Has f/u with rheumatology tomorrow.  Has f/u with gyn end of week.    Past Medical History:  Diagnosis Date   Allergy    Arthritis    all over and right wrist   Basal cell cancer    COVID-19 10/2020   Diverticulosis    H/O   GERD (gastroesophageal reflux disease)    Heart murmur    Hypertension    IBS (irritable bowel syndrome)    Migraine    H/O, none since stopping chocolate   Osteoporosis    PONV (postoperative nausea and vomiting)    Renal artery stenosis (HCC)    Rheumatic fever    H/O   Shingles 08/2020   Status post dilation of esophageal narrowing    Past Surgical History:  Procedure Laterality Date   APPENDECTOMY  1982   BREAST BIOPSY Right 09/2014   axillary node- neg   CATARACT EXTRACTION, BILATERAL     CESAREAN SECTION     COLONOSCOPY  2018   COLONOSCOPY     COLONOSCOPY WITH PROPOFOL N/A 08/17/2021   Procedure: COLONOSCOPY WITH PROPOFOL;  Surgeon: Lesly Rubenstein, MD;  Location: [facility];  Service: Endoscopy;  Laterality: N/A;   DILATION AND CURETTAGE OF UTERUS     x2   DILATION AND CURETTAGE, DIAGNOSTIC / THERAPEUTIC     x 2   fatty tumor     on back x2   HERNIA REPAIR     KNEE SURGERY Right    MASS EXCISION Left  07/30/2021   Procedure: EXCISION MASS, soft tissue left lumbar back;  Surgeon: Ronny Bacon, MD;  Location: [facility];  Service: General;  Laterality: Left;   MYRINGOTOMY WITH TUBE PLACEMENT Bilateral 03/24/2021   Procedure: MYRINGOTOMY WITH TUBE PLACEMENT;  Surgeon: Carloyn Manner, MD;  Location: [place];  Service: ENT;  Laterality: Bilateral;   parathyroid gland one removed     PARATHYROIDECTOMY     SKIN CANCER EXCISION     BCCA    TOE SURGERY Left    joint replacement bit toe   TUBAL LIGATION     UMBILICAL HERNIA REPAIR     UPPER GI ENDOSCOPY     Family History  Problem Relation Age of Onset   Breast cancer Mother 86   Hypertension Mother    Stroke Mother        Late 64   Arthritis Mother    Colon polyps Mother    Cancer Mother        Breast   Hypertension Father    Hyperlipidemia Father    Dementia Father    COPD Father    Congestive Heart Failure Father    Diabetes Neg  Hx    Colon cancer Neg Hx    Esophageal cancer Neg Hx    Kidney disease Neg Hx    Gallbladder disease Neg Hx    Social History   Socioeconomic History   Marital status: Widowed    Spouse name: Not on file   Number of children: 2   Years of education: Not on file   Highest education level: Not on file  Occupational History   Occupation: Retired    Fish farm manager: [employer]  Tobacco Use   Smoking status: Former    Years: 7.00    Types: Cigarettes    Quit date: 05/09/1973    Years since quitting: 48.6   Smokeless tobacco: Never   Tobacco comments:    Quit at age 33  Vaping Use   Vaping Use: Never used  Substance and Sexual Activity   Alcohol use: No    Alcohol/week: 0.0 standard drinks of alcohol   Drug use: No   Sexual activity: Not Currently  Other Topics Concern   Not on file  Social History Narrative   Not on file   Social Determinants of Health   Financial Resource Strain: Low Risk  (10/27/2020)   Overall Financial Resource Strain (CARDIA)    Difficulty of Paying Living  Expenses: Not hard at all  Food Insecurity: No Food Insecurity (10/27/2020)   Hunger Vital Sign    Worried About Running Out of Food in the Last Year: Never true    [place] in the Last Year: Never true  Transportation Needs: No Transportation Needs (10/27/2020)   PRAPARE - Hydrologist (Medical): No    Lack of Transportation (Non-Medical): No  Physical Activity: Not on file  Stress: No Stress Concern Present (10/27/2020)   [place]    Feeling of Stress : Only a little  Social Connections: Unknown (10/27/2020)   Social Connection and Isolation Panel [NHANES]    Frequency of Communication with Friends and Family: Once a week    Frequency of Social Gatherings with Friends and Family: Not on file    Attends Religious Services: Not on file    Active Member of Clubs or Organizations: Not on file    Attends Archivist Meetings: Not on file    Marital Status: Not on file     Review of Systems  Constitutional:  Negative for appetite change and unexpected weight change.  HENT:  Negative for congestion and sinus pressure.   Respiratory:  Negative for cough, chest tightness and shortness of breath.   Cardiovascular:  Negative for chest pain, palpitations and leg swelling.  Gastrointestinal:  Negative for abdominal pain, diarrhea, nausea and vomiting.  Genitourinary:  Negative for difficulty urinating and dysuria.  Musculoskeletal:  Negative for myalgias.       Knee issues as outlined.   Skin:  Negative for color change and rash.  Neurological:  Negative for dizziness, light-headedness and headaches.  Psychiatric/Behavioral:  Positive for sleep disturbance. Negative for agitation and dysphoric mood.        Objective:     BP 120/68 (BP Location: Left Arm, Patient Position: Sitting, Cuff Size: Small)   Pulse 68   Temp 97.9 F (36.6 C) (Temporal)   Resp 17   Ht 5' 5"  (1.651 m)    Wt 139 lb 12.8 oz (63.4 kg)   LMP 05/09/2006 (Approximate)   SpO2 99%   BMI 23.26 kg/m  Wt Readings  from Last 3 Encounters:  12/16/21 139 lb (63 kg)  12/13/21 139 lb 12.8 oz (63.4 kg)  08/17/21 140 lb (63.5 kg)    Physical Exam Vitals reviewed.  Constitutional:      General: She is not in acute distress.    Appearance: Normal appearance.  HENT:     Head: Normocephalic and atraumatic.     Right Ear: External ear normal.     Left Ear: External ear normal.  Eyes:     General: No scleral icterus.       Right eye: No discharge.        Left eye: No discharge.     Conjunctiva/sclera: Conjunctivae normal.  Neck:     Thyroid: No thyromegaly.  Cardiovascular:     Rate and Rhythm: Normal rate and regular rhythm.  Pulmonary:     Effort: No respiratory distress.     Breath sounds: Normal breath sounds. No wheezing.  Abdominal:     General: Bowel sounds are normal.     Palpations: Abdomen is soft.     Tenderness: There is no abdominal tenderness.  Musculoskeletal:        General: No swelling or tenderness.     Cervical back: Neck supple. No tenderness.  Lymphadenopathy:     Cervical: No cervical adenopathy.  Skin:    Findings: No erythema or rash.  Neurological:     Mental Status: She is alert.  Psychiatric:        Mood and Affect: Mood normal.        Behavior: Behavior normal.      Outpatient Encounter Medications as of 12/13/2021  Medication Sig   acetaminophen (TYLENOL) 500 MG tablet Take 1,000 mg by mouth 2 (two) times daily as needed for moderate pain or headache.   amLODipine (NORVASC) 5 MG tablet TAKE ONE TABLET BY MOUTH EVERY DAY   Calcium Carb-Cholecalciferol (CALCIUM 600+D) 600-800 MG-UNIT TABS Take 1 tablet by mouth daily.   cyclobenzaprine (FLEXERIL) 5 MG tablet TAKE ONE TABLET AT BEDTIME AS NEEDED FORMUSCLE SPASM   dicyclomine (BENTYL) 10 MG capsule TAKE 1 CAPSULE BY MOUTH ONCE DAILY AS NEEDED AS DIRECTED BY PHYSICIAN (Patient taking differently: Take 10 mg by  mouth daily as needed for spasms.)   DOCOSAHEXAENOIC ACID-EPA PO Take by mouth daily.   famotidine (PEPCID) 20 MG tablet Take 20 mg by mouth every evening.   fluticasone (FLONASE) 50 MCG/ACT nasal spray Place into both nostrils daily.   folic acid (FOLVITE) 1 MG tablet Take 1 mg by mouth daily.   losartan (COZAAR) 100 MG tablet TAKE 1 TABLET BY MOUTH EVERY DAY   Melatonin 5 MG CAPS Take 5 mg by mouth at bedtime.   methotrexate (RHEUMATREX) 2.5 MG tablet Take 15 mg by mouth every Friday.   Multiple Vitamin (MULTI-VITAMINS) TABS Take 1 tablet by mouth daily.    Omega-3 Fatty Acids (FISH OIL) 1200 MG CAPS Take 1,200 mg by mouth daily.   RABEprazole (ACIPHEX) 20 MG tablet Take 20 mg by mouth daily.   rosuvastatin (CRESTOR) 5 MG tablet TAKE ONE TABLET 3 TIMES A WEEK   Turmeric Curcumin 500 MG CAPS Take 500 mg by mouth daily.   No facility-administered encounter medications on file as of 12/13/2021.     Lab Results  Component Value Date   WBC 6.4 07/27/2021   HGB 12.5 07/27/2021   HCT 37.3 07/27/2021   PLT 265 07/27/2021   GLUCOSE 85 10/07/2021   CHOL 156 10/07/2021   TRIG 120.0  10/07/2021   HDL 60.00 10/07/2021   LDLDIRECT 114.0 12/17/2018   LDLCALC 72 10/07/2021   ALT 34 12/08/2021   AST 24 12/08/2021   NA 144 10/07/2021   K 3.8 10/07/2021   CL 106 10/07/2021   CREATININE 0.81 10/07/2021   BUN 14 10/07/2021   CO2 28 10/07/2021   TSH 0.73 10/07/2021   HGBA1C 5.6 12/31/2014    US Abdomen Complete  Result Date: 11/12/2021 CLINICAL DATA:  Abnormal LFTs EXAM: ABDOMEN ULTRASOUND COMPLETE COMPARISON:  None Available. FINDINGS: Gallbladder: No gallstones or wall thickening visualized. No sonographic Murphy sign noted by sonographer. Common bile duct: Diameter: 0.5 cm, within normal limits Liver: No focal lesion identified. Within normal limits in parenchymal echogenicity. Portal vein is patent on color Doppler imaging with normal direction of blood flow towards the liver. IVC: No  abnormality visualized. Pancreas: Visualized portion unremarkable. Spleen: Size and appearance within normal limits. Right Kidney: Length: 10.5 cm. Echogenicity within normal limits. No hydronephrosis. Multiple small cysts a representative cyst in the midpole measures 1.8 cm. Left Kidney: Length: 10.6 cm. Echogenicity within normal limits. No hydronephrosis. Multiple small cysts. A representative parapelvic cyst measures 2.4 cm Abdominal aorta: No aneurysm visualized. Other findings: None. IMPRESSION: 1.  Unremarkable sonographic appearance of the liver. 2.  Small benign cysts in the bilateral kidneys. Electronically Signed   By: Audie Pinto M.D.   On: 11/12/2021 12:55       Assessment & Plan:   Problem List Items Addressed This Visit     Carotid artery disease ([place])    Evaluated by [facility].  Stable.  Recommended f/u prn.       Fibromuscular dysplasia ([place])    Evaluated by [facility].  Stable.  Recommended f/u prn       GERD (gastroesophageal reflux disease)    Continues on aciphex.  Appears to be stable.       Hyperglycemia    Low carb diet and exercise.  Follow met b and a1c       Relevant Orders   Hemoglobin A1c   Hyperlipidemia    Continue crestor.  Low cholesterol diet and exercise.  Follow lipid panel.       Relevant Orders   CBC with Differential/Platelet   Hepatic function panel   Lipid panel   Hypertension    Continue losartan and amlodipine.  Follow pressures.  Follow metabolic panel.       Relevant Orders   Basic metabolic panel   Primary hyperparathyroidism ([place])    Worked up by endocrinology.  Follow calcium level.       Renal artery stenosis (HCC)    Has been evaluated by [facility].  Follow renal function.       Rheumatoid arthritis ([place])    Followed by rheumatology.  On MTX.  Appears to be stable.       Sleep difficulties    Describes persistent issue as outlined.  Tried melatonin.  Discussed possible sleep apnea.  Notify me if desires to pursue further  testing.  Follow         Einar Pheasant, MD

## 2021-12-14 DIAGNOSIS — M159 Polyosteoarthritis, unspecified: Secondary | ICD-10-CM | POA: Diagnosis not present

## 2021-12-14 DIAGNOSIS — M0600 Rheumatoid arthritis without rheumatoid factor, unspecified site: Secondary | ICD-10-CM | POA: Diagnosis not present

## 2021-12-14 DIAGNOSIS — Z79899 Other long term (current) drug therapy: Secondary | ICD-10-CM | POA: Diagnosis not present

## 2021-12-16 ENCOUNTER — Ambulatory Visit (INDEPENDENT_AMBULATORY_CARE_PROVIDER_SITE_OTHER): Payer: Medicare Other | Admitting: Obstetrics and Gynecology

## 2021-12-16 ENCOUNTER — Encounter: Payer: Self-pay | Admitting: Obstetrics and Gynecology

## 2021-12-16 VITALS — BP 128/62 | HR 72 | Ht 65.75 in | Wt 139.0 lb

## 2021-12-16 DIAGNOSIS — Z01419 Encounter for gynecological examination (general) (routine) without abnormal findings: Secondary | ICD-10-CM | POA: Diagnosis not present

## 2021-12-16 DIAGNOSIS — Z8739 Personal history of other diseases of the musculoskeletal system and connective tissue: Secondary | ICD-10-CM

## 2021-12-16 NOTE — Patient Instructions (Signed)

## 2021-12-17 ENCOUNTER — Encounter: Payer: Self-pay | Admitting: Obstetrics and Gynecology

## 2021-12-19 ENCOUNTER — Encounter: Payer: Self-pay | Admitting: Internal Medicine

## 2021-12-19 NOTE — Assessment & Plan Note (Signed)
Low carb diet and exercise.  Follow met b and a1c.   

## 2021-12-19 NOTE — Assessment & Plan Note (Signed)
Describes persistent issue as outlined.  Tried melatonin.  Discussed possible sleep apnea.  Notify me if desires to pursue further testing.  Follow

## 2021-12-19 NOTE — Assessment & Plan Note (Signed)
Evaluated by [facility].  Stable.  Recommended f/u prn.  

## 2021-12-19 NOTE — Assessment & Plan Note (Signed)
Has been evaluated by [facility].  Follow renal function.  

## 2021-12-19 NOTE — Assessment & Plan Note (Signed)
Continues on aciphex.  Appears to be stable.  

## 2021-12-19 NOTE — Assessment & Plan Note (Signed)
Worked up by endocrinology.  Follow calcium level.  

## 2021-12-19 NOTE — Assessment & Plan Note (Signed)
Continue crestor.  Low cholesterol diet and exercise.  Follow lipid panel.  

## 2021-12-19 NOTE — Assessment & Plan Note (Signed)
Followed by rheumatology.  On MTX.  Appears to be stable.  

## 2021-12-19 NOTE — Assessment & Plan Note (Signed)
Continue losartan and amlodipine.  Follow pressures.  Follow metabolic panel.  

## 2021-12-29 DIAGNOSIS — M25561 Pain in right knee: Secondary | ICD-10-CM | POA: Diagnosis not present

## 2021-12-30 ENCOUNTER — Ambulatory Visit (INDEPENDENT_AMBULATORY_CARE_PROVIDER_SITE_OTHER): Payer: Medicare Other

## 2021-12-30 VITALS — Ht 65.75 in | Wt 139.0 lb

## 2021-12-30 DIAGNOSIS — Z0000 Encounter for general adult medical examination without abnormal findings: Secondary | ICD-10-CM | POA: Diagnosis not present

## 2021-12-30 NOTE — Progress Notes (Signed)
Subjective:   Victoria Moss is a 71 y.o. female who presents for Medicare Annual (Subsequent) preventive examination.  Review of Systems    No ROS.  Medicare Wellness Virtual Visit.  Visual/audio telehealth visit, UTA vital signs.   See social history for additional risk factors.   Cardiac Risk Factors include: advanced age (>7mn, >>110women);hypertension     Objective:    Today's Vitals   12/30/21 1023  Weight: 139 lb (63 kg)  Height: 5' 5.75" (1.67 m)   Body mass index is 22.61 kg/m.     12/30/2021   10:16 AM 08/17/2021   10:25 AM 07/30/2021    6:26 AM 07/22/2021    8:34 AM 10/27/2020   10:59 AM 10/25/2019   10:42 AM 10/20/2016    4:30 PM  Advanced Directives  Does Patient Have a Medical Advance Directive? Yes Yes Yes Yes Yes No No  Type of AParamedicof A[place]Living will [place]Union [place]Living will HMelvin VillageLiving will Healthcare Power of [place]outh Ap[place]Living will    Does patient want to make changes to medical advance directive? No - Patient declined  No - Patient declined No - Patient declined No - Patient declined    Copy of HLancasterin Chart? No - copy requested No - copy requested No - copy requested No - copy requested No - copy requested    Would patient like information on creating a medical advance directive?      No - Patient declined Yes (MAU/Ambulatory/Procedural Areas - Information given)    Current Medications (verified) Outpatient Encounter Medications as of 12/30/2021  Medication Sig   acetaminophen (TYLENOL) 500 MG tablet Take 1,000 mg by mouth 2 (two) times daily as needed for moderate pain or headache.   amLODipine (NORVASC) 5 MG tablet TAKE ONE TABLET BY MOUTH EVERY DAY   Calcium Carb-Cholecalciferol (CALCIUM 600+D) 600-800 MG-UNIT TABS Take 1 tablet by mouth daily.   cyclobenzaprine (FLEXERIL) 5 MG tablet TAKE ONE TABLET AT BEDTIME AS  NEEDED FORMUSCLE SPASM   dicyclomine (BENTYL) 10 MG capsule TAKE 1 CAPSULE BY MOUTH ONCE DAILY AS NEEDED AS DIRECTED BY PHYSICIAN (Patient taking differently: Take 10 mg by mouth daily as needed for spasms.)   DOCOSAHEXAENOIC ACID-EPA PO Take by mouth daily.   famotidine (PEPCID) 20 MG tablet Take 20 mg by mouth every evening.   fluticasone (FLONASE) 50 MCG/ACT nasal spray Place into both nostrils daily.   folic acid (FOLVITE) 1 MG tablet Take 1 mg by mouth daily.   losartan (COZAAR) 100 MG tablet TAKE 1 TABLET BY MOUTH EVERY DAY   Melatonin 5 MG CAPS Take 5 mg by mouth at bedtime.   methotrexate (RHEUMATREX) 2.5 MG tablet Take 15 mg by mouth every Friday.   Multiple Vitamin (MULTI-VITAMINS) TABS Take 1 tablet by mouth daily.    Omega-3 Fatty Acids (FISH OIL) 1200 MG CAPS Take 1,200 mg by mouth daily.   RABEprazole (ACIPHEX) 20 MG tablet Take 20 mg by mouth daily.   rosuvastatin (CRESTOR) 5 MG tablet TAKE ONE TABLET 3 TIMES A WEEK   Turmeric Curcumin 500 MG CAPS Take 500 mg by mouth daily.   No facility-administered encounter medications on file as of 12/30/2021.    Allergies (verified) Codeine, Milk (cow), and Penicillins   History: Past Medical History:  Diagnosis Date   Allergy    Arthritis    all over and right wrist   Basal cell cancer  COVID-19 10/2020   Diverticulosis    H/O   GERD (gastroesophageal reflux disease)    Heart murmur    Hypertension    IBS (irritable bowel syndrome)    Migraine    H/O, none since stopping chocolate   Osteoporosis    PONV (postoperative nausea and vomiting)    Renal artery stenosis (HCC)    Rheumatic fever    H/O   Shingles 08/2020   Status post dilation of esophageal narrowing    Past Surgical History:  Procedure Laterality Date   APPENDECTOMY  1982   BREAST BIOPSY Right 09/2014   axillary node- neg   CATARACT EXTRACTION, BILATERAL     CESAREAN SECTION     COLONOSCOPY  2018   COLONOSCOPY     COLONOSCOPY WITH PROPOFOL N/A  08/17/2021   Procedure: COLONOSCOPY WITH PROPOFOL;  Surgeon: Lesly Rubenstein, MD;  Location: [facility] ENDOSCOPY;  Service: Endoscopy;  Laterality: N/A;   DILATION AND CURETTAGE OF UTERUS     x2   DILATION AND CURETTAGE, DIAGNOSTIC / THERAPEUTIC     x 2   fatty tumor     on back x2   HERNIA REPAIR     KNEE SURGERY Right    MASS EXCISION Left 07/30/2021   Procedure: EXCISION MASS, soft tissue left lumbar back;  Surgeon: Ronny Bacon, MD;  Location: [facility] ORS;  Service: General;  Laterality: Left;   MYRINGOTOMY WITH TUBE PLACEMENT Bilateral 03/24/2021   Procedure: MYRINGOTOMY WITH TUBE PLACEMENT;  Surgeon: Carloyn Manner, MD;  Location: [place];  Service: ENT;  Laterality: Bilateral;   parathyroid gland one removed     PARATHYROIDECTOMY     SKIN CANCER EXCISION     BCCA    TOE SURGERY Left    joint replacement bit toe   TUBAL LIGATION     UMBILICAL HERNIA REPAIR     UPPER GI ENDOSCOPY     Family History  Problem Relation Age of Onset   Breast cancer Mother 2   Hypertension Mother    Stroke Mother        Late 42   Arthritis Mother    Colon polyps Mother    Cancer Mother        Breast   Hypertension Father    Hyperlipidemia Father    Dementia Father    COPD Father    Congestive Heart Failure Father    Diabetes Neg Hx    Colon cancer Neg Hx    Esophageal cancer Neg Hx    Kidney disease Neg Hx    Gallbladder disease Neg Hx    Social History   Socioeconomic History   Marital status: Widowed    Spouse name: Not on file   Number of children: 2   Years of education: Not on file   Highest education level: Not on file  Occupational History   Occupation: Retired    Fish farm manager: OTHER  Tobacco Use   Smoking status: Former    Years: 7.00    Types: Cigarettes    Quit date: 05/09/1973    Years since quitting: 48.6   Smokeless tobacco: Never   Tobacco comments:    Quit at age 75  Vaping Use   Vaping Use: Never used  Substance and Sexual Activity   Alcohol  use: No    Alcohol/week: 0.0 standard drinks of alcohol   Drug use: No   Sexual activity: Not Currently  Other Topics Concern   Not on file  Social History  Narrative   Not on file   Social Determinants of Health   Financial Resource Strain: Low Risk  (12/30/2021)   Overall Financial Resource Strain (CARDIA)    Difficulty of Paying Living Expenses: Not hard at all  Food Insecurity: No Food Insecurity (12/30/2021)   Hunger Vital Sign    Worried About Running Out of Food in the Last Year: Never true    Ran Out of Food in the Last Year: Never true  Transportation Needs: No Transportation Needs (12/30/2021)   PRAPARE - Hydrologist (Medical): No    Lack of Transportation (Non-Medical): No  Physical Activity: Sufficiently Active (12/30/2021)   Exercise Vital Sign    Days of Exercise per Week: 5 days    Minutes of Exercise per Session: 60 min  Stress: No Stress Concern Present (12/30/2021)   [place]    Feeling of Stress : Only a little  Social Connections: Moderately Isolated (12/30/2021)   Social Connection and Isolation Panel [NHANES]    Frequency of Communication with Friends and Family: Once a week    Frequency of Social Gatherings with Friends and Family: Three times a week    Attends Religious Services: Never    Active Member of Clubs or Organizations: Yes    Attends Archivist Meetings: Never    Marital Status: Widowed    Tobacco Counseling Counseling given: Not Answered Tobacco comments: Quit at age 26   Clinical Intake:  Pre-visit preparation completed: Yes        Diabetes: No  How often do you need to have someone help you when you read instructions, pamphlets, or other written materials from your doctor or pharmacy?: 1 - Never    Interpreter Needed?: No      Activities of Daily Living    12/30/2021   10:04 AM 07/22/2021    8:37 AM  In your present  state of health, do you have any difficulty performing the following activities:  Hearing? 0   Vision? 0   Difficulty concentrating or making decisions? 0   Walking or climbing stairs? 0   Dressing or bathing? 0   Doing errands, shopping? 0 0  Preparing Food and eating ? N   Using the Toilet? N   In the past six months, have you accidently leaked urine? N   Do you have problems with loss of bowel control? N   Managing your Medications? N   Managing your Finances? N   Housekeeping or managing your Housekeeping? N     Patient Care Team: Einar Pheasant, MD as PCP - General (Internal Medicine) Einar Pheasant, MD (Internal Medicine) Bary Castilla Forest Gleason, MD (General Surgery) Salvadore Dom, MD as Consulting Physician (Obstetrics and Gynecology)  Indicate any recent Medical Services you may have received from other than [facility] providers in the past year (date may be approximate).     Assessment:   This is a routine wellness examination for Victoria Moss.  Virtual Visit via Telephone Note  I connected with  Victoria Moss on 12/30/21 at 10:00 AM EDT by telephone and verified that I am speaking with the correct person using two identifiers.  Location: Patient: home Provider: office Persons participating in the virtual visit: patient/Nurse Health Advisor   I discussed the limitations of performing an evaluation and management service by telehealth. We continued and completed visit with audio only. Some vital signs may be absent or patient reported.  Hearing/Vision screen Hearing Screening - Comments:: Patient is able to hear conversational tones without difficulty. No issues reported.  Vision Screening - Comments:: Followed by [facility] Wears reader lenses Cataract extraction, bilateral They have seen their ophthalmologist in the last 12 months.   Dietary issues and exercise activities discussed: Current Exercise Habits: Home exercise routine, Time (Minutes): 60,  Frequency (Times/Week): 5, Weekly Exercise (Minutes/Week): 300, Intensity: Mild Healthy diet Good water intake   Goals Addressed               This Visit's Progress     Patient Stated     Increase physical activity (pt-stated)        10-12,000 steps daily Strength training exercises        Depression Screen    12/30/2021   10:10 AM 12/13/2021    1:24 PM 10/27/2020   10:53 AM 10/25/2019   10:45 AM 11/02/2018   10:19 AM 12/06/2016   12:12 PM 10/20/2016    3:50 PM  PHQ 2/9 Scores  PHQ - 2 Score 0 0 0 0 0 3 4  PHQ- 9 Score     0 5 10    Fall Risk    12/30/2021   10:04 AM 12/13/2021    1:24 PM 07/08/2021    1:56 PM 10/27/2020   11:00 AM 08/04/2020    9:38 AM  Fall Risk   Falls in the past year? 0 0 0 0 0  Number falls in past yr: 0 0  0   Injury with Fall?  0  0   Risk for fall due to :  No Fall Risks     Follow up Falls evaluation completed Falls evaluation completed  Falls evaluation completed    ASSISTIVE DEVICES UTILIZED TO PREVENT FALLS: Life alert? No  Use of a cane, walker or w/c? No  Grab bars in the bathroom? No  Shower chair or bench in shower? No  Chair comfort height toilet? Yes   TIMED UP AND GO: Was the test performed? No .   Cognitive Function:    10/20/2016    4:30 PM  MMSE - Mini Mental State Exam  Orientation to time 5  Orientation to Place 5  Registration 3  Attention/ Calculation 5  Recall 3  Language- name 2 objects 2  Language- repeat 1  Language- follow 3 step command 3  Language- read & follow direction 1  Write a sentence 1  Copy design 1  Total score 30        12/30/2021   10:19 AM 10/25/2019   10:58 AM  6CIT Screen  What Year? 0 points   What month? 0 points   What time? 0 points   Count back from 20 0 points   Months in reverse 0 points 0 points  Repeat phrase 0 points   Total Score 0 points     Immunizations Immunization History  Administered Date(s) Administered   Fluad Quad(high Dose 65+) 01/04/2019, 01/17/2020,  01/12/2021   Influenza, High Dose Seasonal PF 02/10/2016, 01/26/2017, 02/06/2018   Influenza,inj,Quad PF,6+ Mos 02/04/2013, 02/01/2014, 02/06/2015   Influenza,inj,quad, With Preservative 08/07/2016   Moderna Covid-19 Vaccine Bivalent Booster 92yr & up 01/29/2021   Moderna Sars-Covid-2 Vaccination 06/05/2019, 07/03/2019, 03/01/2020   Pneumococcal Conjugate-13 08/14/2015   Pneumococcal Polysaccharide-23 10/20/2016   Tdap 08/19/2014   Zoster Recombinat (Shingrix) 12/28/2017, 04/12/2018   Zoster, Live 05/11/2011   Screening Tests Health Maintenance  Topic Date Due   INFLUENZA VACCINE  01/07/2022 (  Originally 12/07/2021)   COVID-19 Vaccine (5 - Moderna risk series) 01/15/2022 (Originally 03/26/2021)   MAMMOGRAM  10/19/2022   TETANUS/TDAP  08/18/2024   COLONOSCOPY (Pts 45-8yr Insurance coverage will need to be confirmed)  08/18/2031   Pneumonia Vaccine 71 Years old  Completed   DEXA SCAN  Completed   Hepatitis C Screening  Completed   Zoster Vaccines- Shingrix  Completed   HPV VACCINES  Aged Out   Health Maintenance There are no preventive care reminders to display for this patient.  Lung Cancer Screening: (Low Dose CT Chest recommended if Age 71-80years, 30 pack-year currently smoking OR have quit w/in 15years.) does not qualify.   Vision Screening: Recommended annual ophthalmology exams for early detection of glaucoma and other disorders of the eye.  Dental Screening: Recommended annual dental exams for proper oral hygiene.  Community Resource Referral / Chronic Care Management: CRR required this visit?  No   CCM required this visit?  No      Plan:     I have personally reviewed and noted the following in the patient's chart:   Medical and social history Use of alcohol, tobacco or illicit drugs  Current medications and supplements including opioid prescriptions. Patient is not currently taking opioid prescriptions. Functional ability and status Nutritional  status Physical activity Advanced directives List of other physicians Hospitalizations, surgeries, and ER visits in previous 12 months Vitals Screenings to include cognitive, depression, and falls Referrals and appointments  In addition, I have reviewed and discussed with patient certain preventive protocols, quality metrics, and best practice recommendations. A written personalized care plan for preventive services as well as general preventive health recommendations were provided to patient.     OVarney Biles LPN   [id number]

## 2021-12-30 NOTE — Patient Instructions (Addendum)
  Ms. Auker , Thank you for taking time to come for your Medicare Wellness Visit. I appreciate your ongoing commitment to your health goals. Please review the following plan we discussed and let me know if I can assist you in the future.   These are the goals we discussed:  Goals       Patient Stated     Increase physical activity (pt-stated)      10-12,000 steps daily Strength training exercises         This is a list of the screening recommended for you and due dates:  Health Maintenance  Topic Date Due   Flu Shot  01/07/2022*   COVID-19 Vaccine (5 - Moderna risk series) 01/15/2022*   Mammogram  10/19/2022   Tetanus Vaccine  08/18/2024   Colon Cancer Screening  08/18/2031   Pneumonia Vaccine  Completed   DEXA scan (bone density measurement)  Completed   Hepatitis C Screening: USPSTF Recommendation to screen - Ages 74-79 yo.  Completed   Zoster (Shingles) Vaccine  Completed   HPV Vaccine  Aged Out  *Topic was postponed. The date shown is not the original due date.

## 2022-01-05 ENCOUNTER — Ambulatory Visit (INDEPENDENT_AMBULATORY_CARE_PROVIDER_SITE_OTHER): Payer: Medicare Other | Admitting: Family

## 2022-01-05 ENCOUNTER — Encounter: Payer: Self-pay | Admitting: Family

## 2022-01-05 VITALS — BP 128/78 | HR 67 | Temp 98.1°F | Ht 66.0 in | Wt 138.8 lb

## 2022-01-05 DIAGNOSIS — I701 Atherosclerosis of renal artery: Secondary | ICD-10-CM

## 2022-01-05 DIAGNOSIS — J329 Chronic sinusitis, unspecified: Secondary | ICD-10-CM | POA: Diagnosis not present

## 2022-01-05 MED ORDER — CEPHALEXIN 500 MG PO CAPS: 500.0000 mg | ORAL_CAPSULE | Freq: Two times a day (BID) | ORAL | 0 refills | Status: DC

## 2022-01-05 NOTE — Progress Notes (Signed)
Right side hurts drainage in back of throat for about 3 weeks. Has taken 2 Covid tests. Both were negative

## 2022-01-05 NOTE — Progress Notes (Signed)
Subjective:    Patient ID: Victoria Moss, female    DOB: 06-07-1950, 71 y.o.   MRN: [id number]  CC: Victoria Moss is a 71 y.o. female who presents today for an acute visit.    HPI: Bilateral sinus pain x 3 weeks Feels more pressure on right side of sinus. Pain radiated towards right ear.   Right side drainage, and feels lymph nodes enlarged on right side.  Negative COVID test at home  Tried tylenol , nasal saline spray  No fever, HA, vision changes, rash, cough, dental pain.  She does grind her teeth at night She has previously tolerated Keflex.  No shortness of breath, throat  ortongue swelling with penicillin.  Skin itching was reported over 40 years ago and she is unsure if she is actually allergic to penicillin  H/o fibromuscular dysplasia , hypertension, GERD, osteopenia HISTORY:  Past Medical History:  Diagnosis Date  . Allergy   . Arthritis    all over and right wrist  . Basal cell cancer   . COVID-19 10/2020  . Diverticulosis    H/O  . GERD (gastroesophageal reflux disease)   . Heart murmur   . Hypertension   . IBS (irritable bowel syndrome)   . Migraine    H/O, none since stopping chocolate  . Osteoporosis   . PONV (postoperative nausea and vomiting)   . Renal artery stenosis ([place])   . Rheumatic fever    H/O  . Shingles 08/2020  . Status post dilation of esophageal narrowing    Past Surgical History:  Procedure Laterality Date  . APPENDECTOMY  1982  . BREAST BIOPSY Right 09/2014   axillary node- neg  . CATARACT EXTRACTION, BILATERAL    . CESAREAN SECTION    . COLONOSCOPY  2018  . COLONOSCOPY    . COLONOSCOPY WITH PROPOFOL N/A 08/17/2021   Procedure: COLONOSCOPY WITH PROPOFOL;  Surgeon: Lesly Rubenstein, MD;  Location: [facility] ENDOSCOPY;  Service: Endoscopy;  Laterality: N/A;  . DILATION AND CURETTAGE OF UTERUS     x2  . DILATION AND CURETTAGE, DIAGNOSTIC / THERAPEUTIC     x 2  . fatty tumor     on back x2  . HERNIA REPAIR    .  KNEE SURGERY Right   . MASS EXCISION Left 07/30/2021   Procedure: EXCISION MASS, soft tissue left lumbar back;  Surgeon: Ronny Bacon, MD;  Location: [facility] ORS;  Service: General;  Laterality: Left;  . MYRINGOTOMY WITH TUBE PLACEMENT Bilateral 03/24/2021   Procedure: MYRINGOTOMY WITH TUBE PLACEMENT;  Surgeon: Carloyn Manner, MD;  Location: [place];  Service: ENT;  Laterality: Bilateral;  . parathyroid gland one removed    . PARATHYROIDECTOMY    . SKIN CANCER EXCISION     BCCA   . TOE SURGERY Left    joint replacement bit toe  . TUBAL LIGATION    . UMBILICAL HERNIA REPAIR    . UPPER GI ENDOSCOPY     Family History  Problem Relation Age of Onset  . Breast cancer Mother 53  . Hypertension Mother   . Stroke Mother        Late 43  . Arthritis Mother   . Colon polyps Mother   . Cancer Mother        Breast  . Hypertension Father   . Hyperlipidemia Father   . Dementia Father   . COPD Father   . Congestive Heart Failure Father   . Diabetes Neg Hx   .  Colon cancer Neg Hx   . Esophageal cancer Neg Hx   . Kidney disease Neg Hx   . Gallbladder disease Neg Hx     Allergies: Codeine, Milk (cow), and Penicillins Current Outpatient Medications on File Prior to Visit  Medication Sig Dispense Refill  . acetaminophen (TYLENOL) 500 MG tablet Take 1,000 mg by mouth 2 (two) times daily as needed for moderate pain or headache.    Marland Kitchen amLODipine (NORVASC) 5 MG tablet TAKE ONE TABLET BY MOUTH EVERY DAY 30 tablet 5  . Calcium Carb-Cholecalciferol (CALCIUM 600+D) 600-800 MG-UNIT TABS Take 1 tablet by mouth daily.    . cyclobenzaprine (FLEXERIL) 5 MG tablet TAKE ONE TABLET AT BEDTIME AS NEEDED FORMUSCLE SPASM 30 tablet 0  . dicyclomine (BENTYL) 10 MG capsule TAKE 1 CAPSULE BY MOUTH ONCE DAILY AS NEEDED AS DIRECTED BY PHYSICIAN (Patient taking differently: Take 10 mg by mouth daily as needed for spasms.) 90 capsule 1  . DOCOSAHEXAENOIC ACID-EPA PO Take by mouth daily.    . famotidine  (PEPCID) 20 MG tablet Take 20 mg by mouth every evening.    . fluticasone (FLONASE) 50 MCG/ACT nasal spray Place into both nostrils daily.    . folic acid (FOLVITE) 1 MG tablet Take 1 mg by mouth daily.    Marland Kitchen losartan (COZAAR) 100 MG tablet TAKE 1 TABLET BY MOUTH EVERY DAY 30 tablet 3  . Melatonin 5 MG CAPS Take 5 mg by mouth at bedtime.    . methotrexate (RHEUMATREX) 2.5 MG tablet Take 15 mg by mouth every Friday.    . Multiple Vitamin (MULTI-VITAMINS) TABS Take 1 tablet by mouth daily.     . Omega-3 Fatty Acids (FISH OIL) 1200 MG CAPS Take 1,200 mg by mouth daily.    . RABEprazole (ACIPHEX) 20 MG tablet Take 20 mg by mouth daily.    . rosuvastatin (CRESTOR) 5 MG tablet TAKE ONE TABLET 3 TIMES A WEEK 15 tablet 5  . Turmeric Curcumin 500 MG CAPS Take 500 mg by mouth daily.     No current facility-administered medications on file prior to visit.    Social History   Tobacco Use  . Smoking status: Former    Years: 7.00    Types: Cigarettes    Quit date: 05/09/1973    Years since quitting: 48.6  . Smokeless tobacco: Never  . Tobacco comments:    Quit at age 32  Vaping Use  . Vaping Use: Never used  Substance Use Topics  . Alcohol use: No    Alcohol/week: 0.0 standard drinks of alcohol  . Drug use: No    Review of Systems  Constitutional:  Negative for chills and fever.  HENT:  Positive for congestion, ear pain and sore throat. Negative for dental problem.   Respiratory:  Negative for cough.   Cardiovascular:  Negative for chest pain and palpitations.  Gastrointestinal:  Negative for nausea and vomiting.      Objective:    BP 128/78 (BP Location: Left Arm, Patient Position: Sitting, Cuff Size: Normal)   Pulse 67   Temp 98.1 F (36.7 C) (Oral)   Ht '5\' 6"'$  (1.676 m)   Wt 138 lb 12.8 oz (63 kg)   LMP 05/09/2006 (Approximate)   SpO2 99%   BMI 22.40 kg/m    Physical Exam Vitals reviewed.  Constitutional:      Appearance: She is well-developed.  HENT:     Head:  Normocephalic and atraumatic.     Right Ear: Hearing, tympanic membrane,  ear canal and external ear normal. No decreased hearing noted. No drainage, swelling or tenderness. No middle ear effusion. No foreign body. Tympanic membrane is not erythematous or bulging.     Left Ear: Hearing, tympanic membrane, ear canal and external ear normal. No decreased hearing noted. No drainage, swelling or tenderness.  No middle ear effusion. No foreign body. Tympanic membrane is not erythematous or bulging.     Nose: Nose normal. No rhinorrhea.     Right Sinus: No maxillary sinus tenderness or frontal sinus tenderness.     Left Sinus: No maxillary sinus tenderness or frontal sinus tenderness.     Mouth/Throat:     Pharynx: Uvula midline. No oropharyngeal exudate or posterior oropharyngeal erythema.     Tonsils: No tonsillar abscesses.  Eyes:     Conjunctiva/sclera: Conjunctivae normal.  Cardiovascular:     Rate and Rhythm: Regular rhythm.     Pulses: Normal pulses.     Heart sounds: Normal heart sounds.  Pulmonary:     Effort: Pulmonary effort is normal.     Breath sounds: Normal breath sounds. No wheezing, rhonchi or rales.  Lymphadenopathy:     Head:     Right side of head: No submental, submandibular, tonsillar, preauricular, posterior auricular or occipital adenopathy.     Left side of head: No submental, submandibular, tonsillar, preauricular, posterior auricular or occipital adenopathy.     Cervical: No cervical adenopathy.  Skin:    General: Skin is warm and dry.  Neurological:     Mental Status: She is alert.  Psychiatric:        Speech: Speech normal.        Behavior: Behavior normal.        Thought Content: Thought content normal.       Assessment & Plan:   Problem List Items Addressed This Visit       Respiratory   Sinusitis - Primary    Afebrile.  Based on duration of symptoms concern for bacterial sinusitis.  I have advised patient to monitor for any teeth symptoms and  certainly call her dentist if she has any dental pain.  We have opted to start Keflex.  Patient has vague history of penicillin allergy and have advised her of allergy to cephalosporins in patients that are PCN allergic.  She tolerated Keflex in the past.  Advised probiotics.  She will let me how she is doing      Relevant Medications   cephALEXin (KEFLEX) 500 MG capsule      I am having Gardiner Rhyme. [place] start on cephALEXin. I am also having her maintain her Multi-Vitamins, Turmeric Curcumin, methotrexate, dicyclomine, folic acid, RABEprazole, famotidine, Calcium 600+D, Fish Oil, acetaminophen, Melatonin, cyclobenzaprine, DOCOSAHEXAENOIC ACID-EPA PO, fluticasone, amLODipine, losartan, and rosuvastatin.   Meds ordered this encounter  Medications  . cephALEXin (KEFLEX) 500 MG capsule    Sig: Take 1 capsule (500 mg total) by mouth 2 (two) times daily.    Dispense:  20 capsule    Refill:  0    Order Specific Question:   Supervising Provider    Answer:   Crecencio Mc [[id number]]    Return precautions given.   Risks, benefits, and alternatives of the medications and treatment plan prescribed today were discussed, and patient expressed understanding.   Education regarding symptom management and diagnosis given to patient on AVS.  Continue to follow with Einar Pheasant, MD for routine health maintenance.   [name] and I agreed with plan.  Mable Paris, FNP

## 2022-01-05 NOTE — Assessment & Plan Note (Addendum)
Afebrile.  Based on duration of symptoms concern for bacterial sinusitis.  I have advised patient to monitor for any teeth symptoms and certainly call her dentist if she has any dental pain.  We have opted to start Keflex.  Patient has vague history of penicillin allergy and have advised her of allergy to cephalosporins in patients that are PCN allergic.  She tolerated Keflex in the past.  Advised probiotics.  She will let me how she is doing

## 2022-01-05 NOTE — Patient Instructions (Signed)
Start keflex  Ensure to take probiotics while on antibiotics and also for 2 weeks after completion. This can either be by eating yogurt daily or taking a probiotic supplement over the counter such as Culturelle.It is important to re-colonize the gut with good bacteria and also to prevent any diarrheal infections associated with antibiotic use.   Nice to see you!

## 2022-01-11 ENCOUNTER — Encounter: Payer: Self-pay | Admitting: Family

## 2022-01-11 ENCOUNTER — Other Ambulatory Visit: Payer: Self-pay | Admitting: Family

## 2022-01-11 DIAGNOSIS — J320 Chronic maxillary sinusitis: Secondary | ICD-10-CM

## 2022-01-11 MED ORDER — AZITHROMYCIN 250 MG PO TABS: ORAL_TABLET | ORAL | 0 refills | Status: AC

## 2022-01-20 DIAGNOSIS — M25561 Pain in right knee: Secondary | ICD-10-CM | POA: Diagnosis not present

## 2022-01-24 ENCOUNTER — Ambulatory Visit (INDEPENDENT_AMBULATORY_CARE_PROVIDER_SITE_OTHER): Payer: Medicare Other

## 2022-01-24 DIAGNOSIS — Z23 Encounter for immunization: Secondary | ICD-10-CM

## 2022-01-26 ENCOUNTER — Other Ambulatory Visit: Payer: Self-pay | Admitting: Internal Medicine

## 2022-01-27 ENCOUNTER — Other Ambulatory Visit: Payer: Self-pay | Admitting: Internal Medicine

## 2022-01-30 ENCOUNTER — Encounter: Payer: Self-pay | Admitting: Internal Medicine

## 2022-02-08 ENCOUNTER — Encounter: Payer: Self-pay | Admitting: Internal Medicine

## 2022-02-08 NOTE — Telephone Encounter (Signed)
Yes.  Labs are already in the system.  Just needs fasting lab appt

## 2022-03-07 ENCOUNTER — Encounter (INDEPENDENT_AMBULATORY_CARE_PROVIDER_SITE_OTHER): Payer: Self-pay

## 2022-03-14 DIAGNOSIS — H6983 Other specified disorders of Eustachian tube, bilateral: Secondary | ICD-10-CM | POA: Diagnosis not present

## 2022-03-14 DIAGNOSIS — H6121 Impacted cerumen, right ear: Secondary | ICD-10-CM | POA: Diagnosis not present

## 2022-03-18 ENCOUNTER — Other Ambulatory Visit (INDEPENDENT_AMBULATORY_CARE_PROVIDER_SITE_OTHER): Payer: Medicare Other

## 2022-03-18 DIAGNOSIS — E782 Mixed hyperlipidemia: Secondary | ICD-10-CM | POA: Diagnosis not present

## 2022-03-18 DIAGNOSIS — R739 Hyperglycemia, unspecified: Secondary | ICD-10-CM | POA: Diagnosis not present

## 2022-03-18 DIAGNOSIS — I10 Essential (primary) hypertension: Secondary | ICD-10-CM | POA: Diagnosis not present

## 2022-03-18 LAB — CBC WITH DIFFERENTIAL/PLATELET
Basophils Absolute: 0 10*3/uL (ref 0.0–0.1)
Basophils Relative: 0.5 % (ref 0.0–3.0)
Eosinophils Absolute: 0.1 10*3/uL (ref 0.0–0.7)
Eosinophils Relative: 2.5 % (ref 0.0–5.0)
HCT: 38.7 % (ref 36.0–46.0)
Hemoglobin: 13 g/dL (ref 12.0–15.0)
Lymphocytes Relative: 19.1 % (ref 12.0–46.0)
Lymphs Abs: 0.8 10*3/uL (ref 0.7–4.0)
MCHC: 33.5 g/dL (ref 30.0–36.0)
MCV: 98.9 fl (ref 78.0–100.0)
Monocytes Absolute: 0.3 10*3/uL (ref 0.1–1.0)
Monocytes Relative: 6.4 % (ref 3.0–12.0)
Neutro Abs: 3.1 10*3/uL (ref 1.4–7.7)
Neutrophils Relative %: 71.5 % (ref 43.0–77.0)
Platelets: 255 10*3/uL (ref 150.0–400.0)
RBC: 3.91 Mil/uL (ref 3.87–5.11)
RDW: 13.3 % (ref 11.5–15.5)
WBC: 4.4 10*3/uL (ref 4.0–10.5)

## 2022-03-18 LAB — LIPID PANEL
Cholesterol: 150 mg/dL (ref 0–200)
HDL: 65.5 mg/dL (ref >39.00)
LDL Cholesterol: 60 mg/dL (ref 0–99)
NonHDL: 84.36
Total CHOL/HDL Ratio: 2
Triglycerides: 124 mg/dL (ref 0.0–149.0)
VLDL: 24.8 mg/dL (ref 0.0–40.0)

## 2022-03-18 LAB — HEPATIC FUNCTION PANEL
ALT: 29 U/L (ref 0–35)
AST: 26 U/L (ref 0–37)
Albumin: 4.8 g/dL (ref 3.5–5.2)
Alkaline Phosphatase: 62 U/L (ref 39–117)
Bilirubin, Direct: 0.1 mg/dL (ref 0.0–0.3)
Total Bilirubin: 0.7 mg/dL (ref 0.2–1.2)
Total Protein: 6.7 g/dL (ref 6.0–8.3)

## 2022-03-18 LAB — BASIC METABOLIC PANEL
BUN: 13 mg/dL (ref 6–23)
CO2: 30 mEq/L (ref 19–32)
Calcium: 9.5 mg/dL (ref 8.4–10.5)
Chloride: 105 mEq/L (ref 96–112)
Creatinine, Ser: 0.81 mg/dL (ref 0.40–1.20)
GFR: 72.81 mL/min (ref >60.00)
Glucose, Bld: 92 mg/dL (ref 70–99)
Potassium: 4.1 mEq/L (ref 3.5–5.1)
Sodium: 142 mEq/L (ref 135–145)

## 2022-03-18 LAB — HEMOGLOBIN A1C: Hgb A1c MFr Bld: 5.7 % (ref 4.6–6.5)

## 2022-03-22 ENCOUNTER — Encounter: Payer: Self-pay | Admitting: Internal Medicine

## 2022-03-22 ENCOUNTER — Ambulatory Visit (INDEPENDENT_AMBULATORY_CARE_PROVIDER_SITE_OTHER): Payer: Medicare Other | Admitting: Internal Medicine

## 2022-03-22 VITALS — BP 124/70 | HR 67 | Temp 97.8°F | Resp 16 | Ht 66.0 in | Wt 142.6 lb

## 2022-03-22 DIAGNOSIS — Z0000 Encounter for general adult medical examination without abnormal findings: Secondary | ICD-10-CM

## 2022-03-22 DIAGNOSIS — R739 Hyperglycemia, unspecified: Secondary | ICD-10-CM

## 2022-03-22 DIAGNOSIS — E782 Mixed hyperlipidemia: Secondary | ICD-10-CM | POA: Diagnosis not present

## 2022-03-22 DIAGNOSIS — M25561 Pain in right knee: Secondary | ICD-10-CM

## 2022-03-22 DIAGNOSIS — I773 Arterial fibromuscular dysplasia: Secondary | ICD-10-CM

## 2022-03-22 DIAGNOSIS — I779 Disorder of arteries and arterioles, unspecified: Secondary | ICD-10-CM

## 2022-03-22 DIAGNOSIS — I10 Essential (primary) hypertension: Secondary | ICD-10-CM

## 2022-03-22 DIAGNOSIS — M069 Rheumatoid arthritis, unspecified: Secondary | ICD-10-CM

## 2022-03-22 DIAGNOSIS — E210 Primary hyperparathyroidism: Secondary | ICD-10-CM

## 2022-03-22 DIAGNOSIS — K219 Gastro-esophageal reflux disease without esophagitis: Secondary | ICD-10-CM

## 2022-03-22 DIAGNOSIS — I701 Atherosclerosis of renal artery: Secondary | ICD-10-CM

## 2022-03-22 MED ORDER — LOSARTAN POTASSIUM 100 MG PO TABS: 100.0000 mg | ORAL_TABLET | Freq: Every day | ORAL | 1 refills | Status: AC

## 2022-03-22 NOTE — Progress Notes (Signed)
Patient ID: Victoria Moss, female   DOB: 09-23-1950, 71 y.o.   MRN: [id number]   Subjective:    Patient ID: Victoria Moss, female    DOB: 1950-11-28, 71 y.o.   MRN: [id number]    HPI With past history of RA, hypercholesterolemia and hypertension.  She comes in today to follow up on these issues as well as for a complete physical exam. Increased stress.  Moving.  Overall she feels she is handling things relatively well.  Stays active.  No chest pain or sob reported.  No abdominal pain.  Bowels moving.  Has been having issues with her right knee.  Has f/u with [facility] 11/28.     Past Medical History:  Diagnosis Date   Allergy    Arthritis    all over and right wrist   Basal cell cancer    COVID-19 10/2020   Diverticulosis    H/O   GERD (gastroesophageal reflux disease)    Heart murmur    Hypertension    IBS (irritable bowel syndrome)    Migraine    H/O, none since stopping chocolate   Osteoporosis    PONV (postoperative nausea and vomiting)    Renal artery stenosis (HCC)    Rheumatic fever    H/O   Shingles 08/2020   Status post dilation of esophageal narrowing    Past Surgical History:  Procedure Laterality Date   APPENDECTOMY  1982   BREAST BIOPSY Right 09/2014   axillary node- neg   CATARACT EXTRACTION, BILATERAL     CESAREAN SECTION     COLONOSCOPY  2018   COLONOSCOPY     COLONOSCOPY WITH PROPOFOL N/A 08/17/2021   Procedure: COLONOSCOPY WITH PROPOFOL;  Surgeon: Lesly Rubenstein, MD;  Location: [facility];  Service: Endoscopy;  Laterality: N/A;   DILATION AND CURETTAGE OF UTERUS     x2   DILATION AND CURETTAGE, DIAGNOSTIC / THERAPEUTIC     x 2   fatty tumor     on back x2   HERNIA REPAIR     KNEE SURGERY Right    MASS EXCISION Left 07/30/2021   Procedure: EXCISION MASS, soft tissue left lumbar back;  Surgeon: Ronny Bacon, MD;  Location: [facility];  Service: General;  Laterality: Left;   MYRINGOTOMY WITH TUBE PLACEMENT Bilateral  03/24/2021   Procedure: MYRINGOTOMY WITH TUBE PLACEMENT;  Surgeon: Carloyn Manner, MD;  Location: [facility];  Service: ENT;  Laterality: Bilateral;   parathyroid gland one removed     PARATHYROIDECTOMY     SKIN CANCER EXCISION     BCCA    TOE SURGERY Left    joint replacement bit toe   TUBAL LIGATION     UMBILICAL HERNIA REPAIR     UPPER GI ENDOSCOPY     Family History  Problem Relation Age of Onset   Breast cancer Mother 30   Hypertension Mother    Stroke Mother        Late 50   Arthritis Mother    Colon polyps Mother    Cancer Mother        Breast   Hypertension Father    Hyperlipidemia Father    Dementia Father    COPD Father    Congestive Heart Failure Father    Diabetes Neg Hx    Colon cancer Neg Hx    Esophageal cancer Neg Hx    Kidney disease Neg Hx    Gallbladder disease Neg Hx    Social History  Socioeconomic History   Marital status: Widowed    Spouse name: Not on file   Number of children: 2   Years of education: Not on file   Highest education level: Not on file  Occupational History   Occupation: Retired    Fish farm manager: OTHER  Tobacco Use   Smoking status: Former    Years: 7.00    Types: Cigarettes    Quit date: 05/09/1973    Years since quitting: 48.9   Smokeless tobacco: Never   Tobacco comments:    Quit at age 20  Vaping Use   Vaping Use: Never used  Substance and Sexual Activity   Alcohol use: No    Alcohol/week: 0.0 standard drinks of alcohol   Drug use: No   Sexual activity: Not Currently  Other Topics Concern   Not on file  Social History Narrative   Not on file   Social Determinants of Health   Financial Resource Strain: Low Risk  (12/30/2021)   Overall Financial Resource Strain (CARDIA)    Difficulty of Paying Living Expenses: Not hard at all  Food Insecurity: No Food Insecurity (12/30/2021)   Hunger Vital Sign    Worried About Running Out of Food in the Last Year: Never true    [place] in the Last Year:  Never true  Transportation Needs: No Transportation Needs (12/30/2021)   PRAPARE - Hydrologist (Medical): No    Lack of Transportation (Non-Medical): No  Physical Activity: Sufficiently Active (12/30/2021)   Exercise Vital Sign    Days of Exercise per Week: 5 days    Minutes of Exercise per Session: 60 min  Stress: No Stress Concern Present (12/30/2021)   Peggs    Feeling of Stress : Only a little  Social Connections: Moderately Isolated (12/30/2021)   Social Connection and Isolation Panel [NHANES]    Frequency of Communication with Friends and Family: Once a week    Frequency of Social Gatherings with Friends and Family: Three times a week    Attends Religious Services: Never    Active Member of Clubs or Organizations: Yes    Attends Archivist Meetings: Never    Marital Status: Widowed     Review of Systems  Constitutional:  Negative for fatigue and unexpected weight change.  HENT:  Negative for congestion, sinus pressure and sore throat.   Eyes:  Negative for pain and visual disturbance.  Respiratory:  Negative for cough, chest tightness and shortness of breath.   Cardiovascular:  Negative for chest pain, palpitations and leg swelling.  Gastrointestinal:  Negative for abdominal pain, diarrhea, nausea and vomiting.  Genitourinary:  Negative for difficulty urinating and dysuria.  Musculoskeletal:  Negative for myalgias.       Seeing ortho for right knee as outlined.   Skin:  Negative for color change and rash.  Neurological:  Negative for dizziness, light-headedness and headaches.  Hematological:  Negative for adenopathy. Does not bruise/bleed easily.  Psychiatric/Behavioral:  Negative for agitation and dysphoric mood.        Objective:     BP 124/70 (BP Location: Left Arm, Patient Position: Sitting, Cuff Size: Small)   Pulse 67   Temp 97.8 F (36.6 C) (Temporal)    Resp 16   Ht _0  (1.676 m)   Wt 142 lb 9.6 oz (64.7 kg)   LMP 05/09/2006 (Approximate)   SpO2 97%   BMI 23.02 kg/m  Wt Readings from Last 3 Encounters:  03/22/22 142 lb 9.6 oz (64.7 kg)  01/05/22 138 lb 12.8 oz (63 kg)  12/30/21 139 lb (63 kg)    Physical Exam Vitals reviewed.  Constitutional:      General: She is not in acute distress.    Appearance: Normal appearance. She is well-developed.  HENT:     Head: Normocephalic and atraumatic.     Right Ear: External ear normal.     Left Ear: External ear normal.  Eyes:     General: No scleral icterus.       Right eye: No discharge.        Left eye: No discharge.     Conjunctiva/sclera: Conjunctivae normal.  Neck:     Thyroid: No thyromegaly.  Cardiovascular:     Rate and Rhythm: Normal rate and regular rhythm.  Pulmonary:     Effort: No tachypnea, accessory muscle usage or respiratory distress.     Breath sounds: Normal breath sounds. No decreased breath sounds or wheezing.  Chest:  Breasts:    Right: No inverted nipple, mass, nipple discharge or tenderness (no axillary adenopathy).     Left: No inverted nipple, mass, nipple discharge or tenderness (no axilarry adenopathy).  Abdominal:     General: Bowel sounds are normal.     Palpations: Abdomen is soft.     Tenderness: There is no abdominal tenderness.  Musculoskeletal:        General: No swelling or tenderness.     Cervical back: Neck supple. No tenderness.  Lymphadenopathy:     Cervical: No cervical adenopathy.  Skin:    Findings: No erythema or rash.  Neurological:     Mental Status: She is alert and oriented to person, place, and time.  Psychiatric:        Mood and Affect: Mood normal.        Behavior: Behavior normal.      Outpatient Encounter Medications as of 03/22/2022  Medication Sig   acetaminophen (TYLENOL) 500 MG tablet Take 1,000 mg by mouth 2 (two) times daily as needed for moderate pain or headache.   amLODipine (NORVASC) 5 MG tablet TAKE  ONE TABLET BY MOUTH EVERY DAY   Calcium Carb-Cholecalciferol (CALCIUM 600+D) 600-800 MG-UNIT TABS Take 1 tablet by mouth daily.   dicyclomine (BENTYL) 10 MG capsule TAKE 1 CAPSULE BY MOUTH ONCE DAILY AS NEEDED AS DIRECTED BY PHYSICIAN (Patient taking differently: Take 10 mg by mouth daily as needed for spasms.)   DOCOSAHEXAENOIC ACID-EPA PO Take by mouth daily.   famotidine (PEPCID) 20 MG tablet Take 20 mg by mouth every evening.   fluticasone (FLONASE) 50 MCG/ACT nasal spray Place into both nostrils daily.   folic acid (FOLVITE) 1 MG tablet Take 1 mg by mouth daily.   Melatonin 5 MG CAPS Take 5 mg by mouth at bedtime.   methotrexate (RHEUMATREX) 2.5 MG tablet Take 15 mg by mouth every Friday.   Multiple Vitamin (MULTI-VITAMINS) TABS Take 1 tablet by mouth daily.    Omega-3 Fatty Acids (FISH OIL) 1200 MG CAPS Take 1,200 mg by mouth daily.   RABEprazole (ACIPHEX) 20 MG tablet Take 20 mg by mouth daily.   rosuvastatin (CRESTOR) 5 MG tablet TAKE ONE TABLET 3 TIMES A WEEK   Turmeric Curcumin 500 MG CAPS Take 500 mg by mouth daily.   [DISCONTINUED] cephALEXin (KEFLEX) 500 MG capsule Take 1 capsule (500 mg total) by mouth 2 (two) times daily.   [DISCONTINUED] cyclobenzaprine (FLEXERIL) 5 MG  tablet TAKE ONE TABLET AT BEDTIME AS NEEDED FORMUSCLE SPASM   [DISCONTINUED] losartan (COZAAR) 100 MG tablet TAKE 1 TABLET BY MOUTH EVERY DAY   losartan (COZAAR) 100 MG tablet Take 1 tablet (100 mg total) by mouth daily.   No facility-administered encounter medications on file as of 03/22/2022.     Lab Results  Component Value Date   WBC 4.4 03/18/2022   HGB 13.0 03/18/2022   HCT 38.7 03/18/2022   PLT 255.0 03/18/2022   GLUCOSE 92 03/18/2022   CHOL 150 03/18/2022   TRIG 124.0 03/18/2022   HDL 65.50 03/18/2022   LDLDIRECT 114.0 12/17/2018   LDLCALC 60 03/18/2022   ALT 29 03/18/2022   AST 26 03/18/2022   NA 142 03/18/2022   K 4.1 03/18/2022   CL 105 03/18/2022   CREATININE 0.81 03/18/2022   BUN 13  03/18/2022   CO2 30 03/18/2022   TSH 0.73 10/07/2021   HGBA1C 5.7 03/18/2022    US Abdomen Complete  Result Date: 11/12/2021 CLINICAL DATA:  Abnormal LFTs EXAM: ABDOMEN ULTRASOUND COMPLETE COMPARISON:  None Available. FINDINGS: Gallbladder: No gallstones or wall thickening visualized. No sonographic Murphy sign noted by sonographer. Common bile duct: Diameter: 0.5 cm, within normal limits Liver: No focal lesion identified. Within normal limits in parenchymal echogenicity. Portal vein is patent on color Doppler imaging with normal direction of blood flow towards the liver. IVC: No abnormality visualized. Pancreas: Visualized portion unremarkable. Spleen: Size and appearance within normal limits. Right Kidney: Length: 10.5 cm. Echogenicity within normal limits. No hydronephrosis. Multiple small cysts a representative cyst in the midpole measures 1.8 cm. Left Kidney: Length: 10.6 cm. Echogenicity within normal limits. No hydronephrosis. Multiple small cysts. A representative parapelvic cyst measures 2.4 cm Abdominal aorta: No aneurysm visualized. Other findings: None. IMPRESSION: 1.  Unremarkable sonographic appearance of the liver. 2.  Small benign cysts in the bilateral kidneys. Electronically Signed   By: Audie Pinto M.D.   On: 11/12/2021 12:55       Assessment & Plan:   Problem List Items Addressed This Visit     Carotid artery disease ([place])    Evaluated by [facility].  Stable.  Recommended f/u prn.       Relevant Medications   losartan (COZAAR) 100 MG tablet   Fibromuscular dysplasia ([facility])    Evaluated by [facility].  Stable.  Recommended f/u prn       Relevant Medications   losartan (COZAAR) 100 MG tablet   GERD (gastroesophageal reflux disease)    Continues on aciphex.  Appears to be stable.       Health care maintenance    Physical today 03/22/22.  Mammogram 10/18/21 - Birads I.   Colonoscopy 08/17/21 -  Pathology:  COLON POLYP, ASCENDING; COLD SNARE:  TUBULAR ADENOMA.  NEGATIVE FOR  HIGH-GRADE DYSPLASIA AND MALIGNANCY.  Recommended f/u in 7 years.       Hyperglycemia    Low carb diet and exercise.  Follow met b and a1c       Hyperlipidemia - Primary    Continue crestor.  Low cholesterol diet and exercise.  Follow lipid panel.       Relevant Medications   losartan (COZAAR) 100 MG tablet   Hypertension    Continue losartan and amlodipine.  Follow pressures.  Follow metabolic panel.       Relevant Medications   losartan (COZAAR) 100 MG tablet   Primary hyperparathyroidism ([place])    Worked up by endocrinology.  Follow calcium level.  Renal artery stenosis (H[facility])    Has been evaluated by [facility].  Follow renal function.       Relevant Medications   losartan (COZAAR) 100 MG tablet   Rheumatoid arthritis ([place])    Followed by rheumatology.  On MTX.  Appears to be stable.       Right knee pain    Seeing [facility].         Einar Pheasant, MD

## 2022-03-22 NOTE — Assessment & Plan Note (Addendum)
Physical today 03/22/22.  Mammogram 10/18/21 - Birads I.   Colonoscopy 08/17/21 -  Pathology:  COLON POLYP, ASCENDING; COLD SNARE:  TUBULAR ADENOMA.  NEGATIVE FOR HIGH-GRADE DYSPLASIA AND MALIGNANCY.  Recommended f/u in 7 years.

## 2022-03-25 ENCOUNTER — Telehealth: Payer: Self-pay | Admitting: Internal Medicine

## 2022-03-25 ENCOUNTER — Other Ambulatory Visit: Payer: Self-pay

## 2022-03-25 ENCOUNTER — Encounter: Payer: Self-pay | Admitting: Internal Medicine

## 2022-03-25 MED ORDER — CYCLOBENZAPRINE HCL 5 MG PO TABS: ORAL_TABLET | ORAL | 0 refills | Status: AC

## 2022-03-25 NOTE — Telephone Encounter (Signed)
Sent to dr Nicki Reaper for signature

## 2022-03-25 NOTE — Telephone Encounter (Signed)
Patient called and said that Dr Nicki Reaper was suppose to call in her muscle relaxer. Her pharmacy does not have it. Please send.

## 2022-04-03 ENCOUNTER — Encounter: Payer: Self-pay | Admitting: Internal Medicine

## 2022-04-03 NOTE — Assessment & Plan Note (Signed)
Evaluated by [facility].  Stable.  Recommended f/u prn

## 2022-04-03 NOTE — Assessment & Plan Note (Signed)
Has been evaluated by [facility].  Follow renal function.

## 2022-04-03 NOTE — Assessment & Plan Note (Signed)
Evaluated by [facility].  Stable.  Recommended f/u prn.

## 2022-04-03 NOTE — Assessment & Plan Note (Signed)
Followed by rheumatology.  On MTX.  Appears to be stable.

## 2022-04-03 NOTE — Assessment & Plan Note (Signed)
Continue crestor.  Low cholesterol diet and exercise.  Follow lipid panel.

## 2022-04-03 NOTE — Assessment & Plan Note (Signed)
Seeing [facility].

## 2022-04-03 NOTE — Assessment & Plan Note (Signed)
Low carb diet and exercise.  Follow met b and a1c.   

## 2022-04-03 NOTE — Assessment & Plan Note (Signed)
Continues on aciphex.  Appears to be stable.

## 2022-04-03 NOTE — Assessment & Plan Note (Signed)
Worked up by endocrinology.  Follow calcium level.

## 2022-04-03 NOTE — Assessment & Plan Note (Signed)
Continue losartan and amlodipine.  Follow pressures.  Follow metabolic panel.

## 2022-04-05 DIAGNOSIS — M1711 Unilateral primary osteoarthritis, right knee: Secondary | ICD-10-CM | POA: Diagnosis not present

## 2022-04-15 ENCOUNTER — Ambulatory Visit (INDEPENDENT_AMBULATORY_CARE_PROVIDER_SITE_OTHER): Payer: Medicare Other | Admitting: Internal Medicine

## 2022-04-15 ENCOUNTER — Encounter: Payer: Self-pay | Admitting: Internal Medicine

## 2022-04-15 VITALS — BP 138/78 | HR 61 | Temp 98.6°F | Ht 66.0 in | Wt 140.8 lb

## 2022-04-15 DIAGNOSIS — J069 Acute upper respiratory infection, unspecified: Secondary | ICD-10-CM

## 2022-04-15 DIAGNOSIS — J029 Acute pharyngitis, unspecified: Secondary | ICD-10-CM

## 2022-04-15 DIAGNOSIS — I701 Atherosclerosis of renal artery: Secondary | ICD-10-CM | POA: Diagnosis not present

## 2022-04-15 LAB — POCT RAPID STREP A (OFFICE): Rapid Strep A Screen: NEGATIVE

## 2022-04-15 LAB — POCT INFLUENZA A/B

## 2022-04-15 MED ORDER — PREDNISONE 10 MG PO TABS: ORAL_TABLET | ORAL | 0 refills | Status: AC

## 2022-04-15 NOTE — Patient Instructions (Addendum)
YOU HAVE A VIRAL INFECTION.  NOT FLU OR STREP THROAT  USE AFRIN FOR A FEW DAYS TO PREVENT EUSTACHIAN TUBE SWELLING  AND EAR INFECTION   ADD PREDNISONE   IF THE EAR OR SINUS PRESSURE BUILDS UP  YOU ARE CONTAGIOUS FOR 48 MORE HOURS

## 2022-04-15 NOTE — Progress Notes (Signed)
Subjective:  Patient ID: Victoria Moss, female    DOB: [birth year]  Age: 71 y.o. MRN: [id number]  CC: The primary encounter diagnosis was Sore throat. A diagnosis of Viral URI with cough was also pertinent to this visit.   HPI Victoria Moss presents for evaluation and treatment of pharyngitis with cough  Chief Complaint  Patient presents with   Acute Visit    Swollen glands, sore throat, cough   Sore throat , PND  and cough for several days, then developed cervical LN's  yesterday.    Sick contact with a friend who travelled to [place] . Denies fevers, body aches. GI symptoms.  No dyspnea.   Outpatient Medications Prior to Visit  Medication Sig Dispense Refill   acetaminophen (TYLENOL) 500 MG tablet Take 1,000 mg by mouth 2 (two) times daily as needed for moderate pain or headache.     amLODipine (NORVASC) 5 MG tablet TAKE ONE TABLET BY MOUTH EVERY DAY 30 tablet 5   Calcium Carb-Cholecalciferol (CALCIUM 600+D) 600-800 MG-UNIT TABS Take 1 tablet by mouth daily.     cyclobenzaprine (FLEXERIL) 5 MG tablet Take one tablet at bedtime as needed for muscle spasm 30 tablet 0   dicyclomine (BENTYL) 10 MG capsule TAKE 1 CAPSULE BY MOUTH ONCE DAILY AS NEEDED AS DIRECTED BY PHYSICIAN (Patient taking differently: Take 10 mg by mouth daily as needed for spasms.) 90 capsule 1   DOCOSAHEXAENOIC ACID-EPA PO Take by mouth daily.     famotidine (PEPCID) 20 MG tablet Take 20 mg by mouth every evening.     fluticasone (FLONASE) 50 MCG/ACT nasal spray Place into both nostrils daily.     folic acid (FOLVITE) 1 MG tablet Take 1 mg by mouth daily.     losartan (COZAAR) 100 MG tablet Take 1 tablet (100 mg total) by mouth daily. 90 tablet 1   Melatonin 5 MG CAPS Take 5 mg by mouth at bedtime.     methotrexate (RHEUMATREX) 2.5 MG tablet Take 15 mg by mouth every Friday.     Multiple Vitamin (MULTI-VITAMINS) TABS Take 1 tablet by mouth daily.      Omega-3 Fatty  Acids (FISH OIL) 1200 MG CAPS Take 1,200 mg by mouth daily.     RABEprazole (ACIPHEX) 20 MG tablet Take 20 mg by mouth daily.     rosuvastatin (CRESTOR) 5 MG tablet TAKE ONE TABLET 3 TIMES A WEEK 15 tablet 5   Turmeric Curcumin 500 MG CAPS Take 500 mg by mouth daily.     No facility-administered medications prior to visit.    Review of Systems;  Patient denies headache, fevers, malaise, unintentional weight loss, skin rash, eye pain, sinus congestion and sinus pain,  dysphagia,  hemoptysis , , dyspnea, wheezing, chest pain, palpitations, orthopnea, edema, abdominal pain, nausea, melena, diarrhea, constipation, flank pain, dysuria, hematuria, urinary  Frequency, nocturia, numbness, tingling, seizures,  Focal weakness, Loss of consciousness,  Tremor, insomnia, depression, anxiety, and suicidal ideation.      Objective:  BP 138/78   Pulse 61   Temp 98.6 F (37 C) (Oral)   Ht '5\' 6"'$  (1.676 m)   Wt 140 lb 12.8 oz (63.9 kg)   LMP 05/09/2006 (Approximate)   SpO2 97%   BMI 22.73 kg/m   BP Readings from Last 3 Encounters:  04/15/22 138/78  03/22/22 124/70  01/05/22 128/78    Wt Readings from Last 3 Encounters:  04/15/22 140 lb 12.8 oz (63.9 kg)  03/22/22 142 lb 9.6 oz (64.7 kg)  01/05/22 138 lb 12.8 oz (63 kg)    General appearance: alert, cooperative and appears stated age Ears: normal TM's and external ear canals both ears Throat: lips, mucosa, and tongue normal; teeth and gums normal Neck: no adenopathy, no carotid bruit, supple, symmetrical, trachea midline and thyroid not enlarged, symmetric, no tenderness/mass/nodules Back: symmetric, no curvature. ROM normal. No CVA tenderness. Lungs: clear to auscultation bilaterally Heart: regular rate and rhythm, S1, S2 normal, no murmur, click, rub or gallop Abdomen: soft, non-tender; bowel sounds normal; no masses,  no organomegaly Pulses: 2+ and symmetric Skin: Skin color, texture, turgor normal. No rashes or lesions Lymph nodes:  Cervical, supraclavicular, and axillary nodes normal. Neuro:  awake and interactive with normal mood and affect. Higher cortical functions are normal. Speech is clear without word-finding difficulty or dysarthria. Extraocular movements are intact. Visual fields of both eyes are grossly intact. Sensation to light touch is grossly intact bilaterally of upper and lower extremities. Motor examination shows 4+/5 symmetric hand grip and upper extremity and 5/5 lower extremity strength. There is no pronation or drift. Gait is non-ataxic   Lab Results  Component Value Date   HGBA1C 5.7 03/18/2022   HGBA1C 5.6 12/31/2014    Lab Results  Component Value Date   CREATININE 0.81 03/18/2022   CREATININE 0.81 10/07/2021   CREATININE 0.72 07/27/2021    Lab Results  Component Value Date   WBC 4.4 03/18/2022   HGB 13.0 03/18/2022   HCT 38.7 03/18/2022   PLT 255.0 03/18/2022   GLUCOSE 92 03/18/2022   CHOL 150 03/18/2022   TRIG 124.0 03/18/2022   HDL 65.50 03/18/2022   LDLDIRECT 114.0 12/17/2018   LDLCALC 60 03/18/2022   ALT 29 03/18/2022   AST 26 03/18/2022   NA 142 03/18/2022   K 4.1 03/18/2022   CL 105 03/18/2022   CREATININE 0.81 03/18/2022   BUN 13 03/18/2022   CO2 30 03/18/2022   TSH 0.73 10/07/2021   HGBA1C 5.7 03/18/2022    Assessment & Plan:   Problem List Items Addressed This Visit     Viral URI with cough    COVID AND FLU RAPID TESTS were negative .  No sinus tenderness on exam / Sending prednisone for management of cough       Other Visit Diagnoses     Sore throat    -  Primary   Relevant Orders   POCT rapid strep A (Completed)   POCT Influenza A/B (Completed)       Follow-up: No follow-ups on file.   Victoria Mc, MD

## 2022-04-17 ENCOUNTER — Encounter: Payer: Self-pay | Admitting: Internal Medicine

## 2022-04-17 DIAGNOSIS — J069 Acute upper respiratory infection, unspecified: Secondary | ICD-10-CM | POA: Insufficient documentation

## 2022-04-17 NOTE — Assessment & Plan Note (Addendum)
COVID AND FLU RAPID TESTS were negative .  No sinus tenderness on exam / Sending prednisone for management of cough

## 2022-04-18 ENCOUNTER — Other Ambulatory Visit: Payer: Medicare Other

## 2022-04-19 ENCOUNTER — Other Ambulatory Visit: Payer: Self-pay | Admitting: Internal Medicine

## 2022-04-19 ENCOUNTER — Encounter: Payer: Self-pay | Admitting: Internal Medicine

## 2022-04-19 MED ORDER — LEVOFLOXACIN 500 MG PO TABS: 500.0000 mg | ORAL_TABLET | Freq: Every day | ORAL | 0 refills | Status: AC

## 2022-04-19 NOTE — Telephone Encounter (Signed)
Disregard  - msg sent to Dr Derrel Nip - instructions given

## 2022-04-19 NOTE — Telephone Encounter (Signed)
Reviewed.  Dr Derrel Nip did respond.  Per her message, can take a probiotic daily while on abx and for two weeks after

## 2022-04-21 ENCOUNTER — Ambulatory Visit: Payer: Medicare Other | Admitting: Internal Medicine

## 2023-02-21 ENCOUNTER — Other Ambulatory Visit: Payer: Self-pay | Admitting: Internal Medicine

## 2023-02-22 NOTE — Telephone Encounter (Signed)
Patient lives in [place]. Please refuse. Has new pcp local to her.

## 2023-02-22 NOTE — Telephone Encounter (Signed)
I am ok to refill, but need to schedule a f/u appt and labs.

## 2023-05-22 ENCOUNTER — Other Ambulatory Visit: Payer: Self-pay | Admitting: Internal Medicine
